# Patient Record
Sex: Male | Born: 1940
Health system: Southern US, Community
[De-identification: ages and names within clinical notes are randomized; demographics above are authoritative.]

## PROBLEM LIST (undated history)

## (undated) DIAGNOSIS — Z789 Other specified health status: Secondary | ICD-10-CM

## (undated) DIAGNOSIS — C801 Malignant (primary) neoplasm, unspecified: Secondary | ICD-10-CM

## (undated) DIAGNOSIS — I4891 Unspecified atrial fibrillation: Secondary | ICD-10-CM

## (undated) DIAGNOSIS — K5730 Diverticulosis of large intestine without perforation or abscess without bleeding: Secondary | ICD-10-CM

## (undated) DIAGNOSIS — C61 Malignant neoplasm of prostate: Secondary | ICD-10-CM

## (undated) DIAGNOSIS — Z8546 Personal history of malignant neoplasm of prostate: Secondary | ICD-10-CM

## (undated) DIAGNOSIS — M1990 Unspecified osteoarthritis, unspecified site: Secondary | ICD-10-CM

## (undated) DIAGNOSIS — Z86010 Personal history of colonic polyps: Secondary | ICD-10-CM

## (undated) DIAGNOSIS — Z923 Personal history of irradiation: Secondary | ICD-10-CM

## (undated) DIAGNOSIS — E785 Hyperlipidemia, unspecified: Secondary | ICD-10-CM

## (undated) DIAGNOSIS — G4730 Sleep apnea, unspecified: Secondary | ICD-10-CM

## (undated) HISTORY — PX: CATARACT EXTRACTION W/ INTRAOCULAR LENS  IMPLANT, BILATERAL: SHX1307

## (undated) HISTORY — DX: Diverticulosis of large intestine without perforation or abscess without bleeding: K57.30

## (undated) HISTORY — DX: Personal history of malignant neoplasm of prostate: Z85.46

## (undated) HISTORY — PX: JOINT REPLACEMENT: SHX530

## (undated) HISTORY — PX: SHOULDER ARTHROSCOPY: SHX128

## (undated) HISTORY — DX: Personal history of colonic polyps: Z86.010

## (undated) HISTORY — DX: Hyperlipidemia, unspecified: E78.5

## (undated) HISTORY — PX: COLONOSCOPY: SHX174

## (undated) HISTORY — DX: Malignant (primary) neoplasm, unspecified: C80.1

---

## 1998-11-16 ENCOUNTER — Other Ambulatory Visit: Admission: RE | Admit: 1998-11-16 | Discharge: 1998-11-16 | Payer: Self-pay | Admitting: Gastroenterology

## 2003-01-23 DIAGNOSIS — Z86010 Personal history of colonic polyps: Secondary | ICD-10-CM

## 2003-01-23 HISTORY — DX: Personal history of colonic polyps: Z86.010

## 2003-02-12 ENCOUNTER — Encounter (INDEPENDENT_AMBULATORY_CARE_PROVIDER_SITE_OTHER): Payer: Self-pay | Admitting: Gastroenterology

## 2004-10-25 ENCOUNTER — Ambulatory Visit: Admission: RE | Admit: 2004-10-25 | Discharge: 2004-11-01 | Payer: Self-pay | Admitting: Radiation Oncology

## 2004-12-28 ENCOUNTER — Ambulatory Visit: Admission: RE | Admit: 2004-12-28 | Discharge: 2005-03-07 | Payer: Self-pay | Admitting: Radiation Oncology

## 2005-03-23 ENCOUNTER — Ambulatory Visit: Admission: RE | Admit: 2005-03-23 | Discharge: 2005-06-15 | Payer: Self-pay | Admitting: Radiation Oncology

## 2006-06-27 ENCOUNTER — Ambulatory Visit: Payer: Self-pay | Admitting: Gastroenterology

## 2006-07-09 ENCOUNTER — Ambulatory Visit: Payer: Self-pay | Admitting: Gastroenterology

## 2008-10-22 ENCOUNTER — Telehealth: Payer: Self-pay | Admitting: Gastroenterology

## 2008-10-23 ENCOUNTER — Ambulatory Visit: Payer: Self-pay | Admitting: Gastroenterology

## 2008-10-23 DIAGNOSIS — K602 Anal fissure, unspecified: Secondary | ICD-10-CM | POA: Insufficient documentation

## 2008-10-23 DIAGNOSIS — K5730 Diverticulosis of large intestine without perforation or abscess without bleeding: Secondary | ICD-10-CM | POA: Insufficient documentation

## 2010-06-21 ENCOUNTER — Ambulatory Visit
Admission: RE | Admit: 2010-06-21 | Discharge: 2010-06-21 | Payer: Self-pay | Source: Home / Self Care | Attending: Surgery | Admitting: Surgery

## 2011-06-09 ENCOUNTER — Ambulatory Visit (INDEPENDENT_AMBULATORY_CARE_PROVIDER_SITE_OTHER): Payer: BC Managed Care – PPO

## 2011-06-09 DIAGNOSIS — J9801 Acute bronchospasm: Secondary | ICD-10-CM

## 2011-06-09 DIAGNOSIS — R05 Cough: Secondary | ICD-10-CM

## 2011-06-09 DIAGNOSIS — C61 Malignant neoplasm of prostate: Secondary | ICD-10-CM

## 2011-06-09 DIAGNOSIS — Z79899 Other long term (current) drug therapy: Secondary | ICD-10-CM

## 2011-09-26 ENCOUNTER — Encounter: Payer: Self-pay | Admitting: Gastroenterology

## 2011-09-26 ENCOUNTER — Ambulatory Visit (INDEPENDENT_AMBULATORY_CARE_PROVIDER_SITE_OTHER): Payer: Self-pay | Admitting: Gastroenterology

## 2011-09-26 VITALS — BP 120/72 | HR 80 | Ht 73.0 in | Wt 220.0 lb

## 2011-09-26 DIAGNOSIS — K5730 Diverticulosis of large intestine without perforation or abscess without bleeding: Secondary | ICD-10-CM

## 2011-09-26 DIAGNOSIS — Z8371 Family history of colonic polyps: Secondary | ICD-10-CM

## 2011-09-26 DIAGNOSIS — Z860100 Personal history of colon polyps, unspecified: Secondary | ICD-10-CM

## 2011-09-26 DIAGNOSIS — Z83719 Family history of colon polyps, unspecified: Secondary | ICD-10-CM

## 2011-09-26 MED ORDER — MOVIPREP 100 G PO SOLR: 1.0000 | Freq: Once | ORAL | Status: DC

## 2011-09-26 NOTE — Progress Notes (Signed)
History of Present Illness:  This is a 71 year old Caucasian male with a past history of colonic polyposis, currently asymptomatic, denying rectal bleeding, bowel irregularity, or abdominal pain. Appetite is good and his weight is stable. He denies upper gastrointestinal or hepatobiliary or general medical problems. Laboratory data routinely done and reportedly normal. He has had previous right hip replacement. Last colonoscopy in 2008 with Dr.Wilberforce revealed diverticulosis but no colonic polyposis.  I have reviewed this patient's present history, medical and surgical past history, allergies and medications.     ROS: The remainder of the 10 point ROS is negative     Physical Exam: Blood pressure 120/72, pulse 80 and regular, and BMI 29.03. General well developed well nourished patient in no acute distress, appearing his stated age Eyes PERRLA, no icterus, fundoscopic exam per opthamologist Skin no lesions noted Neck supple, no adenopathy, no thyroid enlargement, no tenderness Chest clear to percussion and auscultation Heart no significant murmurs, gallops or rubs noted Abdomen no hepatosplenomegaly masses or tenderness, BS normal. Ventral hernia noted. There is no evidence of incarceration, masses or tenderness. Extremities no acute joint lesions, edema, phlebitis or evidence of cellulitis. Neurologic patient oriented x 3, cranial nerves intact, no focal neurologic deficits noted. Psychological mental status normal and normal affect.  Assessment and plan: Apparently this patient's mother also had colon polyps. I've scheduled him for followup colonoscopy with propofol sedation at his convenience. Otherwise he is to continue medications as listed and reviewed.  Encounter Diagnoses  Name Primary?  . Personal history of colonic polyps Yes  . Diverticulosis of colon (without mention of hemorrhage)

## 2011-09-26 NOTE — Patient Instructions (Addendum)
Your procedure has been scheduled for 10/05/2011, please follow the seperate instructions.  Your prescription(s) have been sent to you pharmacy.

## 2011-09-29 ENCOUNTER — Encounter: Payer: Self-pay | Admitting: Gastroenterology

## 2011-10-05 ENCOUNTER — Ambulatory Visit (AMBULATORY_SURGERY_CENTER): Payer: BC Managed Care – PPO | Admitting: Gastroenterology

## 2011-10-05 ENCOUNTER — Encounter: Payer: Self-pay | Admitting: Gastroenterology

## 2011-10-05 VITALS — BP 130/78 | HR 69 | Temp 96.5°F | Resp 19 | Ht 73.0 in | Wt 220.0 lb

## 2011-10-05 DIAGNOSIS — Z86010 Personal history of colonic polyps: Secondary | ICD-10-CM

## 2011-10-05 DIAGNOSIS — K5730 Diverticulosis of large intestine without perforation or abscess without bleeding: Secondary | ICD-10-CM

## 2011-10-05 DIAGNOSIS — D126 Benign neoplasm of colon, unspecified: Secondary | ICD-10-CM

## 2011-10-05 DIAGNOSIS — D128 Benign neoplasm of rectum: Secondary | ICD-10-CM

## 2011-10-05 DIAGNOSIS — D129 Benign neoplasm of anus and anal canal: Secondary | ICD-10-CM

## 2011-10-05 MED ORDER — SODIUM CHLORIDE 0.9 % IV SOLN: 500.0000 mL | INTRAVENOUS | Status: DC

## 2011-10-05 NOTE — Progress Notes (Signed)
The pt tolerated the colonoscopy very well. Maw   

## 2011-10-05 NOTE — Progress Notes (Signed)
Patient did not experience any of the following events: a burn prior to discharge; a fall within the facility; wrong site/side/patient/procedure/implant event; or a hospital transfer or hospital admission upon discharge from the facility. (G8907) Patient did not have preoperative order for IV antibiotic SSI prophylaxis. (G8918)  

## 2011-10-05 NOTE — Op Note (Signed)
[facility] [address] [place], [place]  [address]  COLONOSCOPY PROCEDURE REPORT  PATIENT:  Shawn Bell, Shawn Bell  MR#:  [id number] BIRTHDATE:  21-Oct-1940, 71 yrs. old  GENDER:  male ENDOSCOPIST:  Vania Rea. Jarold Motto, MD, [facility] REF. BY: PROCEDURE DATE:  10/05/2011 PROCEDURE:  Colonoscopy with biopsy ASA CLASS:  Class II INDICATIONS:  history of polyps MEDICATIONS:   propofol (Diprivan) 250 mg IV  DESCRIPTION OF PROCEDURE:   After the risks and benefits and of the procedure were explained, informed consent was obtained. Digital rectal exam was performed and revealed no abnormalities. The LB 180AL [id number] endoscope was introduced through the anus and advanced to the cecum, which was identified by both the appendix and ileocecal valve.  The quality of the prep was excellent, using MoviPrep.  The instrument was then slowly withdrawn as the colon was fully examined. <<PROCEDUREIMAGES>>  FINDINGS:  There were mild diverticular changes in left colon. diverticulosis was found.  A lipoma was found in the sigmoid colon.  Two polyps were found in the rectum. 2 small sessile rectal polyps cold biopsy removed  This was otherwise a normal examination of the colon.   Retroflexed views in the rectum revealed no abnormalities.    The scope was then withdrawn from the patient and the procedure completed.  COMPLICATIONS:  None ENDOSCOPIC IMPRESSION: 1) Diverticulosis,mild,left sided diverticulosis 2) Lipoma in the sigmoid colon 3) Two polyps in the rectum 4) Otherwise normal examination R/O ADENOMA RECOMMENDATIONS: 1) Repeat colonoscopy in 5 years if polyp adenomatous; otherwise 10 years 2) High fiber diet.  REPEAT EXAM:  No  ______________________________ Vania Rea. Jarold Motto, MD, [facility]  CC:  Robert Bellow, MD  n. Rosalie Doctor:   Vania Rea. Lateya Dauria at 10/05/2011 02:02 PM  Franne Forts, [phone]

## 2011-10-05 NOTE — Patient Instructions (Signed)

## 2011-10-06 ENCOUNTER — Telehealth: Payer: Self-pay

## 2011-10-06 NOTE — Telephone Encounter (Signed)
Left a message on the pt's answering machine [phone] for the pt to call if any questions or concerns. Maw

## 2011-10-11 ENCOUNTER — Encounter: Payer: Self-pay | Admitting: Gastroenterology

## 2011-12-07 ENCOUNTER — Encounter: Payer: Self-pay | Admitting: Internal Medicine

## 2012-01-02 ENCOUNTER — Ambulatory Visit: Payer: BC Managed Care – PPO

## 2012-01-02 ENCOUNTER — Ambulatory Visit (INDEPENDENT_AMBULATORY_CARE_PROVIDER_SITE_OTHER): Payer: BC Managed Care – PPO | Admitting: Internal Medicine

## 2012-01-02 VITALS — BP 132/80 | HR 77 | Temp 97.0°F | Resp 18 | Ht 71.0 in | Wt 221.0 lb

## 2012-01-02 DIAGNOSIS — Z8546 Personal history of malignant neoplasm of prostate: Secondary | ICD-10-CM

## 2012-01-02 DIAGNOSIS — R195 Other fecal abnormalities: Secondary | ICD-10-CM

## 2012-01-02 DIAGNOSIS — C61 Malignant neoplasm of prostate: Secondary | ICD-10-CM

## 2012-01-02 DIAGNOSIS — Z0000 Encounter for general adult medical examination without abnormal findings: Secondary | ICD-10-CM

## 2012-01-02 DIAGNOSIS — E789 Disorder of lipoprotein metabolism, unspecified: Secondary | ICD-10-CM | POA: Insufficient documentation

## 2012-01-02 DIAGNOSIS — E785 Hyperlipidemia, unspecified: Secondary | ICD-10-CM

## 2012-01-02 DIAGNOSIS — E782 Mixed hyperlipidemia: Secondary | ICD-10-CM

## 2012-01-02 LAB — POCT URINALYSIS DIPSTICK
Ketones, UA: NEGATIVE
Protein, UA: NEGATIVE
Spec Grav, UA: 1.025
Urobilinogen, UA: 0.2
pH, UA: 5.5

## 2012-01-02 LAB — POCT UA - MICROSCOPIC ONLY
Bacteria, U Microscopic: NEGATIVE
Casts, Ur, LPF, POC: NEGATIVE
Crystals, Ur, HPF, POC: NEGATIVE
RBC, urine, microscopic: NEGATIVE

## 2012-01-02 LAB — CBC WITH DIFFERENTIAL/PLATELET
Basophils Absolute: 0 10*3/uL (ref 0.0–0.1)
Basophils Relative: 1 % (ref 0–1)
Eosinophils Absolute: 0.3 10*3/uL (ref 0.0–0.7)
Hemoglobin: 15 g/dL (ref 13.0–17.0)
MCH: 31.8 pg (ref 26.0–34.0)
MCHC: 34.6 g/dL (ref 30.0–36.0)
Monocytes Relative: 9 % (ref 3–12)
Neutro Abs: 3.3 10*3/uL (ref 1.7–7.7)
Neutrophils Relative %: 61 % (ref 43–77)
Platelets: 288 10*3/uL (ref 150–400)
RDW: 13.4 % (ref 11.5–15.5)

## 2012-01-02 LAB — LIPID PANEL
Cholesterol: 156 mg/dL (ref 0–200)
Total CHOL/HDL Ratio: 4.7 Ratio

## 2012-01-02 LAB — COMPREHENSIVE METABOLIC PANEL
AST: 19 U/L (ref 0–37)
Albumin: 4.5 g/dL (ref 3.5–5.2)
Alkaline Phosphatase: 48 U/L (ref 39–117)
BUN: 16 mg/dL (ref 6–23)
Calcium: 9.4 mg/dL (ref 8.4–10.5)
Chloride: 104 mEq/L (ref 96–112)
Potassium: 3.9 mEq/L (ref 3.5–5.3)
Sodium: 139 mEq/L (ref 135–145)
Total Protein: 6.9 g/dL (ref 6.0–8.3)

## 2012-01-02 NOTE — Progress Notes (Signed)
  Subjective:    Patient ID: Shawn Bell, male    DOB: 05/09/1941, 71 y.o.   MRN: [id number]  HPI Feels good. Scheduled for total hip September. Lipids are controlled with crestor. Prostate cancer appears controlled. See scanned hx   Review of Systems See scanned ros    Objective:   Physical Exam Normal head to toe  Hemosure positive  [facility] reading (PRIMARY) by  Dr.Ines Warf cxr ok, has cough ekg ok Results for orders placed in visit on 01/02/12  POCT UA - MICROSCOPIC ONLY      Component Value Range   WBC, Ur, HPF, POC neg     RBC, urine, microscopic neg     Bacteria, U Microscopic neg     Mucus, UA neg     Epithelial cells, urine per micros 0-3     Crystals, Ur, HPF, POC neg     Casts, Ur, LPF, POC neg     Yeast, UA neg    POCT URINALYSIS DIPSTICK      Component Value Range   Color, UA yellow     Clarity, UA clear     Glucose, UA neg     Bilirubin, UA neg     Ketones, UA neg     Spec Grav, UA 1.025     Blood, UA neg     pH, UA 5.5     Protein, UA neg     Urobilinogen, UA 0.2     Nitrite, UA neg     Leukocytes, UA Negative    IFOBT (OCCULT BLOOD)      Component Value Range   IFOBT Positive     .         Assessment & Plan:  Repeat hemosure 1 mo. See GI for endoscopy consideration

## 2012-01-03 LAB — TSH: TSH: 1.869 u[IU]/mL (ref 0.350–4.500)

## 2012-01-04 ENCOUNTER — Encounter: Payer: Self-pay | Admitting: Internal Medicine

## 2012-01-28 ENCOUNTER — Emergency Department (HOSPITAL_COMMUNITY): Payer: BC Managed Care – PPO

## 2012-01-28 ENCOUNTER — Emergency Department (HOSPITAL_COMMUNITY)
Admission: EM | Admit: 2012-01-28 | Discharge: 2012-01-28 | Disposition: A | Discharge status: Home / Self Care | Payer: BC Managed Care – PPO | Attending: Emergency Medicine | Admitting: Emergency Medicine

## 2012-01-28 ENCOUNTER — Encounter (HOSPITAL_COMMUNITY): Payer: Self-pay | Admitting: Emergency Medicine

## 2012-01-28 DIAGNOSIS — R109 Unspecified abdominal pain: Secondary | ICD-10-CM | POA: Insufficient documentation

## 2012-01-28 DIAGNOSIS — Z7982 Long term (current) use of aspirin: Secondary | ICD-10-CM | POA: Insufficient documentation

## 2012-01-28 DIAGNOSIS — K8020 Calculus of gallbladder without cholecystitis without obstruction: Secondary | ICD-10-CM

## 2012-01-28 DIAGNOSIS — E785 Hyperlipidemia, unspecified: Secondary | ICD-10-CM | POA: Insufficient documentation

## 2012-01-28 DIAGNOSIS — Z8546 Personal history of malignant neoplasm of prostate: Secondary | ICD-10-CM | POA: Insufficient documentation

## 2012-01-28 LAB — CBC WITH DIFFERENTIAL/PLATELET
HCT: 43.6 % (ref 39.0–52.0)
Hemoglobin: 15.3 g/dL (ref 13.0–17.0)
Lymphocytes Relative: 13 % (ref 12–46)
Monocytes Absolute: 0.4 10*3/uL (ref 0.1–1.0)
Monocytes Relative: 5 % (ref 3–12)
Neutro Abs: 6.6 10*3/uL (ref 1.7–7.7)
RBC: 4.72 MIL/uL (ref 4.22–5.81)
WBC: 8.3 10*3/uL (ref 4.0–10.5)

## 2012-01-28 LAB — POCT I-STAT TROPONIN I: Troponin i, poc: 0 ng/mL (ref 0.00–0.08)

## 2012-01-28 LAB — COMPREHENSIVE METABOLIC PANEL
AST: 20 U/L (ref 0–37)
Alkaline Phosphatase: 48 U/L (ref 39–117)
BUN: 14 mg/dL (ref 6–23)
CO2: 27 mEq/L (ref 19–32)
Chloride: 100 mEq/L (ref 96–112)
Creatinine, Ser: 1.13 mg/dL (ref 0.50–1.35)
GFR calc non Af Amer: 63 mL/min — ABNORMAL LOW (ref >90)
Potassium: 4.2 mEq/L (ref 3.5–5.1)
Total Bilirubin: 0.5 mg/dL (ref 0.3–1.2)

## 2012-01-28 MED ORDER — ONDANSETRON HCL 8 MG PO TABS: 8.0000 mg | ORAL_TABLET | Freq: Four times a day (QID) | ORAL | Status: AC

## 2012-01-28 MED ORDER — ASPIRIN 81 MG PO CHEW
CHEWABLE_TABLET | ORAL | Status: AC
  Administered 2012-01-28: 243 mg via ORAL
  Filled 2012-01-28: qty 3

## 2012-01-28 MED ORDER — HYDROMORPHONE HCL PF 1 MG/ML IJ SOLN
1.0000 mg | Freq: Once | INTRAMUSCULAR | Status: AC
  Administered 2012-01-28: 1 mg via INTRAVENOUS
  Filled 2012-01-28: qty 1

## 2012-01-28 MED ORDER — ONDANSETRON HCL 4 MG/2ML IJ SOLN
4.0000 mg | Freq: Once | INTRAMUSCULAR | Status: AC
  Administered 2012-01-28: 4 mg via INTRAVENOUS
  Filled 2012-01-28: qty 2

## 2012-01-28 MED ORDER — HYDROMORPHONE HCL PF 1 MG/ML IJ SOLN
INTRAMUSCULAR | Status: AC
  Administered 2012-01-28: 1 mg
  Filled 2012-01-28: qty 1

## 2012-01-28 MED ORDER — MORPHINE SULFATE 4 MG/ML IJ SOLN
4.0000 mg | Freq: Once | INTRAMUSCULAR | Status: AC
  Administered 2012-01-28: 4 mg via INTRAVENOUS
  Filled 2012-01-28: qty 1

## 2012-01-28 MED ORDER — ASPIRIN 81 MG PO CHEW
243.0000 mg | CHEWABLE_TABLET | Freq: Once | ORAL | Status: AC
  Administered 2012-01-28: 243 mg via ORAL
  Filled 2012-01-28: qty 3

## 2012-01-28 MED ORDER — NITROGLYCERIN 0.4 MG SL SUBL
SUBLINGUAL_TABLET | SUBLINGUAL | Status: AC
  Administered 2012-01-28: 19:00:00
  Filled 2012-01-28: qty 25

## 2012-01-28 MED ORDER — PERCOCET 5-325 MG PO TABS: ORAL_TABLET | ORAL | Status: DC

## 2012-01-28 NOTE — ED Notes (Signed)
Patient given discharge instructions, information, prescriptions, and diet order. Patient states that they adequately understand discharge information given and to return to ED if symptoms return or worsen.     

## 2012-01-28 NOTE — ED Notes (Signed)
Bed:WA04<BR> Expected date:<BR> Expected time:<BR> Means of arrival:<BR> Comments:<BR> Closed

## 2012-01-28 NOTE — ED Notes (Addendum)
Pt transferred to room 14 approximately 20 minutes ago. Sts he has had aspirin, nitro and viagra earlier today. Patient on monitor at this time and cycling BP x 10 minutes. Patient bolusing at this time with NS. Patient sts his pain has decreased to 6/10 from 10/10. Sts his epigastric region is painful, no masses palpated, more pain upon palpation. Denies any hx of ulcers, heartburn or cardiac problems.

## 2012-01-28 NOTE — ED Notes (Signed)
Pt w/ onset of chest pain mid sternal radiating thru to back. Self medicated Zantac, Tums, and gas-x as he thought it was indigestion. Did not have any relief. Pt self medicated at home w/ 81 mg ASA

## 2012-01-28 NOTE — ED Notes (Signed)
Pt wife leaving bedside to home. Left her phone number, cell: [phone]. Call for any changes in status or when patient is ready to go home.

## 2012-01-28 NOTE — ED Provider Notes (Signed)
History     CSN: [id number]  Arrival date & time 01/28/12  1840   First MD Initiated Contact with Patient 01/28/12 1937      Chief Complaint  Patient presents with  . Chest Pain   Level V caveat for urgent intervention for pain  (Consider location/radiation/quality/duration/timing/severity/associated sxs/prior treatment) HPI  Patient relates about 4 PM he had acute onset of epigastric abdominal pain that radiates into his back. He states it's constant and is "intense". Patient is unable to clarify his pain he's not sure if he's had nausea or not. He states he had a similar pain about a month ago and he took Zantac for it and it seemed to help.  During the course of our interview patient did reveal he had Viagra at about 1 PM. He had been given nitroglycerin by the triage nurse.  PCP Dr. Perrin Maltese  Past Medical History  Diagnosis Date  . Diverticulosis of colon (without mention of hemorrhage)   . Hemorrhoids   . Personal history of colonic polyps 01/23/2003  . Hyperlipemia   . History of prostate cancer   . Cancer     Past Surgical History  Procedure Date  . Total hip arthroplasty     right  . Shoulder arthroscopy   . Joint replacement     Family History  Problem Relation Age of Onset  . Colon cancer Neg Hx   . Colon polyps Neg Hx   . Rectal cancer Neg Hx   . Stomach cancer Neg Hx    mother of patient died age 44 she had a abdominal aortic aneurysm that was stented  History  Substance Use Topics  . Smoking status: Never Smoker   . Smokeless tobacco: Never Used  . Alcohol Use: Yes     wine daily -red  lives with spouse    Review of Systems  Unable to perform ROS: Other  All other systems reviewed and are negative.    Allergies  Review of patient's allergies indicates no known allergies.  Home Medications   Current Outpatient Rx  Name Route Sig Dispense Refill  . AMBULATORY NON FORMULARY MEDICATION  Activated [employer] as directed    . ASPIRIN 81  MG PO TABS Oral Take 81 mg by mouth daily.    . B COMPLEX PO TABS Oral Take 1 tablet by mouth daily.    Marland Kitchen FIBERCON PO Oral Take by mouth as directed.    . COQ-10 150 MG PO CAPS Oral Take 1 capsule by mouth daily.    . CENTRUM SILVER PO Oral Take 1 tablet by mouth daily.    Marland Kitchen ROSUVASTATIN CALCIUM 5 MG PO TABS Oral Take 5 mg by mouth daily.      Dispense as written.  Viagra, last dose after lunch  BP 132/83  Pulse 89  Temp 97.7 F (36.5 C) (Oral)  Resp 19  SpO2 98%  Vital signs normal    Physical Exam  Nursing note and vitals reviewed. Constitutional: He is oriented to person, place, and time. He appears well-developed and well-nourished.  Non-toxic appearance. He does not appear ill. No distress.  HENT:  Head: Normocephalic and atraumatic.  Right Ear: External ear normal.  Left Ear: External ear normal.  Nose: Nose normal. No mucosal edema or rhinorrhea.  Mouth/Throat: Oropharynx is clear and moist and mucous membranes are normal. No dental abscesses or uvula swelling.  Eyes: Conjunctivae and EOM are normal. Pupils are equal, round, and reactive to light.  Neck: Normal  range of motion and full passive range of motion without pain. Neck supple.  Cardiovascular: Normal rate, regular rhythm and normal heart sounds.  Exam reveals no gallop and no friction rub.   No murmur heard. Pulmonary/Chest: Effort normal and breath sounds normal. No respiratory distress. He has no wheezes. He has no rhonchi. He has no rales. He exhibits no tenderness and no crepitus.  Abdominal: Soft. Normal appearance and bowel sounds are normal. He exhibits no distension and no mass. There is tenderness in the epigastric area. There is no rebound and no guarding.    Musculoskeletal: Normal range of motion. He exhibits no edema and no tenderness.       Moves all extremities well.   Neurological: He is alert and oriented to person, place, and time. He has normal strength. No cranial nerve deficit.  Skin: Skin  is warm, dry and intact. No rash noted. No erythema. No pallor.  Psychiatric: He has a normal mood and affect. His speech is normal and behavior is normal. His mood appears not anxious.    ED Course  Procedures (including critical care time)   Medications  nitroGLYCERIN (NITROSTAT) 0.4 MG SL tablet (   Given 01/28/12 1857)  aspirin chewable tablet 243 mg (243 mg Oral Given 01/28/12 1901)  HYDROmorphone (DILAUDID) injection 1 mg (1 mg Intravenous Given 01/28/12 2002)  ondansetron (ZOFRAN) injection 4 mg (4 mg Intravenous Given 01/28/12 2002)  HYDROmorphone (DILAUDID) 1 MG/ML injection (1 mg  Given 01/28/12 2043)  morphine 4 MG/ML injection 4 mg (4 mg Intravenous Given 01/28/12 2241)   Pt given results of his tests and need to follow low fat diet. Pt states he is having hip replacement in September and wants to have his surgery before that.   Results for orders placed during the hospital encounter of 01/28/12  CBC WITH DIFFERENTIAL      Component Value Range   WBC 8.3  4.0 - 10.5 K/uL   RBC 4.72  4.22 - 5.81 MIL/uL   Hemoglobin 15.3  13.0 - 17.0 g/dL   HCT 14.7  82.9 - 56.2 %   MCV 92.4  78.0 - 100.0 fL   MCH 32.4  26.0 - 34.0 pg   MCHC 35.1  30.0 - 36.0 g/dL   RDW 13.0  86.5 - 78.4 %   Platelets 237  150 - 400 K/uL   Neutrophils Relative 80 (*) 43 - 77 %   Neutro Abs 6.6  1.7 - 7.7 K/uL   Lymphocytes Relative 13  12 - 46 %   Lymphs Abs 1.1  0.7 - 4.0 K/uL   Monocytes Relative 5  3 - 12 %   Monocytes Absolute 0.4  0.1 - 1.0 K/uL   Eosinophils Relative 1  0 - 5 %   Eosinophils Absolute 0.1  0.0 - 0.7 K/uL   Basophils Relative 0  0 - 1 %   Basophils Absolute 0.0  0.0 - 0.1 K/uL  COMPREHENSIVE METABOLIC PANEL      Component Value Range   Sodium 138  135 - 145 mEq/L   Potassium 4.2  3.5 - 5.1 mEq/L   Chloride 100  96 - 112 mEq/L   CO2 27  19 - 32 mEq/L   Glucose, Bld 190 (*) 70 - 99 mg/dL   BUN 14  6 - 23 mg/dL   Creatinine, Ser 6.96  0.50 - 1.35 mg/dL   Calcium 9.5  8.4 - 29.5  mg/dL   Total Protein 7.0  6.0 -  8.3 g/dL   Albumin 4.3  3.5 - 5.2 g/dL   AST 20  0 - 37 U/L   ALT 16  0 - 53 U/L   Alkaline Phosphatase 48  39 - 117 U/L   Total Bilirubin 0.5  0.3 - 1.2 mg/dL   GFR calc non Af Amer 63 (*) >90 mL/min   GFR calc Af Amer 73 (*) >90 mL/min  POCT I-STAT TROPONIN I      Component Value Range   Troponin i, poc 0.00  0.00 - 0.08 ng/mL   Comment 3           LIPASE, BLOOD      Component Value Range   Lipase 35  11 - 59 U/L  POCT I-STAT TROPONIN I      Component Value Range   Troponin i, poc 0.00  0.00 - 0.08 ng/mL   Comment 3            Laboratory interpretation all normal except for hyperglycemia  Ct Abdomen Pelvis Wo Contrast  01/28/2012  *RADIOLOGY REPORT*  Clinical Data: Abdominal pain radiating to the back.  Abdominal aortic aneurysm.  CT ABDOMEN AND PELVIS WITHOUT CONTRAST  Technique:  Multidetector CT imaging of the abdomen and pelvis was performed following the standard protocol without intravenous contrast.  Comparison: None.  Findings: Lung Bases: Dependent atelectasis at the lung bases. Coronary artery and descending thoracic aortic atherosclerosis.  Liver:  Unenhanced CT was performed per clinician order.  Lack of IV contrast limits sensitivity and specificity, especially for evaluation of abdominal/pelvic solid viscera.  Low density sub centimeter lesion in the left hepatic lobe, probably a cyst.  No biliary ductal dilation.  Spleen:  Normal.  Gallbladder:  Cholelithiasis is present.  Largest stone measures 27 mm and is partially calcified.  No CT evidence of acute cholecystitis.  Common bile duct:  Normal.  Pancreas:  Normal.  Adrenal glands:  Normal.  Kidneys:  Bilateral renal low density lesions, most compatible with cysts.  No stones or obstruction.  The ureters are within normal limits.  Stomach:  Normal appearance of the stomach.  Small bowel:  Within normal limits.  No small bowel mesenteric adenopathy.  Colon:   Colonic diverticulosis without  diverticulitis.  Pelvic Genitourinary:  Pelvis partially obscured by right total hip arthroplasty.  Prostate brachytherapy seeds.  No free fluid. Prostate produces an impression on the inferior urinary bladder.  Urinary bladder is markedly distended.  The patient may benefit from Foley catheter placement.  This raises possibility of urinary retention or bladder outflow obstruction.  Bones:  No aggressive osseous lesions.  Severe left hip osteoarthritis with subchondral cystic changes on both sides of the joint.  The right sacroiliac joint degenerative disease.  Lumbar spondylosis and scoliosis.  Right supra-acetabular ilium postoperative changes.  Vasculature: Severe aortoiliac atherosclerosis is present without aneurysm.  The common iliac arteries measure up to 18 mm.  No acute vascular abnormality is identified.  On sagittal imaging, the long axis of the bladder is 20.7 cm.  IMPRESSION: 1.  Marked distention of the urinary bladder.  Question urinary retention versus bladder outflow obstruction. The patient may benefit from Foley catheter placement. 2.  Atherosclerosis without acute vascular abnormality.  No abdominal aortic aneurysm. 3.  Renal and hepatic cysts. 4.  Cholelithiasis. 5.  Coronary artery disease.  Original Report Authenticated By: Andreas Newport, M.D.   Dg Chest Portable 1 View  01/28/2012  *RADIOLOGY REPORT*  Clinical Data: Chest and epigastric pain  PORTABLE  CHEST - 1 VIEW  Comparison: 01/02/2012  Findings: Slightly lower lung volumes. Lungs clear.  Heart size and pulmonary vascularity normal.  No effusion.  Degenerative changes in bilateral shoulders.  There is widening of the right AC joint.  IMPRESSION: No acute disease  Original Report Authenticated By: Osa Craver, M.D.       Date: 01/28/2012  Rate: 78  Rhythm: normal sinus rhythm  QRS Axis: normal  Intervals: normal  ST/T Wave abnormalities: normal  Conduction Disutrbances:IRBBB  Narrative Interpretation:   Old EKG  Reviewed: none available    1. Abdominal pain   2. Gallstones    Patient's Medications  New Prescriptions   ONDANSETRON (ZOFRAN) 8 MG TABLET    Take 1 tablet (8 mg total) by mouth every 6 (six) hours.   PERCOCET 5-325 MG PER TABLET    Take 1 or 2 po Q 6hrs for pain    Plan discharge  Devoria Albe, MD, FACEP    MDM          Ward Givens, MD 01/28/12 [phone]

## 2012-01-29 ENCOUNTER — Other Ambulatory Visit: Payer: Self-pay | Admitting: Internal Medicine

## 2012-01-29 ENCOUNTER — Telehealth (INDEPENDENT_AMBULATORY_CARE_PROVIDER_SITE_OTHER): Payer: Self-pay | Admitting: General Surgery

## 2012-01-29 DIAGNOSIS — R109 Unspecified abdominal pain: Secondary | ICD-10-CM

## 2012-01-29 DIAGNOSIS — K8020 Calculus of gallbladder without cholecystitis without obstruction: Secondary | ICD-10-CM

## 2012-01-29 NOTE — Telephone Encounter (Signed)
Patient has appt for new gallbladder evaluation on Thursday 02/01/12. He was in the ER last night. Dr Robert Bellow is asking if Dr Gerrit Friends can see patient tomorrow. I told him I saw no appts available. I offered another MD and he declined. Dr Gerrit Friends, please call Dr Perrin Maltese to discuss. [phone].

## 2012-01-30 ENCOUNTER — Encounter (INDEPENDENT_AMBULATORY_CARE_PROVIDER_SITE_OTHER): Payer: Self-pay | Admitting: Surgery

## 2012-01-30 ENCOUNTER — Ambulatory Visit (INDEPENDENT_AMBULATORY_CARE_PROVIDER_SITE_OTHER): Payer: BC Managed Care – PPO | Admitting: Surgery

## 2012-01-30 VITALS — BP 124/76 | HR 97 | Temp 97.8°F | Ht 73.0 in | Wt 218.8 lb

## 2012-01-30 DIAGNOSIS — K8010 Calculus of gallbladder with chronic cholecystitis without obstruction: Secondary | ICD-10-CM | POA: Insufficient documentation

## 2012-01-30 NOTE — Progress Notes (Signed)
General Surgery - [facility]  Chief Complaint  Patient presents with  . New Evaluation    eval gallbladder with stones - referral from Dr. Chris Guest    HISTORY: Patient is a 71-year-old white male referred by his primary care physician in the emergency department for symptomatic cholelithiasis. The patient had an initial episode of epigastric abdominal pain approximately one month ago. This lasted for approximately one hour. He denies nausea or vomiting. He denies fever. He denies jaundice. He denies acholic stools. Two days ago the patient experienced significant epigastric abdominal pain which was unrelenting.  He presented to the emergency department where he was evaluated. Cardiac issues were out. Laboratory studies showed normal liver function test. CT scan of the abdomen and pelvis demonstrated a large 27 mm gallstone within the gallbladder. No acute inflammatory findings were noted. The remainder of the study was essentially normal.  Patient has no prior history of upper abdominal surgery.  Past Medical History  Diagnosis Date  . Diverticulosis of colon (without mention of hemorrhage)   . Hemorrhoids   . Personal history of colonic polyps 01/23/2003  . Hyperlipemia   . History of prostate cancer   . Cancer      Current Outpatient Prescriptions  Medication Sig Dispense Refill  . AMBULATORY NON FORMULARY MEDICATION Activated Queretin Takes as directed      . aspirin 81 MG tablet Take 81 mg by mouth daily.      . b complex vitamins tablet Take 1 tablet by mouth daily.      . Calcium Polycarbophil (FIBERCON PO) Take by mouth as directed.      . Coenzyme Q10 (COQ-10) 150 MG CAPS Take 1 capsule by mouth daily.      . Multiple Vitamins-Minerals (CENTRUM SILVER PO) Take 1 tablet by mouth daily.      . ondansetron (ZOFRAN) 8 MG tablet Take 1 tablet (8 mg total) by mouth every 6 (six) hours.  8 tablet  0  . PERCOCET 5-325 MG per tablet Take 1 or 2 po Q 6hrs for pain   15 tablet  0  . rosuvastatin (CRESTOR) 5 MG tablet Take 5 mg by mouth daily.         No Known Allergies   Family History  Problem Relation Age of Onset  . Colon cancer Neg Hx   . Colon polyps Neg Hx   . Rectal cancer Neg Hx   . Stomach cancer Neg Hx   . Heart failure Mother      History   Social History  . Marital Status: Married    Spouse Name: N/A    Number of Children: N/A  . Years of Education: N/A   Occupational History  . Owner [employer]   Social History Main Topics  . Smoking status: Never Smoker   . Smokeless tobacco: Never Used  . Alcohol Use: Yes     wine daily -red  . Drug Use: No  . Sexually Active: None   Other Topics Concern  . None   Social History Narrative  . None     REVIEW OF SYSTEMS - PERTINENT POSITIVES ONLY: Denies jaundice. Denies acholic stools. Denies nausea and vomiting. Denies fever.  EXAM: Filed Vitals:   01/30/12 1630  BP: 124/76  Pulse: 97  Temp: 97.8 F (36.6 C)    HEENT: normocephalic; pupils equal and reactive; sclerae clear; dentition good; mucous membranes moist NECK:  symmetric on extension; no palpable anterior or posterior cervical   lymphadenopathy; no supraclavicular masses; no tenderness CHEST: clear to auscultation bilaterally without rales, rhonchi, or wheezes CARDIAC: regular rate and rhythm without significant murmur; peripheral pulses are full ABDOMEN: soft without distension; bowel sounds present; no mass; no hepatosplenomegaly; no hernia; mild epigastric tenderness EXT:  non-tender without edema; no deformity NEURO: no gross focal deficits; no sign of tremor   LABORATORY RESULTS: See [facility] ([facility]L-Epic) for most recent results   RADIOLOGY RESULTS: See [facility] (CHL-Epic) for most recent results   IMPRESSION: Symptomatic cholelithiasis  PLAN: The patient and I discussed laparoscopic cholecystectomy at length. I provided him with written ligature to review. We reviewed the  results of his CT scan and his laboratory studies.  I believe the patient is a good candidate for laparoscopic cholecystectomy. We discussed potential complications including the possibility of conversion to open surgery. We discussed the hospital stay to be anticipated and has returned to work and physical activities. He understands and wishes to proceed in the near future.  The risks and benefits of the procedure have been discussed at length with the patient.  The patient understands the proposed procedure, potential alternative treatments, and the course of recovery to be expected.  All of the patient's questions have been answered at this time.  The patient wishes to proceed with surgery.  Jamayia Croker M. Danissa Rundle, MD, FACS General & Endocrine Surgery [facility]   Visit Diagnoses: 1. Cholelithiasis with cholecystitis     Primary Care Physician: GUEST, CHRIS WARREN, MD   

## 2012-01-30 NOTE — Patient Instructions (Signed)
[facility] LAPAROSCOPIC SURGERY: POST OP INSTRUCTIONS  Always review your discharge instruction sheet given to you by the facility where your surgery was performed.  1. A prescription for pain medication may be given to you upon discharge.  Take your pain medication as prescribed.  If narcotic pain medicine is not needed, then you may take acetaminophen (Tylenol) or ibuprofen (Advil) as needed. 2. Take your usually prescribed medications unless otherwise directed. 3. If you need a refill on your pain medication, please contact your pharmacy.  They will contact our office to request authorization. Prescriptions will not be filled after 5 P.M. or on weekends. 4. You should follow a light diet the first few days after arrival home, such as soup and crackers or toast.  Be sure to include plenty of fluids daily. 5. Most patients will experience some swelling and bruising in the area of the incisions.  Ice packs will help.  Swelling and bruising can take several days to resolve.  6. It is common to experience some constipation if taking pain medication after surgery.  Increasing fluid intake and taking a stool softener (such as Colace) will usually help or prevent this problem from occurring.  A mild laxative (Milk of Magnesia or Miralax) should be taken according to package instructions if there are no bowel movements after 48 hours. 7. Unless discharge instructions indicate otherwise, you may remove your bandages 24-48 hours after surgery, and you may shower at that time.  You may have steri-strips (small skin tapes) in place directly over the incision.  These strips should be left on the skin for 7-10 days.  If your surgeon used skin glue on the incision, you may shower in 24 hours.  The glue will flake off over the next 2-3 weeks.  Any sutures or staples will be removed at the office during your follow-up visit. 8. ACTIVITIES:  You may resume regular (light) daily activities beginning  the next day-such as daily self-care, walking, climbing stairs-gradually increasing activities as tolerated.  You may have sexual intercourse when it is comfortable.  Refrain from any heavy lifting or straining until approved by your doctor. 9. You may drive when you are no longer taking prescription pain medication, you can comfortably wear a seatbelt, and you can safely maneuver your car and apply brakes. 10. You should see your doctor in the office for a follow-up appointment approximately 2-3 weeks after your surgery.  Make sure that you call for this appointment within a day or two after you arrive home to insure a convenient appointment time.  WHEN TO CALL YOUR DOCTOR: 1. Fever over 101.0 2. Inability to urinate 3. Continued bleeding from incision 4. Increased pain, redness, or drainage from the incision 5. Increasing abdominal pain  The clinic staff is available to answer your questions during regular business hours.  Please don't hesitate to call and ask to speak to one of the nurses for clinical concerns.  If you have a medical emergency, go to the nearest emergency room or call 911.  A surgeon from [facility] is always on call for the hospital. [phone] ? [phone] ? FAX [phone] Web site: [email or web address]  

## 2012-01-31 ENCOUNTER — Telehealth (INDEPENDENT_AMBULATORY_CARE_PROVIDER_SITE_OTHER): Payer: Self-pay

## 2012-01-31 NOTE — Telephone Encounter (Signed)
Pt called today still having problems with constipation.  He has been taking the Miralax bid per Dr. Ardine Eng instructions along with stool softeners and fluids.  I suggested he try Milk of Magnesia, but if nothing is working in another 24 hours to try a Fleets enema.  Pt will call back if needed.

## 2012-02-01 ENCOUNTER — Ambulatory Visit (INDEPENDENT_AMBULATORY_CARE_PROVIDER_SITE_OTHER): Payer: Self-pay | Admitting: Surgery

## 2012-02-05 ENCOUNTER — Other Ambulatory Visit (INDEPENDENT_AMBULATORY_CARE_PROVIDER_SITE_OTHER): Payer: Self-pay | Admitting: Surgery

## 2012-02-05 NOTE — Progress Notes (Signed)
Dr. Gerrit Friends - When you can, could you put orders in [facility] on Mr. Azarel [address] ? Surgery is 02/07/12 Thank you

## 2012-02-06 ENCOUNTER — Encounter (HOSPITAL_COMMUNITY): Payer: Self-pay | Admitting: Pharmacy Technician

## 2012-02-07 ENCOUNTER — Ambulatory Visit (HOSPITAL_COMMUNITY): Payer: BC Managed Care – PPO

## 2012-02-07 ENCOUNTER — Encounter (HOSPITAL_COMMUNITY): Payer: Self-pay | Admitting: *Deleted

## 2012-02-07 ENCOUNTER — Inpatient Hospital Stay (HOSPITAL_COMMUNITY)
Admission: RE | Admit: 2012-02-07 | Discharge: 2012-02-09 | DRG: 494 | Disposition: A | Discharge status: Home / Self Care | Payer: BC Managed Care – PPO | Source: Ambulatory Visit | Attending: Surgery | Admitting: Surgery

## 2012-02-07 ENCOUNTER — Encounter (HOSPITAL_COMMUNITY): Payer: Self-pay | Admitting: Anesthesiology

## 2012-02-07 ENCOUNTER — Encounter (HOSPITAL_COMMUNITY)
Admission: RE | Disposition: A | Discharge status: Home / Self Care | Payer: Self-pay | Source: Ambulatory Visit | Attending: Surgery

## 2012-02-07 ENCOUNTER — Ambulatory Visit (HOSPITAL_COMMUNITY): Payer: BC Managed Care – PPO | Admitting: Anesthesiology

## 2012-02-07 DIAGNOSIS — K8000 Calculus of gallbladder with acute cholecystitis without obstruction: Secondary | ICD-10-CM

## 2012-02-07 DIAGNOSIS — Z8546 Personal history of malignant neoplasm of prostate: Secondary | ICD-10-CM

## 2012-02-07 DIAGNOSIS — K8001 Calculus of gallbladder with acute cholecystitis with obstruction: Principal | ICD-10-CM | POA: Diagnosis present

## 2012-02-07 DIAGNOSIS — E785 Hyperlipidemia, unspecified: Secondary | ICD-10-CM | POA: Diagnosis present

## 2012-02-07 DIAGNOSIS — Z79899 Other long term (current) drug therapy: Secondary | ICD-10-CM

## 2012-02-07 DIAGNOSIS — K8010 Calculus of gallbladder with chronic cholecystitis without obstruction: Secondary | ICD-10-CM | POA: Diagnosis present

## 2012-02-07 DIAGNOSIS — Z8719 Personal history of other diseases of the digestive system: Secondary | ICD-10-CM

## 2012-02-07 HISTORY — PX: CHOLECYSTECTOMY: SHX55

## 2012-02-07 LAB — SURGICAL PCR SCREEN: MRSA, PCR: NEGATIVE

## 2012-02-07 SURGERY — LAPAROSCOPIC CHOLECYSTECTOMY WITH INTRAOPERATIVE CHOLANGIOGRAM
Anesthesia: General | Wound class: Contaminated

## 2012-02-07 MED ORDER — LACTATED RINGERS IV SOLN
INTRAVENOUS | Status: DC | PRN
  Administered 2012-02-07 (×2): via INTRAVENOUS

## 2012-02-07 MED ORDER — HYDROMORPHONE HCL PF 1 MG/ML IJ SOLN
INTRAMUSCULAR | Status: DC | PRN
  Administered 2012-02-07 (×2): 0.5 mg via INTRAVENOUS
  Administered 2012-02-07: 1 mg via INTRAVENOUS

## 2012-02-07 MED ORDER — 0.9 % SODIUM CHLORIDE (POUR BTL) OPTIME
TOPICAL | Status: DC | PRN
  Administered 2012-02-07: 1000 mL

## 2012-02-07 MED ORDER — DEXAMETHASONE SODIUM PHOSPHATE 4 MG/ML IJ SOLN
INTRAMUSCULAR | Status: DC | PRN
  Administered 2012-02-07 (×2): 5 mg via INTRAVENOUS

## 2012-02-07 MED ORDER — ONDANSETRON HCL 4 MG PO TABS: 4.0000 mg | ORAL_TABLET | Freq: Four times a day (QID) | ORAL | Status: DC | PRN

## 2012-02-07 MED ORDER — ONDANSETRON HCL 4 MG/2ML IJ SOLN: 4.0000 mg | Freq: Four times a day (QID) | INTRAMUSCULAR | Status: DC | PRN

## 2012-02-07 MED ORDER — KETOROLAC TROMETHAMINE 30 MG/ML IJ SOLN: 15.0000 mg | Freq: Once | INTRAMUSCULAR | Status: DC | PRN

## 2012-02-07 MED ORDER — LIDOCAINE HCL (CARDIAC) 20 MG/ML IV SOLN
INTRAVENOUS | Status: DC | PRN
  Administered 2012-02-07: 20 mg via INTRAVENOUS

## 2012-02-07 MED ORDER — ONDANSETRON HCL 4 MG/2ML IJ SOLN
INTRAMUSCULAR | Status: DC | PRN
  Administered 2012-02-07 (×2): 2 mg via INTRAVENOUS

## 2012-02-07 MED ORDER — CEFAZOLIN SODIUM-DEXTROSE 2-3 GM-% IV SOLR
INTRAVENOUS | Status: AC
  Filled 2012-02-07: qty 50

## 2012-02-07 MED ORDER — CEFAZOLIN SODIUM-DEXTROSE 2-3 GM-% IV SOLR
2.0000 g | INTRAVENOUS | Status: AC
  Administered 2012-02-07: 2 g via INTRAVENOUS

## 2012-02-07 MED ORDER — HYDROMORPHONE HCL PF 1 MG/ML IJ SOLN: 0.2500 mg | INTRAMUSCULAR | Status: DC | PRN

## 2012-02-07 MED ORDER — FENTANYL CITRATE 0.05 MG/ML IJ SOLN
INTRAMUSCULAR | Status: DC | PRN
  Administered 2012-02-07: 50 ug via INTRAVENOUS
  Administered 2012-02-07: 25 ug via INTRAVENOUS
  Administered 2012-02-07: 50 ug via INTRAVENOUS
  Administered 2012-02-07 (×5): 25 ug via INTRAVENOUS

## 2012-02-07 MED ORDER — HYDROMORPHONE HCL PF 1 MG/ML IJ SOLN
1.0000 mg | INTRAMUSCULAR | Status: DC | PRN
  Administered 2012-02-07 (×4): 1 mg via INTRAVENOUS
  Filled 2012-02-07 (×4): qty 1

## 2012-02-07 MED ORDER — KCL IN DEXTROSE-NACL 30-5-0.45 MEQ/L-%-% IV SOLN
INTRAVENOUS | Status: DC
  Administered 2012-02-07 – 2012-02-08 (×3): via INTRAVENOUS
  Filled 2012-02-07 (×3): qty 1000

## 2012-02-07 MED ORDER — ACETAMINOPHEN 325 MG PO TABS: 650.0000 mg | ORAL_TABLET | ORAL | Status: DC | PRN

## 2012-02-07 MED ORDER — ACETAMINOPHEN 10 MG/ML IV SOLN
INTRAVENOUS | Status: DC | PRN
  Administered 2012-02-07: 1000 mg via INTRAVENOUS

## 2012-02-07 MED ORDER — GLYCOPYRROLATE 0.2 MG/ML IJ SOLN
INTRAMUSCULAR | Status: DC | PRN
  Administered 2012-02-07: 0.4 mg via INTRAVENOUS

## 2012-02-07 MED ORDER — CISATRACURIUM BESYLATE (PF) 10 MG/5ML IV SOLN
INTRAVENOUS | Status: DC | PRN
  Administered 2012-02-07: 10 mg via INTRAVENOUS
  Administered 2012-02-07: 1 mg via INTRAVENOUS
  Administered 2012-02-07: 3 mg via INTRAVENOUS
  Administered 2012-02-07: 4 mg via INTRAVENOUS

## 2012-02-07 MED ORDER — NEOSTIGMINE METHYLSULFATE 1 MG/ML IJ SOLN
INTRAMUSCULAR | Status: DC | PRN
  Administered 2012-02-07: 3 mg via INTRAVENOUS

## 2012-02-07 MED ORDER — LACTATED RINGERS IR SOLN
Status: DC | PRN
  Administered 2012-02-07: 3000 mL

## 2012-02-07 MED ORDER — PROMETHAZINE HCL 25 MG/ML IJ SOLN: 6.2500 mg | INTRAMUSCULAR | Status: DC | PRN

## 2012-02-07 MED ORDER — HYDROCODONE-ACETAMINOPHEN 5-325 MG PO TABS
1.0000 | ORAL_TABLET | ORAL | Status: DC | PRN
  Administered 2012-02-07: 1 via ORAL
  Administered 2012-02-08 – 2012-02-09 (×7): 2 via ORAL
  Filled 2012-02-07: qty 1
  Filled 2012-02-07 (×7): qty 2

## 2012-02-07 MED ORDER — PROPOFOL 10 MG/ML IV EMUL
INTRAVENOUS | Status: DC | PRN
  Administered 2012-02-07: 200 mg via INTRAVENOUS

## 2012-02-07 MED ORDER — SODIUM CHLORIDE 0.9 % IJ SOLN
INTRAMUSCULAR | Status: DC | PRN
  Administered 2012-02-07: 10:00:00

## 2012-02-07 MED ORDER — SUCCINYLCHOLINE CHLORIDE 20 MG/ML IJ SOLN
INTRAMUSCULAR | Status: DC | PRN
  Administered 2012-02-07: 150 mg via INTRAVENOUS

## 2012-02-07 MED ORDER — IOHEXOL 300 MG/ML  SOLN
INTRAMUSCULAR | Status: AC
  Filled 2012-02-07: qty 1

## 2012-02-07 MED ORDER — MIDAZOLAM HCL 5 MG/5ML IJ SOLN
INTRAMUSCULAR | Status: DC | PRN
  Administered 2012-02-07: 0.5 mg via INTRAVENOUS

## 2012-02-07 MED ORDER — ACETAMINOPHEN 10 MG/ML IV SOLN
INTRAVENOUS | Status: AC
  Filled 2012-02-07: qty 100

## 2012-02-07 MED ORDER — BUPIVACAINE-EPINEPHRINE 0.5% -1:200000 IJ SOLN
INTRAMUSCULAR | Status: AC
  Filled 2012-02-07: qty 1

## 2012-02-07 MED ORDER — MUPIROCIN 2 % EX OINT
TOPICAL_OINTMENT | Freq: Two times a day (BID) | CUTANEOUS | Status: DC
  Administered 2012-02-07: 1 via NASAL
  Filled 2012-02-07: qty 22

## 2012-02-07 MED ORDER — BUPIVACAINE-EPINEPHRINE 0.5% -1:200000 IJ SOLN
INTRAMUSCULAR | Status: DC | PRN
  Administered 2012-02-07: 30 mL

## 2012-02-07 MED ORDER — ZOLPIDEM TARTRATE 5 MG PO TABS
5.0000 mg | ORAL_TABLET | Freq: Once | ORAL | Status: AC
  Administered 2012-02-07: 5 mg via ORAL
  Filled 2012-02-07: qty 1

## 2012-02-07 SURGICAL SUPPLY — 44 items
APL SKNCLS STERI-STRIP NONHPOA (GAUZE/BANDAGES/DRESSINGS) ×1
APPLIER CLIP ROT 10 11.4 M/L (STAPLE) ×4
APR CLP MED LRG 11.4X10 (STAPLE) ×2
BAG SPEC RTRVL LRG 6X4 10 (ENDOMECHANICALS) ×1
BENZOIN TINCTURE PRP APPL 2/3 (GAUZE/BANDAGES/DRESSINGS) ×2 IMPLANT
CABLE HIGH FREQUENCY MONO STRZ (ELECTRODE) ×2 IMPLANT
CANISTER SUCTION 2500CC (MISCELLANEOUS) ×2 IMPLANT
CHLORAPREP W/TINT 26ML (MISCELLANEOUS) ×2 IMPLANT
CLIP APPLIE ROT 10 11.4 M/L (STAPLE) ×1 IMPLANT
CLOTH BEACON ORANGE TIMEOUT ST (SAFETY) ×2 IMPLANT
COVER MAYO STAND STRL (DRAPES) ×2 IMPLANT
DECANTER SPIKE VIAL GLASS SM (MISCELLANEOUS) ×2 IMPLANT
DRAIN CHANNEL RND F F (WOUND CARE) ×1 IMPLANT
DRAPE C-ARM 42X72 X-RAY (DRAPES) ×2 IMPLANT
DRAPE LAPAROSCOPIC ABDOMINAL (DRAPES) ×2 IMPLANT
DRAPE UTILITY 15X26 (DRAPE) ×2 IMPLANT
ELECT REM PT RETURN 9FT ADLT (ELECTROSURGICAL) ×2
ELECTRODE REM PT RTRN 9FT ADLT (ELECTROSURGICAL) ×1 IMPLANT
EVACUATOR DRAINAGE 10X20 100CC (DRAIN) IMPLANT
EVACUATOR SILICONE 100CC (DRAIN) ×2
GLOVE BIOGEL PI IND STRL 7.0 (GLOVE) ×1 IMPLANT
GLOVE BIOGEL PI INDICATOR 7.0 (GLOVE) ×1
GLOVE SURG ORTHO 8.0 STRL STRW (GLOVE) ×2 IMPLANT
GOWN STRL NON-REIN LRG LVL3 (GOWN DISPOSABLE) ×2 IMPLANT
GOWN STRL REIN XL XLG (GOWN DISPOSABLE) ×4 IMPLANT
HEMOSTAT SNOW SURGICEL 2X4 (HEMOSTASIS) ×2 IMPLANT
HEMOSTAT SURGICEL 4X8 (HEMOSTASIS) IMPLANT
KIT BASIN OR (CUSTOM PROCEDURE TRAY) ×2 IMPLANT
NS IRRIG 1000ML POUR BTL (IV SOLUTION) ×2 IMPLANT
POUCH SPECIMEN RETRIEVAL 10MM (ENDOMECHANICALS) ×2 IMPLANT
SCISSORS LAP 5X35 DISP (ENDOMECHANICALS) ×1 IMPLANT
SET CHOLANGIOGRAPH MIX (MISCELLANEOUS) ×2 IMPLANT
SET IRRIG TUBING LAPAROSCOPIC (IRRIGATION / IRRIGATOR) ×2 IMPLANT
SLEEVE Z-THREAD 5X100MM (TROCAR) ×2 IMPLANT
SOLUTION ANTI FOG 6CC (MISCELLANEOUS) ×2 IMPLANT
STRIP CLOSURE SKIN 1/2X4 (GAUZE/BANDAGES/DRESSINGS) ×2 IMPLANT
SUT ETHILON 2 0 PS N (SUTURE) ×1 IMPLANT
SUT MNCRL AB 4-0 PS2 18 (SUTURE) ×2 IMPLANT
TOWEL OR 17X26 10 PK STRL BLUE (TOWEL DISPOSABLE) ×5 IMPLANT
TRAY LAP CHOLE (CUSTOM PROCEDURE TRAY) ×2 IMPLANT
TROCAR XCEL BLUNT TIP 100MML (ENDOMECHANICALS) ×2 IMPLANT
TROCAR Z-THREAD FIOS 11X100 BL (TROCAR) ×2 IMPLANT
TROCAR Z-THREAD FIOS 5X100MM (TROCAR) ×4 IMPLANT
TUBING INSUFFLATION 10FT LAP (TUBING) ×2 IMPLANT

## 2012-02-07 NOTE — H&P (View-Only) (Signed)
General Surgery [facility]  Chief Complaint  Patient presents with  . New Evaluation    eval gallbladder with stones - referral from Dr. Robert Bellow    HISTORY: Patient is a 71 year old white male referred by his primary care physician in the emergency department for symptomatic cholelithiasis. The patient had an initial episode of epigastric abdominal pain approximately one month ago. This lasted for approximately one hour. He denies nausea or vomiting. He denies fever. He denies jaundice. He denies acholic stools. Two days ago the patient experienced significant epigastric abdominal pain which was unrelenting.  He presented to the emergency department where he was evaluated. Cardiac issues were out. Laboratory studies showed normal liver function test. CT scan of the abdomen and pelvis demonstrated a large 27 mm gallstone within the gallbladder. No acute inflammatory findings were noted. The remainder of the study was essentially normal.  Patient has no prior history of upper abdominal surgery.  Past Medical History  Diagnosis Date  . Diverticulosis of colon (without mention of hemorrhage)   . Hemorrhoids   . Personal history of colonic polyps 01/23/2003  . Hyperlipemia   . History of prostate cancer   . Cancer      Current Outpatient Prescriptions  Medication Sig Dispense Refill  . AMBULATORY NON FORMULARY MEDICATION Activated [employer] as directed      . aspirin 81 MG tablet Take 81 mg by mouth daily.      Marland Kitchen b complex vitamins tablet Take 1 tablet by mouth daily.      . Calcium Polycarbophil (FIBERCON PO) Take by mouth as directed.      . Coenzyme Q10 (COQ-10) 150 MG CAPS Take 1 capsule by mouth daily.      . Multiple Vitamins-Minerals (CENTRUM SILVER PO) Take 1 tablet by mouth daily.      . ondansetron (ZOFRAN) 8 MG tablet Take 1 tablet (8 mg total) by mouth every 6 (six) hours.  8 tablet  0  . PERCOCET 5-325 MG per tablet Take 1 or 2 po Q 6hrs for pain   15 tablet  0  . rosuvastatin (CRESTOR) 5 MG tablet Take 5 mg by mouth daily.         No Known Allergies   Family History  Problem Relation Age of Onset  . Colon cancer Neg Hx   . Colon polyps Neg Hx   . Rectal cancer Neg Hx   . Stomach cancer Neg Hx   . Heart failure Mother      History   Social History  . Marital Status: Married    Spouse Name: N/A    Number of Children: N/A  . Years of Education: N/A   Occupational History  . Owner [employer]   Social History Main Topics  . Smoking status: Never Smoker   . Smokeless tobacco: Never Used  . Alcohol Use: Yes     wine daily -red  . Drug Use: No  . Sexually Active: None   Other Topics Concern  . None   Social History Narrative  . None     REVIEW OF SYSTEMS - PERTINENT POSITIVES ONLY: Denies jaundice. Denies acholic stools. Denies nausea and vomiting. Denies fever.  EXAM: Filed Vitals:   01/30/12 1630  BP: 124/76  Pulse: 97  Temp: 97.8 F (36.6 C)    HEENT: normocephalic; pupils equal and reactive; sclerae clear; dentition good; mucous membranes moist NECK:  symmetric on extension; no palpable anterior or posterior cervical  lymphadenopathy; no supraclavicular masses; no tenderness CHEST: clear to auscultation bilaterally without rales, rhonchi, or wheezes CARDIAC: regular rate and rhythm without significant murmur; peripheral pulses are full ABDOMEN: soft without distension; bowel sounds present; no mass; no hepatosplenomegaly; no hernia; mild epigastric tenderness EXT:  non-tender without edema; no deformity NEURO: no gross focal deficits; no sign of tremor   LABORATORY RESULTS: See [facility] (CHL-Epic) for most recent results   RADIOLOGY RESULTS: See [facility] (CHL-Epic) for most recent results   IMPRESSION: Symptomatic cholelithiasis  PLAN: The patient and I discussed laparoscopic cholecystectomy at length. I provided him with written ligature to review. We reviewed the  results of his CT scan and his laboratory studies.  I believe the patient is a good candidate for laparoscopic cholecystectomy. We discussed potential complications including the possibility of conversion to open surgery. We discussed the hospital stay to be anticipated and has returned to work and physical activities. He understands and wishes to proceed in the near future.  The risks and benefits of the procedure have been discussed at length with the patient.  The patient understands the proposed procedure, potential alternative treatments, and the course of recovery to be expected.  All of the patient's questions have been answered at this time.  The patient wishes to proceed with surgery.  Velora Heckler, MD, FACS General & Endocrine Surgery [facility]   Visit Diagnoses: 1. Cholelithiasis with cholecystitis     Primary Care Physician: Tally Due, MD

## 2012-02-07 NOTE — Transfer of Care (Signed)
Immediate Anesthesia Transfer of Care Note  Patient: Shawn Bell  Procedure(s) Performed: Procedure(s) (LRB): LAPAROSCOPIC CHOLECYSTECTOMY WITH INTRAOPERATIVE CHOLANGIOGRAM (N/A)  Patient Location: PACU  Anesthesia Type: General  Level of Consciousness: awake, alert , oriented and patient cooperative  Airway & Oxygen Therapy: Patient Spontanous Breathing and Patient connected to face mask oxygen  Post-op Assessment: Report given to PACU RN and Post -op Vital signs reviewed and stable  Post vital signs: Reviewed and stable  Complications: No apparent anesthesia complications

## 2012-02-07 NOTE — Anesthesia Postprocedure Evaluation (Signed)
  Anesthesia Post-op Note  Patient: Shawn Bell  Procedure(s) Performed: Procedure(s) (LRB): LAPAROSCOPIC CHOLECYSTECTOMY WITH INTRAOPERATIVE CHOLANGIOGRAM (N/A)  Patient Location: PACU  Anesthesia Type: General  Level of Consciousness: awake and alert   Airway and Oxygen Therapy: Patient Spontanous Breathing  Post-op Pain: mild  Post-op Assessment: Post-op Vital signs reviewed, Patient's Cardiovascular Status Stable, Respiratory Function Stable, Patent Airway and No signs of Nausea or vomiting  Post-op Vital Signs: stable  Complications: No apparent anesthesia complications

## 2012-02-07 NOTE — Addendum Note (Signed)
Addendum  created 02/07/12 1310 by Kypton Eltringham S Dennice Tindol, CRNA   Modules edited:Anesthesia Flowsheet, Notes Section    

## 2012-02-07 NOTE — Addendum Note (Signed)
Addendum  created 02/07/12 1310 by Valeda Malm, CRNA   Modules edited:Anesthesia Flowsheet, Notes Section

## 2012-02-07 NOTE — Progress Notes (Signed)
General Surgery [facility]  Status post lap chole.  Difficult dissection due to inflammation, posterior wall necrosis.  Drain with serosanguinous, moderate volume.  Abdomen soft.  Patient awake and alert.  Ambulated.  Voided.  Ordered regular dinner.  Continue to monitor drain output.  Check labs in AM 6/22.  Velora Heckler, MD, [facility] Office: [phone]

## 2012-02-07 NOTE — Anesthesia Preprocedure Evaluation (Signed)

## 2012-02-07 NOTE — Brief Op Note (Signed)
02/07/2012  11:24 AM  PATIENT:  Shawn Bell  71 y.o. male  PRE-OPERATIVE DIAGNOSIS:  cholelithiasis,cholecystitis  POST-OPERATIVE DIAGNOSIS:  Subacute cholecystitis, necrosis of gallbladder wall, cholelithiasis  PROCEDURE:  Laparoscopic cholecystectomy, attempted intraoperative cholangiography  SURGEON:  Velora Heckler, MD, FACS  ANESTHESIA:   general  EBL:  Total I/O In: 1500 [I.V.:1500] Out: 30 [Other:10; Blood:20]  BLOOD ADMINISTERED:none  DRAINS: (#19 Fr) Blake drain(s) in the subhepatic space   LOCAL MEDICATIONS USED:  MARCAINE     SPECIMEN:  Excision  DISPOSITION OF SPECIMEN:  PATHOLOGY  COUNTS:  YES  TOURNIQUET:  * No tourniquets in log *  DICTATION: .Other Dictation: Dictation Number [id number]  PLAN OF CARE: Admit for overnight observation  PATIENT DISPOSITION:  PACU - hemodynamically stable.   Delay start of Pharmacological VTE agent (>24hrs) due to surgical blood loss or risk of bleeding: yes  Velora Heckler, MD, [facility] Office: [phone]

## 2012-02-07 NOTE — Interval H&P Note (Signed)
History and Physical Interval Note:  02/07/2012 8:19 AM  Shawn Bell  has presented today for surgery, with the diagnosis of cholelithiasis,cholecystitis.  The various methods of treatment have been discussed with the patient and family. After consideration of risks, benefits and other options for treatment, the patient has consented to    Procedure(s) (LRB): LAPAROSCOPIC CHOLECYSTECTOMY WITH INTRAOPERATIVE CHOLANGIOGRAM (N/A) as a surgical intervention .    The patient's history has been reviewed, patient examined, no change in status, stable for surgery.  I have reviewed the patient's chart and labs.  Questions were answered to the patient's satisfaction.    Velora Heckler, MD, [facility] Office: [phone]   Corissa Oguinn Judie Petit

## 2012-02-07 NOTE — Transfer of Care (Signed)
Immediate Anesthesia Transfer of Care Note  Patient: Shawn Bell  Procedure(s) Performed: Procedure(s) (LRB): LAPAROSCOPIC CHOLECYSTECTOMY WITH INTRAOPERATIVE CHOLANGIOGRAM (N/A)  Patient Location:   Anesthesia Type:   Level of Consciousness:   Airway & Oxygen Therapy:   Post-op Assessme  Post vital signs:   Complications: duplicate note

## 2012-02-08 ENCOUNTER — Encounter (HOSPITAL_COMMUNITY): Payer: Self-pay | Admitting: Surgery

## 2012-02-08 LAB — PROTIME-INR: Prothrombin Time: 13.8 seconds (ref 11.6–15.2)

## 2012-02-08 LAB — COMPREHENSIVE METABOLIC PANEL
ALT: 78 U/L — ABNORMAL HIGH (ref 0–53)
AST: 54 U/L — ABNORMAL HIGH (ref 0–37)
Alkaline Phosphatase: 113 U/L (ref 39–117)
CO2: 25 mEq/L (ref 19–32)
Calcium: 8.7 mg/dL (ref 8.4–10.5)
Chloride: 101 mEq/L (ref 96–112)
GFR calc non Af Amer: 81 mL/min — ABNORMAL LOW (ref >90)
Potassium: 4.5 mEq/L (ref 3.5–5.1)
Sodium: 135 mEq/L (ref 135–145)
Total Bilirubin: 0.3 mg/dL (ref 0.3–1.2)

## 2012-02-08 LAB — CBC
Hemoglobin: 12.7 g/dL — ABNORMAL LOW (ref 13.0–17.0)
MCH: 31.9 pg (ref 26.0–34.0)
MCHC: 34.5 g/dL (ref 30.0–36.0)
RDW: 12 % (ref 11.5–15.5)

## 2012-02-08 MED ORDER — ZOLPIDEM TARTRATE 5 MG PO TABS
5.0000 mg | ORAL_TABLET | Freq: Once | ORAL | Status: AC
  Administered 2012-02-08: 5 mg via ORAL
  Filled 2012-02-08: qty 1

## 2012-02-08 NOTE — Progress Notes (Signed)
Patient ID: Shawn Bell, male   DOB: [birth year], 71 y.o.   MRN: [id number]  General Surgery - [facility] - Progress Note  POD# 1  Subjective: Patient comfortable.  Tolerated about half of dinner.  No nausea or emesis.  Mild pain.  Ambulating.  Objective: Vital signs in last 24 hours: Temp:  [97.5 F (36.4 C)-98.1 F (36.7 C)] 97.5 F (36.4 C) (08/22 0617) Pulse Rate:  [63-95] 72  (08/22 0617) Resp:  [10-19] 18  (08/22 0617) BP: (115-151)/(66-87) 115/66 mmHg (08/22 0617) SpO2:  [96 %-98 %] 98 % (08/22 0617) Last BM Date: 02/07/12  Intake/Output from previous day: 08/21 0701 - 08/22 0700 In: 3166.3 [P.O.:220; I.V.:2946.3] Out: 2060 [Urine:1825; Drains:205; Blood:20]  Exam: HEENT - clear, not icteric Neck - soft Chest - clear bilaterally Cor - RRR, no murmur Abd - mild distension; drain with serosanguinous - no bile Ext - no significant edema Neuro - grossly intact, no focal deficits  Lab Results:   Basename 02/08/12 0431  WBC 15.1*  HGB 12.7*  HCT 36.8*  PLT 361     Basename 02/08/12 0431  NA 135  K 4.5  CL 101  CO2 25  GLUCOSE 119*  BUN 13  CREATININE 0.93  CALCIUM 8.7    Studies/Results: Dg C-arm 61-120 Min-no Report  02/07/2012  CLINICAL DATA: gallbladder   C-ARM 61-120 MINUTES  Fluoroscopy was utilized by the requesting physician.  No radiographic  interpretation.      Assessment / Plan: 1.  Status post lap chole for subacute cholecystitis, cholelithiasis  - leave drain one more day and observe for signs of bile leak  - check labs in AM 6/23 - WBC mildly elevated, SGOT & SGPT mildly elevated today  - likely home tomorrow  Velora Heckler, MD, [facility] Office: [phone]  02/08/2012

## 2012-02-08 NOTE — Op Note (Signed)
NAMEMAHAMUD, METTS NO.:  [id number]  MEDICAL RECORD NO.:  [id number]  LOCATION:  1523                         FACILITY:  [facility]  PHYSICIAN:  Velora Heckler, MD      DATE OF BIRTH:  11/16/1940  DATE OF PROCEDURE: 07 February 2012                               OPERATIVE REPORT   PREOPERATIVE DIAGNOSIS:  Chronic cholecystitis, cholelithiasis.  POSTOPERATIVE DIAGNOSIS:  Subacute cholecystitis, necrosis of the gallbladder wall, cholelithiasis.  PROCEDURE:  Laparoscopic cholecystectomy, attempted intraoperative cholangiography.  SURGEON:  Darnell Level, MD, FACS  ANESTHESIA:  General per Dr. Eilene Ghazi.  ESTIMATED BLOOD LOSS:  150 mL.  PREPARATION:  ChloraPrep.  COMPLICATIONS:  None.  INDICATIONS:  The patient is a 71 year old white male referred by Dr. Robert Bellow for signs and symptoms of cholecystitis and cholelithiasis. The patient had 2 discrete episodes of epigastric abdominal pain over the past 6 weeks.  The 2nd episode was severe and he was seen in the emergency department.  Cardiac etiology was ruled out.  Ultrasound demonstrated cholelithiasis.  The patient was referred to general surgery for cholecystectomy.  BODY OF REPORT:  Procedure was done in OR #6 at the [facility].  The patient was brought to the operating room, placed in supine position on the operating room table.  Following administration of general anesthesia, the patient was positioned and then prepped and draped in usual strict aseptic fashion.  After ascertaining that an adequate level of anesthesia been achieved, an infraumbilical incision was made with a #15 blade.  Dissection was carried through subcutaneous tissues.  Fascia was incised in the midline and the peritoneal cavity was entered cautiously.  A 0 Vicryl pursestring suture was placed in the fascia.  An assigned cannula was introduced under direct vision and secured with a pursestring suture. Abdomen  was insufflated with carbon dioxide.  Laparoscope was introduced.  Operative ports were placed along the right costal margin in the midline, midclavicular line, and anterior axillary line.  In the right upper quadrant, there is an obvious intrahepatic gallbladder with dense adhesions of the omentum to the dome and the undersurface of the gallbladder.  With some difficulty, the omentum was mobilized off the surface of the gallbladder exposing the gallbladder wall.  Gallbladder was markedly thickened and appears inflamed.  It was tense and distended.  Using an aspirating trocar the gallbladder was evacuated.  It was then grasped and retracted cephalad.  A 30-degree scope was employed to facilitate visualization of the gallbladder neck.  Duodenum was somewhat adherent to the undersurface of the gallbladder. It was mobilized with gentle blunt dissection.  Neck of the gallbladder was then gently dissected out with blunt dissection.  The cystic duct was identified and dissected out immediately proximate to the wall of the gallbladder.  Clip was placed at the neck of the gallbladder and the cystic duct was incised, clear yellow bile emanates from the cystic duct.  Multiple attempts were made to perform intraoperative cholangiography.  A Cook cholangiography catheter was introduced through a stab wound in the right upper quadrant.  It was inserted into the cystic duct.  However,  it failed to advance.  It was repositioned and secured with a Ligaclip.  C-arm fluoroscopy was employed.  Injection of contrast, however, does not demonstrate any ductile structures and the contrast appears to pool in the right upper quadrant.  Therefore, the clip was withdrawn and the catheter was removed from the cystic duct and repositioned.  It was again injected and again appears that the contrast pools in the right upper quadrant and does not fill ductile structures. Additional attempts are unsuccessful.   Therefore, decision was made to discontinue attempts at cholangiography.  The cystic duct was then doubly clipped, and divided.  Further dissection reveals 3 separate small branches of the cystic artery going directly to the surface of the gallbladder.  These were divided between Ligaclips with the scissors. Gallbladder was then excised in the gallbladder bed using the hook electrocautery.  Dissection was difficult due to the inflammation.  The posterior wall of the gallbladder is grossly necrotic, Nagy in color, and partially discontinuous.  The remainder of the gallbladder was excised using the electrocautery for hemostasis.  The entire gallbladder was then placed into an EndoCatch bag and withdrawn through the umbilical port and submitted to pathology.  Right upper quadrant was copiously irrigated with warm saline. Hemostasis was obtained in the gallbladder bed with a hook electrocautery.  Surgicel was placed in the gallbladder bed.  A 19- French drain was placed in the right upper quadrant and brought out through the lateral port site in the right upper quadrant.  It was secured to the skin with a 3-0 nylon suture.  Drain was placed to bulb suction.  Right upper quadrant was irrigated with warm saline and evacuated.  Good hemostasis was noted.  Pneumoperitoneum was released.  Ports were removed under direct vision. Good hemostasis was noted at all port sites.  A 0 Vicryl pursestring suture was tied securely at the umbilicus.  Port sites were anesthetized with local anesthetic.  Wounds were closed with interrupted 4-0 Monocryl subcuticular sutures.  Wounds were washed and dried and benzoin and Steri-Strips were applied.  Sterile dressings were applied.  The patient was awakened from anesthesia and brought to the recovery room.  The patient tolerated the procedure well.  Velora Heckler, MD, [facility] Office: [phone]   TMG/MEDQ  D:  02/07/2012  T:   02/08/2012  Job:  [id number]  cc:   Jonita Albee, M.D. Fax: [phone]

## 2012-02-09 LAB — COMPREHENSIVE METABOLIC PANEL
ALT: 57 U/L — ABNORMAL HIGH (ref 0–53)
Alkaline Phosphatase: 99 U/L (ref 39–117)
BUN: 11 mg/dL (ref 6–23)
CO2: 28 mEq/L (ref 19–32)
GFR calc Af Amer: >90 mL/min (ref >90)
GFR calc non Af Amer: 80 mL/min — ABNORMAL LOW (ref >90)
Glucose, Bld: 90 mg/dL (ref 70–99)
Potassium: 4.3 mEq/L (ref 3.5–5.1)
Total Bilirubin: 0.3 mg/dL (ref 0.3–1.2)
Total Protein: 6.2 g/dL (ref 6.0–8.3)

## 2012-02-09 LAB — CBC
HCT: 37 % — ABNORMAL LOW (ref 39.0–52.0)
Hemoglobin: 12.5 g/dL — ABNORMAL LOW (ref 13.0–17.0)
MCHC: 33.8 g/dL (ref 30.0–36.0)
RBC: 3.95 MIL/uL — ABNORMAL LOW (ref 4.22–5.81)

## 2012-02-09 MED ORDER — HYDROCODONE-ACETAMINOPHEN 5-325 MG PO TABS: 1.0000 | ORAL_TABLET | ORAL | Status: AC | PRN

## 2012-02-09 NOTE — Care Management Note (Signed)
    Page 1 of 1   02/09/2012     12:13:51 PM   CARE MANAGEMENT NOTE 02/09/2012  Patient:  Shawn Bell, Shawn Bell   Account Number:  [id number]  Date Initiated:  02/09/2012  Documentation initiated by:  Lorenda Ishihara  Subjective/Objective Assessment:   71 yo male admitted s/p lap chole. PTA lived at home with spouse.     Action/Plan:   Anticipated DC Date:  02/09/2012   Anticipated DC Plan:  HOME/SELF CARE      DC Planning Services  CM consult      Choice offered to / List presented to:             Status of service:  Completed, signed off Medicare Important Message given?   (If response is "NO", the following Medicare IM given date fields will be blank) Date Medicare IM given:   Date Additional Medicare IM given:    Discharge Disposition:  HOME/SELF CARE  Per UR Regulation:  Reviewed for med. necessity/level of care/duration of stay  If discussed at Long Length of Stay Meetings, dates discussed:    Comments:

## 2012-02-09 NOTE — Discharge Summary (Signed)
Physician Discharge Summary [facility]  Patient ID: Shawn Bell MRN: [id number] DOB/AGE: 73-Jul-1942 73 y.o.  Admit date: 02/07/2012 Discharge date: 02/09/2012  Admission Diagnoses:  Cholecystitis, cholelithiasis   Discharge Diagnoses:  Principal Problem:  *Cholelithiasis with cholecystitis   Discharged Condition: good  Hospital Course: patient admitted after lap chole for acute cholecystitis with necrosis  Consults: None  Significant Diagnostic Studies: labs: CBC, C-MET  Treatments: surgery: lap cholecystectomy  Discharge Exam: Blood pressure 120/80, pulse 65, temperature 97.6 F (36.4 C), temperature source Oral, resp. rate 18, height 6\' 1"  (1.854 m), weight 208 lb (94.348 kg), SpO2 98.00%. HEENT - clear, no jaundice Neck - soft Chest - clear Cor - RRR Abd - drain with serous - removed; soft; minimal tenderness; flatus present Ext - no edema  Disposition: Home with family  Discharge Orders    Future Orders Please Complete By Expires   Diet - low sodium heart healthy      Increase activity slowly      Discharge instructions      Comments:   [facility] LAPAROSCOPIC SURGERY: POST OP INSTRUCTIONS  Always review your discharge instruction sheet given to you by the facility where your surgery was performed.  A prescription for pain medication may be given to you upon discharge.  Take your pain medication as prescribed.  If narcotic pain medicine is not needed, then you may take acetaminophen (Tylenol) or ibuprofen (Advil) as needed. Take your usually prescribed medications unless otherwise directed. If you need a refill on your pain medication, please contact your pharmacy.  They will contact our office to request authorization. Prescriptions will not be filled after 5 P.M. or on weekends. You should follow a light diet the first few days after arrival home, such as soup and crackers or toast.  Be sure to include plenty of  fluids daily. Most patients will experience some swelling and bruising in the area of the incisions.  Ice packs will help.  Swelling and bruising can take several days to resolve.  It is common to experience some constipation if taking pain medication after surgery.  Increasing fluid intake and taking a stool softener (such as Colace) will usually help or prevent this problem from occurring.  A mild laxative (Milk of Magnesia or Miralax) should be taken according to package instructions if there are no bowel movements after 48 hours. Unless discharge instructions indicate otherwise, you may remove your bandages 24-48 hours after surgery, and you may shower at that time.  You may have steri-strips (small skin tapes) in place directly over the incision.  These strips should be left on the skin for 7-10 days.  If your surgeon used skin glue on the incision, you may shower in 24 hours.  The glue will flake off over the next 2-3 weeks.  Any sutures or staples will be removed at the office during your follow-up visit. ACTIVITIES:  You may resume regular (light) daily activities beginning the next day-such as daily self-care, walking, climbing stairs-gradually increasing activities as tolerated.  You may have sexual intercourse when it is comfortable.  Refrain from any heavy lifting or straining until approved by your doctor. You may drive when you are no longer taking prescription pain medication, you can comfortably wear a seatbelt, and you can safely maneuver your car and apply brakes. You should see your doctor in the office for a follow-up appointment approximately 2-3 weeks after your surgery.  Make sure that you call for  this appointment within a day or two after you arrive home to insure a convenient appointment time.  WHEN TO CALL YOUR DOCTOR: Fever over 101.0 Inability to urinate Continued bleeding from incision Increased pain, redness, or drainage from the incision Increasing abdominal pain  The  clinic staff is available to answer your questions during regular business hours.  Please don't hesitate to call and ask to speak to one of the nurses for clinical concerns.  If you have a medical emergency, go to the nearest emergency room or call 911.  A surgeon from [facility] is always on call for the hospital. [phone] ? [phone] ? FAX [phone] Web site: [email or web address]   Remove dressing in 24 hours        Medication List  As of 02/09/2012 10:13 AM   TAKE these medications         AMBULATORY NON FORMULARY MEDICATION   Activated Queretin  Takes as directed      aspirin 81 MG tablet   Take 81 mg by mouth daily.      b complex vitamins tablet   Take 1 tablet by mouth daily.      CENTRUM SILVER PO   Take 1 tablet by mouth daily.      CoQ-10 150 MG Caps   Take 1 capsule by mouth daily.      FIBERCON PO   Take by mouth as directed.      HYDROcodone-acetaminophen 5-325 MG per tablet   Commonly known as: NORCO/VICODIN   Take 1-2 tablets by mouth every 4 (four) hours as needed for pain.      rosuvastatin 5 MG tablet   Commonly known as: CRESTOR   Take 5 mg by mouth at bedtime.           Follow-up Information    Follow up with Velora Heckler, MD. Call in 2 days. (for wound check in about 2 weeks)    Contact information:   [address] [place] [address] [phone]         Velora Heckler, MD, [facility] Office: [phone]   Signed: Velora Heckler 02/09/2012, 10:13 AM

## 2012-02-12 ENCOUNTER — Other Ambulatory Visit: Payer: Self-pay | Admitting: Orthopedic Surgery

## 2012-02-12 ENCOUNTER — Telehealth (INDEPENDENT_AMBULATORY_CARE_PROVIDER_SITE_OTHER): Payer: Self-pay

## 2012-02-12 MED ORDER — DEXAMETHASONE SODIUM PHOSPHATE 10 MG/ML IJ SOLN: 10.0000 mg | Freq: Once | INTRAMUSCULAR | Status: DC

## 2012-02-12 MED ORDER — BUPIVACAINE LIPOSOME 1.3 % IJ SUSP: 20.0000 mL | Freq: Once | INTRAMUSCULAR | Status: DC

## 2012-02-12 NOTE — Telephone Encounter (Signed)
PO appt given for 9-16.

## 2012-02-12 NOTE — Progress Notes (Signed)
Preoperative surgical orders have been place into the Epic hospital system for Shawn Bell on 02/12/2012, 8:28 AM  by Patrica Duel for surgery on 03/06/2012.  Preop Total Hip orders including Experel Injecion, IV Tylenol, and IV Decadron as long as there are no contraindications to the above medications. Avel Peace, PA-C

## 2012-02-23 ENCOUNTER — Encounter (HOSPITAL_COMMUNITY): Payer: Self-pay | Admitting: Pharmacy Technician

## 2012-02-29 ENCOUNTER — Inpatient Hospital Stay (HOSPITAL_COMMUNITY): Admission: RE | Admit: 2012-02-29 | Payer: BC Managed Care – PPO | Source: Ambulatory Visit

## 2012-03-01 ENCOUNTER — Encounter (HOSPITAL_COMMUNITY)
Admission: RE | Admit: 2012-03-01 | Discharge: 2012-03-01 | Disposition: A | Discharge status: Home / Self Care | Payer: BC Managed Care – PPO | Source: Ambulatory Visit | Attending: Orthopedic Surgery | Admitting: Orthopedic Surgery

## 2012-03-01 ENCOUNTER — Encounter (HOSPITAL_COMMUNITY): Payer: Self-pay

## 2012-03-01 ENCOUNTER — Ambulatory Visit (HOSPITAL_COMMUNITY)
Admission: RE | Admit: 2012-03-01 | Discharge: 2012-03-01 | Disposition: A | Discharge status: Home / Self Care | Payer: BC Managed Care – PPO | Source: Ambulatory Visit | Attending: Orthopedic Surgery | Admitting: Orthopedic Surgery

## 2012-03-01 DIAGNOSIS — M169 Osteoarthritis of hip, unspecified: Secondary | ICD-10-CM | POA: Insufficient documentation

## 2012-03-01 DIAGNOSIS — M1610 Unilateral primary osteoarthritis, unspecified hip: Secondary | ICD-10-CM | POA: Insufficient documentation

## 2012-03-01 DIAGNOSIS — Z01812 Encounter for preprocedural laboratory examination: Secondary | ICD-10-CM | POA: Insufficient documentation

## 2012-03-01 LAB — ABO/RH: ABO/RH(D): A NEG

## 2012-03-01 LAB — SURGICAL PCR SCREEN: MRSA, PCR: NEGATIVE

## 2012-03-01 NOTE — Patient Instructions (Signed)
YOUR SURGERY IS SCHEDULED ON:  WED  9/18  AT  10:45 AM  REPORT TO [facility] AT:  8:15 AM      PHONE # FOR SHORT STAY IS [phone]  DO NOT EAT OR DRINK ANYTHING AFTER MIDNIGHT THE NIGHT BEFORE YOUR SURGERY.  YOU MAY BRUSH YOUR TEETH, RINSE OUT YOUR MOUTH--BUT NO WATER, NO FOOD, NO CHEWING GUM, NO MINTS, NO CANDIES, NO CHEWING TOBACCO.  PLEASE TAKE THE FOLLOWING MEDICATIONS THE AM OF YOUR SURGERY WITH A FEW SIPS OF WATER:  NO MEDS TO TAKE    DO NOT BRING VALUABLES, MONEY, CREDIT CARDS.  CONTACT LENS, DENTURES / PARTIALS, GLASSES SHOULD NOT BE WORN TO SURGERY AND IN MOST CASES-HEARING AIDS WILL NEED TO BE REMOVED.  BRING YOUR GLASSES CASE, ANY EQUIPMENT NEEDED FOR YOUR CONTACT LENS. FOR PATIENTS ADMITTED TO THE HOSPITAL--CHECK OUT TIME THE DAY OF DISCHARGE IS 11:00 AM.  ALL INPATIENT ROOMS ARE PRIVATE - WITH BATHROOM, TELEPHONE, TELEVISION AND WIFI INTERNET. IF YOU ARE BEING DISCHARGED THE SAME DAY OF YOUR SURGERY--YOU CAN NOT DRIVE YOURSELF HOME--AND SHOULD NOT GO HOME ALONE BY TAXI OR BUS.  NO DRIVING OR OPERATING MACHINERY FOR 24 HOURS FOLLOWING ANESTHESIA / PAIN MEDICATIONS.                            SPECIAL INSTRUCTIONS:  CHLORHEXIDINE SOAP SHOWER (other brand names are Betasept and Hibiclens ) PLEASE SHOWER WITH CHLORHEXIDINE THE NIGHT BEFORE YOUR SURGERY AND THE AM OF YOUR SURGERY. DO NOT USE CHLORHEXIDINE ON YOUR FACE OR PRIVATE AREAS--YOU MAY USE YOUR NORMAL SOAP THOSE AREAS AND YOUR NORMAL SHAMPOO.  WOMEN SHOULD AVOID SHAVING UNDER ARMS AND SHAVING LEGS 48 HOURS BEFORE USING CHLORHEXIDINE TO AVOID SKIN IRRITATION.  DO NOT USE IF ALLERGIC TO CHLORHEXIDINE.  PLEASE READ OVER ANY  FACT SHEETS THAT YOU WERE GIVEN: MRSA INFORMATION, BLOOD TRANSFUSION INFORMATION, INCENTIVE SPIROMETER INFORMATION.

## 2012-03-01 NOTE — Pre-Procedure Instructions (Addendum)
PT BROUGHT COPIES OF HIS LABS THAT WERE COLLECTED 02/27/12 BY [facility]:  CBC, CMET, PT, PTT, UA.  LABS WITH IN NORMALS AND PLACED ON PT'S CHART.  PT HAS EKG AND CXR REPORTS IN EPIC FROM 01/28/12. T/S AND HIP XRAY WERE DONE TODAY AT [facility] PREOP. PREOP INSTRUCTIONS DISCUSSED WITH PT USING TEACH BACK METHOD. PT BROUGHT ENVELOPE FROM DR. ALUISIO'S OFFICE WITH HIS H&P -AND MEDICAL CLEARANCE FROM DR. GUEST--FORMS PLACED ON PT'S CHART.

## 2012-03-04 ENCOUNTER — Encounter (INDEPENDENT_AMBULATORY_CARE_PROVIDER_SITE_OTHER): Payer: Self-pay | Admitting: Surgery

## 2012-03-04 ENCOUNTER — Ambulatory Visit (INDEPENDENT_AMBULATORY_CARE_PROVIDER_SITE_OTHER): Payer: BC Managed Care – PPO | Admitting: Surgery

## 2012-03-04 VITALS — BP 132/76 | HR 59 | Temp 97.0°F | Resp 18 | Ht 73.0 in | Wt 212.0 lb

## 2012-03-04 DIAGNOSIS — K8010 Calculus of gallbladder with chronic cholecystitis without obstruction: Secondary | ICD-10-CM

## 2012-03-04 NOTE — Progress Notes (Signed)
General Surgery [facility]  Visit Diagnoses: 1. Cholelithiasis with cholecystitis     HISTORY: Patient returns for his first postoperative visit having undergone a laparoscopic cholecystectomy on 02/07/2012. Final pathology showed a necrotic gallbladder mucosa with acute and chronic inflammation and abscess formation consistent with acute full thickness suppurative cholecystitis. Gallstones were present. No evidence of malignancy.  Postoperative course has been uneventful. Patient is tolerating a regular diet. Patient is having normal bowel movements. Patient had followup laboratory studies performed 02/27/2012. This shows normal liver function test, white blood cell count 6.1, hemoglobin 14.2.  EXAM: Abdomen is soft nontender without distention. Surgical wounds are well healed. No sign of infection. No sign of herniation. No tenderness.  IMPRESSION: Status post laparoscopic cholecystectomy for acute cholecystitis and cholelithiasis  PLAN: Patient will begin applying topical creams to his incisions. He is released to full activity without restriction.  Patient is scheduled to proceed with left hip replacement surgery later this week. He is cleared for surgery from a general surgery standpoint.  Velora Heckler, MD, FACS General & Endocrine Surgery [facility]

## 2012-03-04 NOTE — Patient Instructions (Signed)
  COCOA BUTTER & VITAMIN E CREAM  (Palmer's or other brand)  Apply cocoa butter/vitamin E cream to your incision 2 - 3 times daily.  Massage cream into incision for one minute with each application.  Use sunscreen (50 SPF or higher) for first 6 months after surgery if area is exposed to sun.  You may substitute Mederma or other scar reducing creams as desired.   

## 2012-03-05 ENCOUNTER — Encounter (INDEPENDENT_AMBULATORY_CARE_PROVIDER_SITE_OTHER): Payer: Self-pay

## 2012-03-05 MED ORDER — CEFAZOLIN SODIUM 10 G IJ SOLR
3.0000 g | INTRAMUSCULAR | Status: AC
  Administered 2012-03-06: 2 g via INTRAVENOUS

## 2012-03-06 ENCOUNTER — Encounter (HOSPITAL_COMMUNITY): Payer: Self-pay | Admitting: Anesthesiology

## 2012-03-06 ENCOUNTER — Inpatient Hospital Stay (HOSPITAL_COMMUNITY): Payer: BC Managed Care – PPO | Admitting: Anesthesiology

## 2012-03-06 ENCOUNTER — Inpatient Hospital Stay (HOSPITAL_COMMUNITY): Payer: BC Managed Care – PPO

## 2012-03-06 ENCOUNTER — Encounter (HOSPITAL_COMMUNITY): Payer: Self-pay | Admitting: *Deleted

## 2012-03-06 ENCOUNTER — Encounter (HOSPITAL_COMMUNITY)
Admission: RE | Disposition: A | Discharge status: Home Health | Payer: Self-pay | Source: Ambulatory Visit | Attending: Orthopedic Surgery

## 2012-03-06 ENCOUNTER — Inpatient Hospital Stay (HOSPITAL_COMMUNITY)
Admission: RE | Admit: 2012-03-06 | Discharge: 2012-03-08 | DRG: 818 | Disposition: A | Discharge status: Home Health | Payer: BC Managed Care – PPO | Source: Ambulatory Visit | Attending: Orthopedic Surgery | Admitting: Orthopedic Surgery

## 2012-03-06 DIAGNOSIS — Z8546 Personal history of malignant neoplasm of prostate: Secondary | ICD-10-CM

## 2012-03-06 DIAGNOSIS — Z96649 Presence of unspecified artificial hip joint: Secondary | ICD-10-CM

## 2012-03-06 DIAGNOSIS — M1610 Unilateral primary osteoarthritis, unspecified hip: Principal | ICD-10-CM | POA: Diagnosis present

## 2012-03-06 DIAGNOSIS — E871 Hypo-osmolality and hyponatremia: Secondary | ICD-10-CM | POA: Diagnosis not present

## 2012-03-06 DIAGNOSIS — M169 Osteoarthritis of hip, unspecified: Principal | ICD-10-CM | POA: Diagnosis present

## 2012-03-06 HISTORY — PX: TOTAL HIP ARTHROPLASTY: SHX124

## 2012-03-06 LAB — TYPE AND SCREEN: Antibody Screen: NEGATIVE

## 2012-03-06 SURGERY — ARTHROPLASTY, HIP, TOTAL,POSTERIOR APPROACH
Anesthesia: General | Site: Hip | Laterality: Left | Wound class: Clean

## 2012-03-06 MED ORDER — ZOLPIDEM TARTRATE 5 MG PO TABS
5.0000 mg | ORAL_TABLET | Freq: Every evening | ORAL | Status: DC | PRN
  Administered 2012-03-06: 5 mg via ORAL
  Filled 2012-03-06: qty 1

## 2012-03-06 MED ORDER — LACTATED RINGERS IV SOLN
INTRAVENOUS | Status: DC
  Administered 2012-03-06: 12:00:00 via INTRAVENOUS
  Administered 2012-03-06: 1000 mL via INTRAVENOUS

## 2012-03-06 MED ORDER — METOCLOPRAMIDE HCL 10 MG PO TABS: 5.0000 mg | ORAL_TABLET | Freq: Three times a day (TID) | ORAL | Status: DC | PRN

## 2012-03-06 MED ORDER — MIDAZOLAM HCL 5 MG/5ML IJ SOLN
INTRAMUSCULAR | Status: DC | PRN
  Administered 2012-03-06: 2 mg via INTRAVENOUS

## 2012-03-06 MED ORDER — ONDANSETRON HCL 4 MG PO TABS: 4.0000 mg | ORAL_TABLET | Freq: Four times a day (QID) | ORAL | Status: DC | PRN

## 2012-03-06 MED ORDER — DEXAMETHASONE SODIUM PHOSPHATE 10 MG/ML IJ SOLN
INTRAMUSCULAR | Status: DC | PRN
  Administered 2012-03-06: 3 mg via INTRAVENOUS
  Administered 2012-03-06: 10 mg via INTRAVENOUS

## 2012-03-06 MED ORDER — ACETAMINOPHEN 325 MG PO TABS: 650.0000 mg | ORAL_TABLET | Freq: Four times a day (QID) | ORAL | Status: DC | PRN

## 2012-03-06 MED ORDER — FLEET ENEMA 7-19 GM/118ML RE ENEM: 1.0000 | ENEMA | Freq: Once | RECTAL | Status: AC | PRN

## 2012-03-06 MED ORDER — MORPHINE SULFATE 2 MG/ML IJ SOLN: 1.0000 mg | INTRAMUSCULAR | Status: DC | PRN

## 2012-03-06 MED ORDER — TRAMADOL HCL 50 MG PO TABS: 50.0000 mg | ORAL_TABLET | Freq: Four times a day (QID) | ORAL | Status: DC | PRN

## 2012-03-06 MED ORDER — MENTHOL 3 MG MT LOZG
1.0000 | LOZENGE | OROMUCOSAL | Status: DC | PRN
  Filled 2012-03-06: qty 9

## 2012-03-06 MED ORDER — DEXTROSE-NACL 5-0.9 % IV SOLN
INTRAVENOUS | Status: DC
  Administered 2012-03-06 – 2012-03-07 (×2): via INTRAVENOUS

## 2012-03-06 MED ORDER — METHOCARBAMOL 500 MG PO TABS
500.0000 mg | ORAL_TABLET | Freq: Four times a day (QID) | ORAL | Status: DC | PRN
  Administered 2012-03-06 – 2012-03-08 (×4): 500 mg via ORAL
  Filled 2012-03-06 (×5): qty 1

## 2012-03-06 MED ORDER — PHENOL 1.4 % MT LIQD
1.0000 | OROMUCOSAL | Status: DC | PRN
  Filled 2012-03-06: qty 177

## 2012-03-06 MED ORDER — MEPERIDINE HCL 50 MG/ML IJ SOLN: 6.2500 mg | INTRAMUSCULAR | Status: DC | PRN

## 2012-03-06 MED ORDER — LIDOCAINE HCL 4 % MT SOLN
OROMUCOSAL | Status: DC | PRN
  Administered 2012-03-06: 4 mL via TOPICAL

## 2012-03-06 MED ORDER — CHLORHEXIDINE GLUCONATE 4 % EX LIQD
60.0000 mL | Freq: Once | CUTANEOUS | Status: DC
  Filled 2012-03-06: qty 60

## 2012-03-06 MED ORDER — ACETAMINOPHEN 10 MG/ML IV SOLN
1000.0000 mg | Freq: Four times a day (QID) | INTRAVENOUS | Status: AC
  Administered 2012-03-06 – 2012-03-07 (×4): 1000 mg via INTRAVENOUS
  Filled 2012-03-06 (×5): qty 100

## 2012-03-06 MED ORDER — ACETAMINOPHEN 10 MG/ML IV SOLN: 1000.0000 mg | Freq: Once | INTRAVENOUS | Status: DC

## 2012-03-06 MED ORDER — OXYCODONE HCL 5 MG/5ML PO SOLN
5.0000 mg | Freq: Once | ORAL | Status: DC | PRN
  Filled 2012-03-06: qty 5

## 2012-03-06 MED ORDER — ACETAMINOPHEN 10 MG/ML IV SOLN: 1000.0000 mg | Freq: Once | INTRAVENOUS | Status: DC | PRN

## 2012-03-06 MED ORDER — METOCLOPRAMIDE HCL 5 MG/ML IJ SOLN: 5.0000 mg | Freq: Three times a day (TID) | INTRAMUSCULAR | Status: DC | PRN

## 2012-03-06 MED ORDER — HYDROMORPHONE HCL PF 1 MG/ML IJ SOLN
INTRAMUSCULAR | Status: AC
  Filled 2012-03-06: qty 2

## 2012-03-06 MED ORDER — ONDANSETRON HCL 4 MG/2ML IJ SOLN
INTRAMUSCULAR | Status: DC | PRN
  Administered 2012-03-06: 4 mg via INTRAVENOUS

## 2012-03-06 MED ORDER — POLYETHYLENE GLYCOL 3350 17 G PO PACK: 17.0000 g | PACK | Freq: Every day | ORAL | Status: DC | PRN

## 2012-03-06 MED ORDER — BISACODYL 10 MG RE SUPP: 10.0000 mg | Freq: Every day | RECTAL | Status: DC | PRN

## 2012-03-06 MED ORDER — RIVAROXABAN 10 MG PO TABS
10.0000 mg | ORAL_TABLET | Freq: Every day | ORAL | Status: DC
  Administered 2012-03-07 – 2012-03-08 (×2): 10 mg via ORAL
  Filled 2012-03-06 (×3): qty 1

## 2012-03-06 MED ORDER — OXYCODONE HCL 5 MG PO TABS: 5.0000 mg | ORAL_TABLET | Freq: Once | ORAL | Status: DC | PRN

## 2012-03-06 MED ORDER — DOCUSATE SODIUM 100 MG PO CAPS
100.0000 mg | ORAL_CAPSULE | Freq: Two times a day (BID) | ORAL | Status: DC
  Administered 2012-03-06 – 2012-03-08 (×5): 100 mg via ORAL

## 2012-03-06 MED ORDER — DIPHENHYDRAMINE HCL 12.5 MG/5ML PO ELIX: 12.5000 mg | ORAL_SOLUTION | ORAL | Status: DC | PRN

## 2012-03-06 MED ORDER — SODIUM CHLORIDE 0.9 % IV SOLN: INTRAVENOUS | Status: DC

## 2012-03-06 MED ORDER — ZOLPIDEM TARTRATE 5 MG PO TABS
5.0000 mg | ORAL_TABLET | ORAL | Status: AC
  Administered 2012-03-06: 5 mg via ORAL
  Filled 2012-03-06: qty 1

## 2012-03-06 MED ORDER — ATORVASTATIN CALCIUM 10 MG PO TABS
10.0000 mg | ORAL_TABLET | Freq: Every day | ORAL | Status: DC
  Administered 2012-03-06 – 2012-03-07 (×2): 10 mg via ORAL
  Filled 2012-03-06 (×3): qty 1

## 2012-03-06 MED ORDER — ROCURONIUM BROMIDE 100 MG/10ML IV SOLN
INTRAVENOUS | Status: DC | PRN
  Administered 2012-03-06: 50 mg via INTRAVENOUS

## 2012-03-06 MED ORDER — GLYCOPYRROLATE 0.2 MG/ML IJ SOLN
INTRAMUSCULAR | Status: DC | PRN
  Administered 2012-03-06: .4 mg via INTRAVENOUS
  Administered 2012-03-06: 0.2 mg via INTRAVENOUS

## 2012-03-06 MED ORDER — PROMETHAZINE HCL 25 MG/ML IJ SOLN: 6.2500 mg | INTRAMUSCULAR | Status: DC | PRN

## 2012-03-06 MED ORDER — HETASTARCH-ELECTROLYTES 6 % IV SOLN
INTRAVENOUS | Status: DC | PRN
  Administered 2012-03-06: 12:00:00 via INTRAVENOUS

## 2012-03-06 MED ORDER — LIDOCAINE HCL (CARDIAC) 20 MG/ML IV SOLN
INTRAVENOUS | Status: DC | PRN
  Administered 2012-03-06: 80 mg via INTRAVENOUS

## 2012-03-06 MED ORDER — PROPOFOL 10 MG/ML IV BOLUS
INTRAVENOUS | Status: DC | PRN
  Administered 2012-03-06: 200 mg via INTRAVENOUS

## 2012-03-06 MED ORDER — ONDANSETRON HCL 4 MG/2ML IJ SOLN: 4.0000 mg | Freq: Four times a day (QID) | INTRAMUSCULAR | Status: DC | PRN

## 2012-03-06 MED ORDER — METHOCARBAMOL 100 MG/ML IJ SOLN
500.0000 mg | Freq: Four times a day (QID) | INTRAMUSCULAR | Status: DC | PRN
  Administered 2012-03-06: 500 mg via INTRAVENOUS
  Filled 2012-03-06 (×2): qty 5

## 2012-03-06 MED ORDER — BUPIVACAINE LIPOSOME 1.3 % IJ SUSP
20.0000 mL | Freq: Once | INTRAMUSCULAR | Status: AC
  Administered 2012-03-06: 20 mL
  Filled 2012-03-06: qty 20

## 2012-03-06 MED ORDER — ACETAMINOPHEN 10 MG/ML IV SOLN
INTRAVENOUS | Status: DC | PRN
  Administered 2012-03-06: 1000 mg via INTRAVENOUS

## 2012-03-06 MED ORDER — ACETAMINOPHEN 650 MG RE SUPP: 650.0000 mg | Freq: Four times a day (QID) | RECTAL | Status: DC | PRN

## 2012-03-06 MED ORDER — SUFENTANIL CITRATE 50 MCG/ML IV SOLN
INTRAVENOUS | Status: DC | PRN
  Administered 2012-03-06 (×4): 10 ug via INTRAVENOUS

## 2012-03-06 MED ORDER — HYDROMORPHONE HCL PF 1 MG/ML IJ SOLN
0.2500 mg | INTRAMUSCULAR | Status: DC | PRN
  Administered 2012-03-06 (×4): 0.5 mg via INTRAVENOUS

## 2012-03-06 MED ORDER — OXYCODONE HCL 5 MG PO TABS
5.0000 mg | ORAL_TABLET | ORAL | Status: DC | PRN
  Administered 2012-03-06: 5 mg via ORAL
  Administered 2012-03-06 – 2012-03-08 (×9): 10 mg via ORAL
  Filled 2012-03-06 (×11): qty 2

## 2012-03-06 MED ORDER — NEOSTIGMINE METHYLSULFATE 1 MG/ML IJ SOLN
INTRAMUSCULAR | Status: DC | PRN
  Administered 2012-03-06: 3 mg via INTRAVENOUS

## 2012-03-06 MED ORDER — CEFAZOLIN SODIUM-DEXTROSE 2-3 GM-% IV SOLR
2.0000 g | Freq: Four times a day (QID) | INTRAVENOUS | Status: AC
  Administered 2012-03-06 (×2): 2 g via INTRAVENOUS
  Filled 2012-03-06 (×2): qty 50

## 2012-03-06 MED ORDER — 0.9 % SODIUM CHLORIDE (POUR BTL) OPTIME
TOPICAL | Status: DC | PRN
  Administered 2012-03-06: 1000 mL

## 2012-03-06 SURGICAL SUPPLY — 49 items
BAG SPEC THK2 15X12 ZIP CLS (MISCELLANEOUS) ×1
BAG ZIPLOCK 12X15 (MISCELLANEOUS) ×2 IMPLANT
BIT DRILL 2.8X128 (BIT) ×2 IMPLANT
BLADE EXTENDED COATED 6.5IN (ELECTRODE) ×2 IMPLANT
BLADE SAW SAG 73X25 THK (BLADE) ×1
BLADE SAW SGTL 73X25 THK (BLADE) ×1 IMPLANT
CLOTH BEACON ORANGE TIMEOUT ST (SAFETY) ×2 IMPLANT
DECANTER SPIKE VIAL GLASS SM (MISCELLANEOUS) ×2 IMPLANT
DRAPE INCISE IOBAN 66X45 STRL (DRAPES) ×2 IMPLANT
DRAPE ORTHO SPLIT 77X108 STRL (DRAPES) ×4
DRAPE POUCH INSTRU U-SHP 10X18 (DRAPES) ×2 IMPLANT
DRAPE SURG ORHT 6 SPLT 77X108 (DRAPES) ×2 IMPLANT
DRAPE U-SHAPE 47X51 STRL (DRAPES) ×2 IMPLANT
DRSG ADAPTIC 3X8 NADH LF (GAUZE/BANDAGES/DRESSINGS) ×2 IMPLANT
DRSG MEPILEX BORDER 4X4 (GAUZE/BANDAGES/DRESSINGS) ×2 IMPLANT
DRSG MEPILEX BORDER 4X8 (GAUZE/BANDAGES/DRESSINGS) ×2 IMPLANT
DURAPREP 26ML APPLICATOR (WOUND CARE) ×2 IMPLANT
ELECT REM PT RETURN 9FT ADLT (ELECTROSURGICAL) ×2
ELECTRODE REM PT RTRN 9FT ADLT (ELECTROSURGICAL) ×1 IMPLANT
EVACUATOR 1/8 PVC DRAIN (DRAIN) ×2 IMPLANT
FACESHIELD LNG OPTICON STERILE (SAFETY) ×8 IMPLANT
GLOVE BIO SURGEON STRL SZ8 (GLOVE) ×2 IMPLANT
GLOVE BIOGEL PI IND STRL 8 (GLOVE) ×2 IMPLANT
GLOVE BIOGEL PI INDICATOR 8 (GLOVE) ×2
GLOVE ECLIPSE 8.0 STRL XLNG CF (GLOVE) ×2 IMPLANT
GOWN STRL NON-REIN LRG LVL3 (GOWN DISPOSABLE) ×4 IMPLANT
GOWN STRL REIN XL XLG (GOWN DISPOSABLE) ×2 IMPLANT
IMMOBILIZER KNEE 20 (SOFTGOODS) ×2
IMMOBILIZER KNEE 20 THIGH 36 (SOFTGOODS) IMPLANT
KIT BASIN OR (CUSTOM PROCEDURE TRAY) ×2 IMPLANT
MANIFOLD NEPTUNE II (INSTRUMENTS) ×2 IMPLANT
NDL SAFETY ECLIPSE 18X1.5 (NEEDLE) ×1 IMPLANT
NEEDLE HYPO 18GX1.5 SHARP (NEEDLE) ×2
NS IRRIG 1000ML POUR BTL (IV SOLUTION) ×2 IMPLANT
PACK TOTAL JOINT (CUSTOM PROCEDURE TRAY) ×2 IMPLANT
PASSER SUT SWANSON 36MM LOOP (INSTRUMENTS) ×2 IMPLANT
POSITIONER SURGICAL ARM (MISCELLANEOUS) ×2 IMPLANT
SPONGE GAUZE 4X4 12PLY (GAUZE/BANDAGES/DRESSINGS) ×2 IMPLANT
STRIP CLOSURE SKIN 1/2X4 (GAUZE/BANDAGES/DRESSINGS) ×4 IMPLANT
SUT ETHIBOND NAB CT1 #1 30IN (SUTURE) ×4 IMPLANT
SUT MNCRL AB 4-0 PS2 18 (SUTURE) ×2 IMPLANT
SUT VIC AB 2-0 CT1 27 (SUTURE) ×6
SUT VIC AB 2-0 CT1 TAPERPNT 27 (SUTURE) ×3 IMPLANT
SUT VLOC 180 0 24IN GS25 (SUTURE) ×3 IMPLANT
SYR 50ML LL SCALE MARK (SYRINGE) ×2 IMPLANT
TOWEL OR 17X26 10 PK STRL BLUE (TOWEL DISPOSABLE) ×4 IMPLANT
TOWEL OR NON WOVEN STRL DISP B (DISPOSABLE) ×2 IMPLANT
TRAY FOLEY CATH 14FRSI W/METER (CATHETERS) ×2 IMPLANT
WATER STERILE IRR 1500ML POUR (IV SOLUTION) ×3 IMPLANT

## 2012-03-06 NOTE — Progress Notes (Signed)
Utilization review completed.  

## 2012-03-06 NOTE — H&P (Signed)
Shawn Bell  DOB: 23-Dec-1940 Married / Language: English / Race: White Male  Date of Admission:  03/06/2012  Chief Complaint:  Left hip pain  History of Present Illness The patient is a 71 year old male who comes in today for a preoperative History and Physical. The patient is scheduled for a left total hip arthroplasty to be performed by Dr. Gus Rankin. Aluisio, MD at [facility] on 03/06/2012. The patient is a 71 year old male who presents with a hip problem. The patient reports left hip problems including pain, catching and stiffness symptoms that have been present for 4 year(s). The symptoms began without any known injury.The patient feels as if their symptoms are does feel they are worsening. Prior to being seen today the patient was previously evaluated by a colleague 9 year(s) ago. Previous workup for this problem has included pelvic x-rays and hip x-rays. This problem has not been previously treated. He feels as though the left hip is as bad as the right one prior to when he had it replaced. He remains extremely active. 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 He is on his feet most of the day everyday. He states that this hip is starting to make it more difficult for him to do things. He still is able to do what he desires, but with much more difficult fashion. He does get pain at night at times. He says his right hip is doing great. When he had the right hip replaced, he went home postop Day 1. He is now ready to get the left hip replaced. They have been treated conservatively in the past for the above stated problem and despite conservative measures, they continue to have progressive pain and severe functional limitations and dysfunction. They have failed non-operative management. It is felt that they would benefit from undergoing total joint replacement. Risks and benefits of the procedure have been discussed with the patient and they elect to proceed with surgery. There are  no active contraindications to surgery such as ongoing infection or rapidly progressive neurological disease.   Problem List/Past Medical Osteoarthritis, Hip (715.35) Sprain/strain, lumbar region (847.2). 08/19/2003   Allergies No Known Drug Allergies. 12/01/2011   Family History Hypertension. mother Rheumatoid Arthritis. mother and sister Mother. Deceased, Congestive Heart Failure. age 44 Father. Deceased, Rheumatic Heart Disease. age 70   Social History Living situation. live with spouse Marital status. married Illicit drug use. no Drug/Alcohol Rehab (Previously). no Exercise. Exercises daily; does running / walking Tobacco use. never smoker Tobacco / smoke exposure. no Number of flights of stairs before winded. greater than 5 Pain Contract. no Drug/Alcohol Rehab (Currently). no Children. 2 Current work status. working full time Alcohol use. current drinker; drinks wine; only occasionally per week Post-Surgical Plans. Plan is to go home and would like to arrange a hospital bed for home. Advance Directives. Living Will   Medication History Crestor (5MG  Tablet, Oral) Active. Centrum Silver ( Oral) Active. Super B Complex ( Oral) Active. Co Q-10 ( Oral) Specific dose unknown - Active. Fiber Complete ( Oral) Active. Bayer Low Dose (81MG  Tablet DR, Oral) Active.   Past Surgical History Total Hip Replacement. Date: 09/2002. right Arthroscopy of Knee. right Arthroscopy of Shoulder. bilateral   Other Problems Gallbladder Problems. Recent gall bladder flare requiring cholecystectomy   Review of Systems General:Not Present- Chills, Fever, Night Sweats, Fatigue, Weight Gain, Weight Loss and Memory Loss. Skin:Not Present- Hives, Itching, Rash, Eczema and Lesions. HEENT:Not Present- Tinnitus, Headache, Double Vision,  Visual Loss, Hearing Loss and Dentures. Respiratory:Not Present- Shortness of breath with exertion, Shortness of  breath at rest, Allergies, Coughing up blood and Chronic Cough. Cardiovascular:Not Present- Chest Pain, Racing/skipping heartbeats, Difficulty Breathing Lying Down, Murmur, Swelling and Palpitations. Gastrointestinal:Not Present- Bloody Stool, Heartburn, Abdominal Pain, Vomiting, Nausea, Constipation, Diarrhea, Difficulty Swallowing, Jaundice and Loss of appetitie. Male Genitourinary:Not Present- Urinary frequency, Blood in Urine, Weak urinary stream, Discharge, Flank Pain, Incontinence, Painful Urination, Urgency, Urinary Retention and Urinating at Night. Musculoskeletal:Present- Joint Pain. Not Present- Muscle Weakness, Muscle Pain, Joint Swelling, Back Pain, Morning Stiffness and Spasms. Neurological:Not Present- Tremor, Dizziness, Blackout spells, Paralysis, Difficulty with balance and Weakness. Psychiatric:Not Present- Insomnia.   Vitals Weight: 215 lb Height: 73 in Body Surface Area: 2.24 m Body Mass Index: 28.37 kg/m Pulse: 76 (Regular) Resp.: 16 (Unlabored) BP: 116/68 (Sitting, Right Arm, Standard)    Physical Exam The physical exam findings are as follows:  Note: Patinet is a 71 year old male with continued left hip pain.   General Mental Status - Alert, cooperative and good historian. General Appearance- pleasant. Not in acute distress. Orientation- Oriented X3. Build & Nutrition- Well nourished and Well developed.   Head and Neck Head- normocephalic, atraumatic . Neck Global Assessment- supple. no bruit auscultated on the right and no bruit auscultated on the left.   Eye Pupil- Bilateral- Regular and Round. Motion- Bilateral- EOMI.   Chest and Lung Exam Auscultation: Breath sounds:- clear at anterior chest wall and - clear at posterior chest wall. Adventitious sounds:- No Adventitious sounds.   Cardiovascular Auscultation:Rhythm- Regular rate and rhythm. Heart Sounds- S1 WNL and S2 WNL. Murmurs & Other Heart  Sounds:Auscultation of the heart reveals - No Murmurs.   Abdomen Palpation/Percussion:Tenderness- Abdomen is non-tender to palpation. Rigidity (guarding)- Abdomen is soft. Auscultation:Auscultation of the abdomen reveals - Bowel sounds normal.   Male Genitourinary  Not done, not pertinent to present illness  Musculoskeletal He is alert and oriented in no apparent distress. Left hip flexed to 95. No internal rotation, about 10-20 of external rotation, 20 abduction. Right hip flexion to 110, rotation in 20, out 30, abducted 30 without discomfort.  RADIOGRAPHS: AP pelvis and a lateral of the left hip showed that he has severe bone on bone arthritis of the left hip with flattening of the femoral head and significant subchondral cystic formation. Right hip is AML stem with a pinnacle metal on metal construct.  Assessment & Plan Osteoarthritis, Hip (715.35) Impression: Left Hip  Note: Patient is for a left total hip replacement by Dr. Lequita Halt.  Plan is to go home after the hospital stay.  He would like to get a hosptial bed for home.  PCP - Dr. Celedonio Savage - Patient has been seen preoperatively and felt to be stable for surgery.  Signed electronically by Roberts Gaudy, PA-C

## 2012-03-06 NOTE — Op Note (Signed)
Pre-operative diagnosis- Osteoarthritis Left hip  Post-operative diagnosis- Osteoarthritis  Left hip  Procedure-  LeftTotal Hip Arthroplasty  Surgeon- Gus Rankin. Savon Bordonaro, MD  Assistant- Leilani Able, PA-C   Anesthesia  General  EBL- 450   Drain Hemovac   Complication- None  Condition-PACU - hemodynamically stable.   Brief Clinical Note- Shawn Bell is a 71 y.o. male with end stage arthritis of his left hip with progressively worsening pain and dysfunction. Pain occurs with activity and rest including pain at night. He has tried analgesics, protected weight bearing and rest without benefit. Pain is too severe to attempt physical therapy. Radiographs demonstrate bone on bone arthritis with subchondral cyst formation. He presents now for left THA.  Procedure in detail-   The patient is brought into the operating room and placed on the operating table. After successful administration of General  anesthesia, the patient is placed in the  Right lateral decubitus position with the  Left side up and held in place with the hip positioner. The lower extremity is isolated from the perineum with plastic drapes and time-out is performed by the surgical team. The lower extremity is then prepped and draped in the usual sterile fashion. A short posterolateral incision is made with a ten blade through the subcutaneous tissue to the level of the fascia lata which is incised in line with the skin incision. The sciatic nerve is palpated and protected and the short external rotators and capsule are isolated from the femur. The hip is then dislocated and the center of the femoral head is marked. A trial prosthesis is placed such that the trial head corresponds to the center of the patients' native femoral head. The resection level is marked on the femoral neck and the resection is made with an oscillating saw. The femoral head is removed and femoral retractors placed to gain access to the femoral canal.      The  canal finder is passed into the femoral canal and the canal is thoroughly irrigated with sterile saline to remove the fatty contents. Axial reaming is performed to 13.5  mm, proximal reaming to 78F  and the sleeve machined to a large. A 18 F large trial sleeve is placed into the proximal femur.      The femur is then retracted anteriorly to gain acetabular exposure. Acetabular retractors are placed and the labrum and osteophytes are removed, Acetabular reaming is performed to 57  mm and a 58  mm Pinnacle acetabular shell is placed in anatomic position with excellent purchase. Additional dome screws were not needed. The permanent 36 mm neutral + 4 Marathon liner is placed into the acetabular shell.      The trial femur is then placed into the femoral canal. The size is 18 x 13  stem with a 36 + 12  neck and a 36 + 3 head with the neck version 10 degrees beyond  the patients' native anteversion. The hip is reduced with excellent stability with full extension and full external rotation, 70 degrees flexion with 40 degrees adduction and 90 degrees internal rotation and 90 degrees of flexion with 70 degrees of internal rotation. The operative leg is placed on top of the non-operative leg and the leg lengths are found to be equal. The trials are then removed and the permanent implant of the same size is impacted into the femoral canal. The ceramic femoral head of the same size as the trial is placed and the hip is reduced with the same  stability parameters. The operative leg is again placed on top of the non-operative leg and the leg lengths are found to be equal.      The wound is then copiously irrigated with saline solution and the capsule and short external rotators are re-attached to the femur through drill holes with Ethibond suture. The fascia lata is closed over a hemovac drain with #1 vicryl suture and the fascia lata, gluteal muscles and subcutaneous tissues are injected with Exparel 20ml diluted with saline  50ml. The subcutaneous tissues are closed with #1 and2-0 vicryl and the subcuticular layer closed with running 4-0 Monocryl. The drain is hooked to suction, incision cleaned and dried, and steri-srips and a bulky sterile dressing applied. The limb is placed into a knee immobilizer and the patient is awakened and transported to recovery in stable condition.      Please note that a surgical assistant was a medical necessity for this procedure in order to perform it in a safe and expeditious manner. The assistant was necessary to provide retraction to the vital neurovascular structures and to retract and position the limb to allow for anatomic placement of the prosthetic components.  Gus Rankin Kaylie Ritter, MD    03/06/2012, 12:03 PM

## 2012-03-06 NOTE — Anesthesia Preprocedure Evaluation (Addendum)
Anesthesia Evaluation  Patient identified by MRN, date of birth, ID band Patient awake    Reviewed: Allergy & Precautions, H&P , NPO status , Patient's Chart, lab work & pertinent test results  Airway Mallampati: II TM Distance: >3 FB Neck ROM: Full    Dental  (+) Dental Advisory Given and Teeth Intact   Pulmonary neg pulmonary ROS,  breath sounds clear to auscultation  Pulmonary exam normal       Cardiovascular Exercise Tolerance: Good negative cardio ROS  Rhythm:Regular Rate:Normal     Neuro/Psych negative neurological ROS  negative psych ROS   GI/Hepatic negative GI ROS, Neg liver ROS,   Endo/Other  negative endocrine ROS  Renal/GU negative Renal ROS     Musculoskeletal  (+) Arthritis -, Osteoarthritis,    Abdominal   Peds  Hematology negative hematology ROS (+)   Anesthesia Other Findings   Reproductive/Obstetrics                         Anesthesia Physical Anesthesia Plan  ASA: II  Anesthesia Plan: General   Post-op Pain Management:    Induction: Intravenous  Airway Management Planned: Oral ETT  Additional Equipment:   Intra-op Plan:   Post-operative Plan: Extubation in OR  Informed Consent: I have reviewed the patients History and Physical, chart, labs and discussed the procedure including the risks, benefits and alternatives for the proposed anesthesia with the patient or authorized representative who has indicated his/her understanding and acceptance.   Dental advisory given  Plan Discussed with: CRNA and Surgeon  Anesthesia Plan Comments:        Anesthesia Quick Evaluation

## 2012-03-06 NOTE — Transfer of Care (Signed)
Immediate Anesthesia Transfer of Care Note  Patient: Shawn Bell  Procedure(s) Performed: Procedure(s) (LRB) with comments: TOTAL HIP ARTHROPLASTY (Left)  Patient Location: PACU  Anesthesia Type: General  Level of Consciousness: awake, alert , oriented, patient cooperative and responds to stimulation  Airway & Oxygen Therapy: Patient Spontanous Breathing and Patient connected to face mask oxygen  Post-op Assessment: Report given to PACU RN, Post -op Vital signs reviewed and stable and Patient moving all extremities X 4  Post vital signs: stable  Complications: No apparent anesthesia complications

## 2012-03-06 NOTE — Interval H&P Note (Signed)
History and Physical Interval Note:  03/06/2012 10:05 AM  Shawn Bell  has presented today for surgery, with the diagnosis of osteoarthritis left hip  The various methods of treatment have been discussed with the patient and family. After consideration of risks, benefits and other options for treatment, the patient has consented to  Procedure(s) (LRB) with comments: TOTAL HIP ARTHROPLASTY (Left) as a surgical intervention .  The patient's history has been reviewed, patient examined, no change in status, stable for surgery.  I have reviewed the patient's chart and labs.  Questions were answered to the patient's satisfaction.     Loanne Drilling

## 2012-03-06 NOTE — Anesthesia Postprocedure Evaluation (Signed)
Anesthesia Post Note  Patient: Shawn Bell  Procedure(s) Performed: Procedure(s) (LRB): TOTAL HIP ARTHROPLASTY (Left)  Anesthesia type: General  Patient location: PACU  Post pain: Pain level controlled  Post assessment: Post-op Vital signs reviewed  Last Vitals: BP 108/66  Pulse 56  Temp 36.4 C  Resp 12  Ht 6\' 1"  (1.854 m)  Wt 213 lb (96.616 kg)  BMI 28.10 kg/m2  SpO2 97%  Post vital signs: Reviewed  Level of consciousness: sedated  Complications: No apparent anesthesia complications

## 2012-03-07 ENCOUNTER — Encounter (HOSPITAL_COMMUNITY): Payer: Self-pay | Admitting: Orthopedic Surgery

## 2012-03-07 DIAGNOSIS — E871 Hypo-osmolality and hyponatremia: Secondary | ICD-10-CM | POA: Diagnosis not present

## 2012-03-07 LAB — BASIC METABOLIC PANEL
CO2: 23 mEq/L (ref 19–32)
GFR calc non Af Amer: 81 mL/min — ABNORMAL LOW (ref >90)
Glucose, Bld: 147 mg/dL — ABNORMAL HIGH (ref 70–99)
Potassium: 4.1 mEq/L (ref 3.5–5.1)
Sodium: 134 mEq/L — ABNORMAL LOW (ref 135–145)

## 2012-03-07 LAB — CBC
Hemoglobin: 10.8 g/dL — ABNORMAL LOW (ref 13.0–17.0)
MCHC: 34.3 g/dL (ref 30.0–36.0)
Platelets: 190 10*3/uL (ref 150–400)
RBC: 3.41 MIL/uL — ABNORMAL LOW (ref 4.22–5.81)

## 2012-03-07 MED ORDER — ZOLPIDEM TARTRATE 10 MG PO TABS
10.0000 mg | ORAL_TABLET | Freq: Once | ORAL | Status: AC
  Administered 2012-03-07: 10 mg via ORAL
  Filled 2012-03-07: qty 1

## 2012-03-07 NOTE — Progress Notes (Signed)
   Subjective: 1 Day Post-Op Procedure(s) (LRB): TOTAL HIP ARTHROPLASTY (Left) Patient reports pain as mild.   Patient seen in rounds with Dr. Lequita Halt. Patient is well, and has had no acute complaints or problems We will start therapy today.  Plan is to go Home after hospital stay.  Objective: Vital signs in last 24 hours: Temp:  [97.3 F (36.3 C)-98 F (36.7 C)] 97.8 F (36.6 C) (09/19 0448) Pulse Rate:  [54-89] 89  (09/19 0448) Resp:  [11-18] 16  (09/19 0748) BP: (103-129)/(56-75) 108/68 mmHg (09/19 0448) SpO2:  [95 %-100 %] 96 % (09/19 0748) Weight:  [96.2 kg (212 lb 1.3 oz)-96.616 kg (213 lb)] 96.616 kg (213 lb) (09/18 1402)  Intake/Output from previous day:  Intake/Output Summary (Last 24 hours) at 03/07/12 0845 Last data filed at 03/07/12 0748  Gross per 24 hour  Intake 4056.67 ml  Output   3955 ml  Net 101.67 ml    Intake/Output this shift: Total I/O In: 360 [P.O.:360] Out: -   Labs:  Basename 03/07/12 0432  HGB 10.8*    Basename 03/07/12 0432  WBC 11.3*  RBC 3.41*  HCT 31.5*  PLT 190    Basename 03/07/12 0432  NA 134*  K 4.1  CL 102  CO2 23  BUN 13  CREATININE 0.93  GLUCOSE 147*  CALCIUM 8.0*   No results found for this basename: LABPT:2,INR:2 in the last 72 hours  EXAM General - Patient is Alert, Appropriate and Oriented Extremity - Neurovascular intact Sensation intact distally Dorsiflexion/Plantar flexion intact Dressing - dressing C/D/I Motor Function - intact, moving foot and toes well on exam.  Hemovac pulled without difficulty.  Past Medical History  Diagnosis Date  . Diverticulosis of colon (without mention of hemorrhage)   . Hemorrhoids   . Personal history of colonic polyps 01/23/2003  . Hyperlipemia   . History of prostate cancer     S/P RADIATION [place]  . Cancer     Assessment/Plan: 1 Day Post-Op Procedure(s) (LRB): TOTAL HIP ARTHROPLASTY (Left) Principal Problem:  *OA (osteoarthritis) of hip Active Problems:  Postop Hyponatremia   Advance diet Up with therapy Plan for discharge tomorrow Discharge home with home health  DVT Prophylaxis - Xarelto, 81 mg Aspirin on hold for now. Weight Bearing As Tolerated left Leg D/C Knee Immobilizer Hemovac Pulled Begin Therapy Hip Preacutions No vaccines.  PERKINS, ALEXZANDREW 03/07/2012, 8:45 AM

## 2012-03-07 NOTE — Progress Notes (Signed)
Physical Therapy Treatment Patient Details Name: Shawn Bell MRN: [id number] DOB: 1941-01-18 Today's Date: 03/07/2012 Time: 4098-1191 PT Time Calculation (min): 26 min  PT Assessment / Plan / Recommendation Comments on Treatment Session  Pt performed exercises and ambulated again in hallway.    Follow Up Recommendations  Home health PT    Barriers to Discharge        Equipment Recommendations  None recommended by PT    Recommendations for Other Services    Frequency     Plan Discharge plan remains appropriate;Frequency remains appropriate    Precautions / Restrictions Precautions Precautions: Posterior Hip Precaution Comments: reviewed all precautions Restrictions LLE Weight Bearing: Weight bearing as tolerated   Pertinent Vitals/Pain No pain, repositioned, ice applied    Mobility  Bed Mobility Bed Mobility: Supine to Sit;Sit to Supine Supine to Sit: 6: Modified independent (Device/Increase time);HOB elevated Sit to Supine: 6: Modified independent (Device/Increase time);HOB flat Transfers Transfers: Stand to Sit;Sit to Stand Sit to Stand: 4: Min guard;With upper extremity assist;From bed Stand to Sit: 4: Min guard;With upper extremity assist;To bed Details for Transfer Assistance: verbal cues for safe technique and maintaining hip precautions Ambulation/Gait Ambulation/Gait Assistance: 4: Min guard Ambulation Distance (Feet): 120 Feet Assistive device: Rolling walker Ambulation/Gait Assistance Details:   verbal cue for technique, vc to use UE on RW to assist since pt step through pattern  Gait Pattern: Step-through pattern;Antalgic    Exercises Total Joint Exercises Ankle Circles/Pumps: AROM;Both;20 reps Quad Sets: AROM;Both;20 reps Heel Slides: AROM;Left;20 reps;Other (comment) (within precautions) Hip ABduction/ADduction: AROM;Strengthening;Left;20 reps Straight Leg Raises: AROM;Strengthening;Left;10 reps   PT Diagnosis:    PT Problem List:   PT Treatment  Interventions:     PT Goals Acute Rehab PT Goals PT Goal: Sit to Stand - Progress: Progressing toward goal PT Goal: Stand to Sit - Progress: Progressing toward goal PT Goal: Ambulate - Progress: Progressing toward goal PT Goal: Perform Home Exercise Program - Progress: Progressing toward goal  Visit Information  Last PT Received On: 03/07/12 Assistance Needed: +1    Subjective Data  Subjective: Let's walk again.   Cognition  Overall Cognitive Status: Appears within functional limits for tasks assessed/performed    Balance     End of Session PT - End of Session Activity Tolerance: Patient tolerated treatment well Patient left: in bed;with call bell/phone within reach   GP     [facility] E 03/07/2012, 3:07 PM Pager: [phone]

## 2012-03-07 NOTE — Evaluation (Signed)
Physical Therapy Evaluation Patient Details Name: Shawn Bell MRN: [id number] DOB: 01/12/1941 Today's Date: 03/07/2012 Time: 4098-1191 PT Time Calculation (min): 23 min  PT Assessment / Plan / Recommendation Clinical Impression  Pt s/p L THR.  Pt would benefit from acute PT services in order to improve strength in L LE and maintain hip precautions to safely increase independence with transfers, ambulation, and stairs to prepare for d/c home.    PT Assessment  Patient needs continued PT services    Follow Up Recommendations  Home health PT    Barriers to Discharge        Equipment Recommendations  None recommended by PT    Recommendations for Other Services     Frequency 7X/week    Precautions / Restrictions Precautions Precautions: Posterior Hip Precaution Comments: given handout Restrictions Weight Bearing Restrictions: Yes LLE Weight Bearing: Weight bearing as tolerated   Pertinent Vitals/Pain 1/10 L hip pain, premedicated, repositioned, ice applied      Mobility  Bed Mobility Bed Mobility: Supine to Sit Supine to Sit: 6: Modified independent (Device/Increase time);HOB elevated (with trapeze) Details for Bed Mobility Assistance: pt plans on having hospital bed at home. Transfers Transfers: Stand to Sit;Sit to Stand Sit to Stand: 4: Min guard;With upper extremity assist;From bed Stand to Sit: 4: Min guard;With upper extremity assist;To chair/3-in-1 Details for Transfer Assistance: verbal cues for safe technique and maintaining hip precautions Ambulation/Gait Ambulation/Gait Assistance: 4: Min guard Ambulation Distance (Feet): 120 Feet Assistive device: Rolling walker Ambulation/Gait Assistance Details: verbal cue for technique, vc to use UE on RW to assist since pt step through pattern Gait Pattern: Step-through pattern;Antalgic    Exercises     PT Diagnosis: Difficulty walking  PT Problem List: Decreased strength;Decreased activity tolerance;Decreased  mobility;Decreased knowledge of precautions;Decreased range of motion PT Treatment Interventions: DME instruction;Gait training;Functional mobility training;Stair training;Therapeutic activities;Therapeutic exercise;Patient/family education   PT Goals Acute Rehab PT Goals PT Goal Formulation: With patient Time For Goal Achievement: 03/14/12 Potential to Achieve Goals: Good Pt will go Sit to Stand: with modified independence PT Goal: Sit to Stand - Progress: Goal set today Pt will go Stand to Sit: with modified independence PT Goal: Stand to Sit - Progress: Goal set today Pt will Ambulate: >150 feet;with least restrictive assistive device;with modified independence PT Goal: Ambulate - Progress: Goal set today Pt will Go Up / Down Stairs: 1-2 stairs;with modified independence;with least restrictive assistive device PT Goal: Up/Down Stairs - Progress: Goal set today Pt will Perform Home Exercise Program: with supervision, verbal cues required/provided PT Goal: Perform Home Exercise Program - Progress: Goal set today  Visit Information  Last PT Received On: 03/07/12 Assistance Needed: +1    Subjective Data  Subjective: I have four houses.   Prior Functioning  Home Living Lives With: Spouse Type of Home: House Home Access: Stairs to Biomedical scientist of Steps: 2 Entrance Stairs-Rails: Right Bathroom Toilet: Handicapped height Home Adaptive Equipment: Straight cane;Walker - rolling;Bedside commode/3-in-1 Prior Function Level of Independence: Independent Vocation: Full time employment Communication Communication: No difficulties    Cognition  Overall Cognitive Status: Appears within functional limits for tasks assessed/performed Arousal/Alertness: Awake/alert Orientation Level: Appears intact for tasks assessed Behavior During Session: [facility] for tasks performed    Extremity/Trunk Assessment Right Upper Extremity Assessment RUE ROM/Strength/Tone: [facility] for  tasks assessed Left Upper Extremity Assessment LUE ROM/Strength/Tone: [facility] for tasks assessed Right Lower Extremity Assessment RLE ROM/Strength/Tone: [facility] for tasks assessed Left Lower Extremity Assessment LLE ROM/Strength/Tone: Deficits;Due to precautions;Unable to  fully assess LLE ROM/Strength/Tone Deficits: weak hip musculature per functional observation   Balance    End of Session PT - End of Session Equipment Utilized During Treatment: Gait belt Activity Tolerance: Patient tolerated treatment well Patient left: in chair;with call bell/phone within reach  GP     Mali Eppard,KATHrine E 03/07/2012, 10:41 AM Pager: [phone]

## 2012-03-08 LAB — BASIC METABOLIC PANEL
BUN: 12 mg/dL (ref 6–23)
Creatinine, Ser: 0.97 mg/dL (ref 0.50–1.35)
GFR calc Af Amer: >90 mL/min (ref >90)
GFR calc non Af Amer: 80 mL/min — ABNORMAL LOW (ref >90)
Potassium: 4.3 mEq/L (ref 3.5–5.1)

## 2012-03-08 LAB — CBC
HCT: 31.5 % — ABNORMAL LOW (ref 39.0–52.0)
Hemoglobin: 10.6 g/dL — ABNORMAL LOW (ref 13.0–17.0)
MCHC: 33.7 g/dL (ref 30.0–36.0)
MCV: 92.9 fL (ref 78.0–100.0)
Platelets: 175 10*3/uL (ref 150–400)
RBC: 3.39 MIL/uL — ABNORMAL LOW (ref 4.22–5.81)
RDW: 13.1 % (ref 11.5–15.5)
WBC: 9.1 10*3/uL (ref 4.0–10.5)

## 2012-03-08 MED ORDER — RIVAROXABAN 10 MG PO TABS: 10.0000 mg | ORAL_TABLET | Freq: Every day | ORAL | Status: DC

## 2012-03-08 MED ORDER — ZOLPIDEM TARTRATE 5 MG PO TABS: 5.0000 mg | ORAL_TABLET | Freq: Every evening | ORAL | Status: DC | PRN

## 2012-03-08 MED ORDER — OXYCODONE HCL 5 MG PO TABS: 5.0000 mg | ORAL_TABLET | ORAL | Status: DC | PRN

## 2012-03-08 MED ORDER — METHOCARBAMOL 500 MG PO TABS: 500.0000 mg | ORAL_TABLET | Freq: Four times a day (QID) | ORAL | Status: DC | PRN

## 2012-03-08 NOTE — Progress Notes (Signed)
   Subjective: 2 Days Post-Op Procedure(s) (LRB): TOTAL HIP ARTHROPLASTY (Left) Patient reports pain as mild.   Patient seen in rounds for Dr. Lequita Halt. Patient is well, and has had no acute complaints or problems.  He has some soreness but he preop pain is essentially gone. Patient is ready to go home later today.  Objective: Vital signs in last 24 hours: Temp:  [97.4 F (36.3 C)-98.8 F (37.1 C)] 98.8 F (37.1 C) (09/20 0543) Pulse Rate:  [63-78] 74  (09/20 0543) Resp:  [16-18] 16  (09/20 0751) BP: (106-128)/(65-75) 128/73 mmHg (09/20 0543) SpO2:  [95 %-98 %] 95 % (09/20 0543)  Intake/Output from previous day:  Intake/Output Summary (Last 24 hours) at 03/08/12 0907 Last data filed at 03/08/12 0751  Gross per 24 hour  Intake 1375.67 ml  Output   2150 ml  Net -774.33 ml    Intake/Output this shift: Total I/O In: -  Out: 125 [Urine:125]  Labs:  [facility] 03/08/12 0415 03/07/12 0432  HGB 10.6* 10.8*    Basename 03/08/12 0415 03/07/12 0432  WBC 9.1 11.3*  RBC 3.39* 3.41*  HCT 31.5* 31.5*  PLT 175 190    Basename 03/08/12 0415 03/07/12 0432  NA 136 134*  K 4.3 4.1  CL 103 102  CO2 28 23  BUN 12 13  CREATININE 0.97 0.93  GLUCOSE 103* 147*  CALCIUM 8.2* 8.0*   No results found for this basename: LABPT:2,INR:2 in the last 72 hours  EXAM: General - Patient is Alert, Appropriate and Oriented Extremity - Neurovascular intact Sensation intact distally Dorsiflexion/Plantar flexion intact No cellulitis present Incision - clean, dry, no drainage, healing Motor Function - intact, moving foot and toes well on exam.   Assessment/Plan: 2 Days Post-Op Procedure(s) (LRB): TOTAL HIP ARTHROPLASTY (Left) Procedure(s) (LRB): TOTAL HIP ARTHROPLASTY (Left) Past Medical History  Diagnosis Date  . Diverticulosis of colon (without mention of hemorrhage)   . Hemorrhoids   . Personal history of colonic polyps 01/23/2003  . Hyperlipemia   . History of prostate cancer    [place]/P RADIATION [place]  . Cancer    Principal Problem:  *OA (osteoarthritis) of hip Active Problems:  Postop Hyponatremia   Discharge home with home health Diet - Regular diet Follow up - in 2 weeks Activity - WBAT Disposition - Home Condition Upon Discharge - Good D/C Meds - See DC Summary DVT Prophylaxis - Xarelto,81 mg Aspirin on hold for now.   PERKINS, ALEXZANDREW 03/08/2012, 9:07 AM

## 2012-03-08 NOTE — Progress Notes (Signed)
Discharge summary sent to payer through [employer]  

## 2012-03-08 NOTE — Progress Notes (Signed)
Home health care arranged with [facility]; talked to Venia Minks RN with [facility], she is awaiting on approval for [facility] from the insurance company; [employer]

## 2012-03-08 NOTE — Progress Notes (Signed)
Pt to d/c home. AVS reviewed and "My Chart" was discussed with pt. Pt capable of verbalizing medications and follow-up appointments with Dr. Lequita Halt. Remains hemodynamically stable. No signs and symptoms of distress. Educated pt to return to ER in the case of SOB, dizziness, or chest pain.

## 2012-03-08 NOTE — Evaluation (Signed)
Occupational Therapy Evaluation Patient Details Name: Shawn Bell MRN: [id number] DOB: 09-Feb-1941 Today's Date: 03/08/2012 Time: 6213-0865 OT Time Calculation (min): 17 min  OT Assessment / Plan / Recommendation Clinical Impression  This 71 y.o. male admitted for THA.  Pt demonstrates decreased awareness of THA precautions and safety, but seems somewhat indifferent to instruction.  All instructions have been given.  Pt. is for discharge today.  Pt. reports he will have 24 hour assist at discharge and doesn't need further OT.  Will sign off    OT Assessment  Patient does not need any further OT services    Follow Up Recommendations  No OT follow up;Supervision/Assistance - 24 hour    Barriers to Discharge      Equipment Recommendations  None recommended by OT    Recommendations for Other Services    Frequency       Precautions / Restrictions Precautions Precautions: Posterior Hip Precaution Comments: reviewed all precautions - Pt. requires mod verbal cues to for back precautions during hypothetical situations Restrictions Weight Bearing Restrictions: No LLE Weight Bearing: Weight bearing as tolerated       ADL  Grooming: Simulated;Wash/dry hands;Wash/dry face;Teeth care;Brushing hair;Set up Where Assessed - Grooming: Supported sitting Upper Body Bathing: Simulated;Set up Where Assessed - Upper Body Bathing: Supported sitting Lower Body Bathing: Simulated;Moderate assistance Where Assessed - Lower Body Bathing: Supported sit to stand Upper Body Dressing: Simulated;Set up Where Assessed - Upper Body Dressing: Unsupported sitting Lower Body Dressing: Simulated;Minimal assistance (with AE) Where Assessed - Lower Body Dressing: Supported sit to stand Toilet Transfer: Simulated;Min Pension scheme manager Method: Sit to Barista: Raised toilet seat with arms (or 3-in-1 over toilet) Toileting - Clothing Manipulation and Hygiene:  Simulated;Supervision/safety Where Assessed - Engineer, mining and Hygiene: Standing Tub/Shower Transfer: Simulated;Min guard Tub/Shower Transfer Method: Science writer: Walk in Scientist, research (physical sciences) Used: Reacher Transfers/Ambulation Related to ADLs: Pt. impulsively moved into standing position using unsafe techniques to scoot up EOB.  Cautioned pt to use RW when standing ambulating ADL Comments: Pt. very elusive with answers re: precautions, techniques for ADLs.  States, "I know what to do"  "I've got everything at home, I've got a home gym so I am good to go"  When pt. pressed to verbalize or return demonstration of safe technique for LB ADLs, pt. required mod verbal cues to do so.  Pt. unable to recall "no bending".  He reports he will have help with LB ADLs x 1 week.  Informed pt. to discuss length of THA precautions with MD for specifics but to plan on maintaining precautions for 8-12 weeks.   Pt. then states he will have his maid assist him with LB ADLs as long as needed.  Also discussed safe seat height for built in shower chair and safe method for maintaining THA precautions when moving sit to stand.  Pt. verbalized understanding     OT Diagnosis:    OT Problem List:   OT Treatment Interventions:     OT Goals    Visit Information  Last OT Received On: 03/08/12 Assistance Needed: +1    Subjective Data  Subjective: "I have nine bathrooms, I will be fine" Patient Stated Goal: To go to in conference in [place] in Oct.   Prior Functioning  Vision/Perception  Home Living Lives With: Spouse Available Help at Discharge: Available 24 hours/day (Pt. reports he has a Social research officer, government and a nurse) Type of Home: House Home Access: Stairs to [employer]  of Steps: 2 Entrance Stairs-Rails: Right Bathroom Shower/Tub: Walk-in shower (built in seat) Bathroom Toilet: Handicapped height Bathroom Accessibility: Yes How Accessible: Accessible via  walker Home Adaptive Equipment: Straight cane;Walker - rolling;Bedside commode/3-in-1;Reacher;Built-in shower seat Prior Function Level of Independence: Independent Able to Take Stairs?: Yes Driving: Yes Vocation: Full time employment Communication Communication: No difficulties      Cognition  Overall Cognitive Status: Appears within functional limits for tasks assessed/performed Arousal/Alertness: Awake/alert Orientation Level: Appears intact for tasks assessed Behavior During Session: [facility] for tasks performed Cognition - Other Comments: Pt. with decreased safety, but anticipate this is pt. baseline    Extremity/Trunk Assessment Right Upper Extremity Assessment RUE ROM/Strength/Tone: WFL for tasks assessed RUE Coordination: WFL - gross/fine motor Left Upper Extremity Assessment LUE ROM/Strength/Tone: WFL for tasks assessed LUE Coordination: WFL - gross/fine motor Trunk Assessment Trunk Assessment: Normal   Mobility  Shoulder Instructions  Bed Mobility Bed Mobility: Sit to Supine Sit to Supine: 6: Modified independent (Device/Increase time) (with use of trapeze which he reports he has at home) Transfers Transfers: Stand to Sit;Sit to Stand Sit to Stand: 4: Min guard;With upper extremity assist;From bed Stand to Sit: 4: Min guard;With upper extremity assist;To bed Details for Transfer Assistance: Pt. impulsively stood at EOB without walker, grabbing for chair.  Cautioned pt. to always have RW with him at all times       Exercise     Balance     End of Session OT - End of Session Activity Tolerance: Patient tolerated treatment well Patient left: in bed;with call bell/phone within reach  GO     Willoughby Doell M 03/08/2012, 11:14 AM

## 2012-03-08 NOTE — Plan of Care (Signed)
Problem: Consults Goal: Diagnosis- Total Joint Replacement Outcome: Completed/Met Date Met:  03/08/12 Primary Total Hip

## 2012-03-08 NOTE — Progress Notes (Signed)
Physical Therapy Treatment Patient Details Name: Shawn Bell MRN: [id number] DOB: [birth year] Today's Date: 03/08/2012 Time: 4098-1191 PT Time Calculation (min): 23 min  PT Assessment / Plan / Recommendation Comments on Treatment Session  Pt ambulated in hallway and performed exercises.  Pt reports no stairs to enter house since he has elevator.    Follow Up Recommendations  Home health PT    Barriers to Discharge        Equipment Recommendations  None recommended by PT    Recommendations for Other Services    Frequency     Plan Discharge plan remains appropriate;Frequency remains appropriate    Precautions / Restrictions Precautions Precautions: Posterior Hip Precaution Comments: reviewed all precautions - Pt. requires mod verbal cues to for back precautions during hypothetical situations Restrictions Weight Bearing Restrictions: No LLE Weight Bearing: Weight bearing as tolerated   Pertinent Vitals/Pain Pt reported premedicated, will use call bell if needs more meds, repositioned in recliner    Mobility  Bed Mobility Bed Mobility: Supine to Sit Supine to Sit: 6: Modified independent (Device/Increase time) Sit to Supine: 6: Modified independent (Device/Increase time) (with use of trapeze which he reports he has at home) Transfers Transfers: Stand to Sit;Sit to Stand Sit to Stand: 4: Min guard;With upper extremity assist;From bed Stand to Sit: 4: Min guard;With upper extremity assist;To chair/3-in-1 Details for Transfer Assistance: verbal cues for safe technique, hip precautions Ambulation/Gait Ambulation/Gait Assistance: 4: Min guard Ambulation Distance (Feet): 120 Feet Assistive device: Rolling walker Ambulation/Gait Assistance Details: verbal cue for RW distance, turning toward R side Gait Pattern: Step-through pattern;Antalgic    Exercises Total Joint Exercises Ankle Circles/Pumps: AROM;Both;20 reps Quad Sets: AROM;Both;20 reps Gluteal Sets: AROM;Both;20  reps Short Arc Quad: AROM;Strengthening;Left;20 reps Heel Slides: Left;20 reps;AAROM Hip ABduction/ADduction: AAROM;Strengthening;Left;20 reps Straight Leg Raises: AAROM;Strengthening;Left;20 reps Long Arc Quad: AROM;Strengthening;Left;15 reps   PT Diagnosis:    PT Problem List:   PT Treatment Interventions:     PT Goals Acute Rehab PT Goals PT Goal: Sit to Stand - Progress: Progressing toward goal PT Goal: Stand to Sit - Progress: Progressing toward goal PT Goal: Ambulate - Progress: Progressing toward goal PT Goal: Up/Down Stairs - Progress: Discontinued (comment) PT Goal: Perform Home Exercise Program - Progress: Progressing toward goal  Visit Information  Last PT Received On: 03/08/12 Assistance Needed: +1    Subjective Data  Subjective: They want me to work with you twice before I leave.   Cognition  Overall Cognitive Status: Appears within functional limits for tasks assessed/performed Arousal/Alertness: Awake/alert Orientation Level: Appears intact for tasks assessed Behavior During Session: [facility] for tasks performed Cognition - Other Comments: Pt. with decreased safety, but anticipate this is pt. baseline    Balance     End of Session PT - End of Session Activity Tolerance: Patient tolerated treatment well Patient left: in chair;with call bell/phone within reach   GP     Vikash Nest,KATHrine E 03/08/2012, 1:11 PM Pager: [phone]

## 2012-03-08 NOTE — Progress Notes (Signed)
Physical Therapy Treatment Note   03/08/12 1500  PT Visit Information  Last PT Received On 03/08/12  Assistance Needed +1  PT Time Calculation  PT Start Time 1339  PT Stop Time 1349  PT Time Calculation (min) 10 min  Subjective Data  Subjective I get to leave once I'm finished with you.  Precautions  Precautions Posterior Hip  Precaution Comments reviewed all precautions  Restrictions  LLE Weight Bearing WBAT  Cognition  Overall Cognitive Status Appears within functional limits for tasks assessed/performed  Transfers  Transfers Stand to Sit;Sit to Stand  Sit to Stand 4: Min guard;With upper extremity assist;From chair/3-in-1  Stand to Sit 4: Min guard;With upper extremity assist;To chair/3-in-1  Details for Transfer Assistance verbal cue for L LE forward  Ambulation/Gait  Ambulation/Gait Assistance 4: Min guard  Ambulation Distance (Feet) 150 Feet  Assistive device Rolling walker  Ambulation/Gait Assistance Details pt reports soreness and cued to use upper body more to assist with pain  Gait Pattern Step-through pattern;Antalgic  Stairs Yes  Stairs Assistance 4: Min guard  Stairs Assistance Details (indicate cue type and reason) verbal cues for safe technique  Stair Management Technique Two rails;Step to pattern;Forwards  Number of Stairs 2   Total Joint Exercises  Heel Slides Left;20 reps;AAROM  Hip ABduction/ADduction AAROM;Strengthening;Left;20 reps  PT - End of Session  Activity Tolerance Patient tolerated treatment well  Patient left in chair;with call bell/phone within reach  Nurse Communication Patient requests pain meds  PT - Assessment/Plan  Comments on Treatment Session Pt ambulated again and feels ready for d/c home.  Instructed pt in car transfer and demonstrated.  Pt had no further questions/concerns.  PT Plan Discharge plan remains appropriate;Frequency remains appropriate  Follow Up Recommendations Home health PT  Equipment Recommended None recommended by  PT  Acute Rehab PT Goals  PT Goal: Sit to Stand - Progress Progressing toward goal  PT Goal: Stand to Sit - Progress Progressing toward goal  PT Goal: Ambulate - Progress Progressing toward goal  PT Goal: Perform Home Exercise Program - Progress Progressing toward goal  PT General Charges  $$ ACUTE PT VISIT 1 Procedure  PT Treatments  $Gait Training 8-22 mins    Zenovia Jarred, PT Pager: [phone]

## 2012-03-08 NOTE — Discharge Summary (Signed)
Physician Discharge Summary   Patient ID: Shawn Bell MRN: [id number] DOB/AGE: 73-Feb-1942 73 y.o.  Admit date: 03/06/2012 Discharge date: 03/08/2012  Primary Diagnosis: Osteoarthritis Left hip  Admission Diagnoses:  Past Medical History  Diagnosis Date  . Diverticulosis of colon (without mention of hemorrhage)   . Hemorrhoids   . Personal history of colonic polyps 01/23/2003  . Hyperlipemia   . History of prostate cancer     S/P RADIATION [place]  . Cancer    Discharge Diagnoses:   Principal Problem:  *OA (osteoarthritis) of hip Active Problems:  Postop Hyponatremia  Procedure: Procedure(s) (LRB): TOTAL HIP ARTHROPLASTY (Left)   Consults: None  HPI: Shawn Bell is a 71 y.o. male with end stage arthritis of his left hip with progressively worsening pain and dysfunction. Pain occurs with activity and rest including pain at night. He has tried analgesics, protected weight bearing and rest without benefit. Pain is too severe to attempt physical therapy. Radiographs demonstrate bone on bone arthritis with subchondral cyst formation. He presents now for left THA.   Laboratory Data: Hospital Outpatient Visit on 03/01/2012  Component Date Value Range Status  . ABO/RH(D) 03/01/2012 A NEG   Final    Basename 03/08/12 0415 03/07/12 0432  HGB 10.6* 10.8*    Basename 03/08/12 0415 03/07/12 0432  WBC 9.1 11.3*  RBC 3.39* 3.41*  HCT 31.5* 31.5*  PLT 175 190    Basename 03/08/12 0415 03/07/12 0432  NA 136 134*  K 4.3 4.1  CL 103 102  CO2 28 23  BUN 12 13  CREATININE 0.97 0.93  GLUCOSE 103* 147*  CALCIUM 8.2* 8.0*   No results found for this basename: LABPT:2,INR:2 in the last 72 hours  X-Rays:Dg Hip Complete Left  03/01/2012  *RADIOLOGY REPORT*  Clinical Data: End-stage osteoarthritis.  Left hip pain.  Preop.  LEFT HIP - COMPLETE 2+ VIEW  Comparison: None.  Findings: Severe degenerative changes are present  in the left hip with near complete loss of joint space.   Subchondral cyst formation along with sclerotic change can be seen in the acetabulum and femoral head.  There is a prior unremarkable appearing right total hip replacement.  Prostate seeds overlie the pubis.  IMPRESSION: Severe left hip DJD.   Original Report Authenticated By: Elsie Stain, M.D.    Dg Pelvis Portable  03/06/2012  *RADIOLOGY REPORT*  Clinical Data: Post hip replacement.   Degenerative changes.  PORTABLE PELVIS,PORTABLE LEFT HIP - 1 VIEW  Comparison: 03/01/2012.  Findings: Post total left hip replacement which appears in satisfactory position on this single projection without complication noted.  Surgical drain is in place.  Remote right hip replacement.  IMPRESSION: Post total left hip replacement which appears in satisfactory position on this single projection without complication noted.   Original Report Authenticated By: Fuller Canada, M.D.    Dg Hip Portable 1 View Left  03/06/2012  *RADIOLOGY REPORT*  Clinical Data: Post hip replacement.   Degenerative changes.  PORTABLE PELVIS,PORTABLE LEFT HIP - 1 VIEW  Comparison: 03/01/2012.  Findings: Post total left hip replacement which appears in satisfactory position on this single projection without complication noted.  Surgical drain is in place.  Remote right hip replacement.  IMPRESSION: Post total left hip replacement which appears in satisfactory position on this single projection without complication noted.   Original Report Authenticated By: Fuller Canada, M.D.    Dg C-arm 61-120 Min-no Report  02/07/2012  CLINICAL DATA: gallbladder   C-ARM 61-120 MINUTES  Fluoroscopy was utilized by the requesting physician.  No radiographic  interpretation.      EKG: Orders placed during the hospital encounter of 01/28/12  . EKG 12-LEAD  . EKG 12-LEAD  . ED EKG  . ED EKG  . EKG     Hospital Course: Patient was admitted to [facility] and taken to the OR and underwent the above state procedure without complications.  Patient  tolerated the procedure well and was later transferred to the recovery room and then to the orthopaedic floor for postoperative care.  They were given PO and IV analgesics for pain control following their surgery.  They were given 24 hours of postoperative antibiotics and started on DVT prophylaxis in the form of Xarelto.   PT and OT were ordered for total hip protocol.  The patient was allowed to be WBAT with therapy. Discharge planning was consulted to help with postop disposition and equipment needs.  Patient had a decent night on the evening of surgery and started to get up OOB with therapy on day one.  Hemovac drain was pulled without difficulty.  The knee immobilizer was removed and discontinued.  Continued to work with therapy into day two.  Dressing was changed on day two and the incision was healing well.  Patient was seen in rounds by Dr. Lequita Halt and was fel to be ready to go home later that afternoon.  Discharge Medications: Prior to Admission medications   Medication Sig Start Date End Date Taking? Authorizing Provider  methocarbamol (ROBAXIN) 500 MG tablet Take 1 tablet (500 mg total) by mouth every 6 (six) hours as needed. 03/08/12   Gina Costilla, PA  oxyCODONE (OXY IR/ROXICODONE) 5 MG immediate release tablet Take 1-2 tablets (5-10 mg total) by mouth every 4 (four) hours as needed for pain. 03/08/12   Tasnim Balentine Julien Girt, PA  rivaroxaban (XARELTO) 10 MG TABS tablet Take 1 tablet (10 mg total) by mouth daily with breakfast. Take Xarelto for two and a half more weeks, then discontinue Xarelto. Once the patient has completed the Xarelto, they may resume the 81 mg Aspirin. 03/08/12   Ronnica Dreese, PA  rosuvastatin (CRESTOR) 5 MG tablet Take 5 mg by mouth at bedtime.     Historical Provider, MD  zolpidem (AMBIEN) 5 MG tablet Take 1-2 tablets (5-10 mg total) by mouth at bedtime as needed for sleep. 03/08/12   Martell Mcfadyen Julien Girt, PA    Diet: Cardiac diet Activity:WBAT No bending hip  over 90 degrees- A "L" Angle Do not cross legs Do not let foot roll inward When turning these patients a pillow should be placed between the patient's legs to prevent crossing. Patients should have the affected knee fully extended when trying to sit or stand from all surfaces to prevent excessive hip flexion. When ambulating and turning toward the affected side the affected leg should have the toes turned out prior to moving the walker and the rest of patient's body as to prevent internal rotation/ turning in of the leg. Abduction pillows are the most effective way to prevent a patient from not crossing legs or turning toes in at rest. If an abduction pillow is not ordered placing a regular pillow length wise between the patient's legs is also an effective reminder. It is imperative that these precautions be maintained so that the surgical hip does not dislocate. Follow-up:in 2 weeks Disposition - Home Discharged Condition: good   Discharge Orders    Future Orders Please Complete By Expires   Diet -  low sodium heart healthy      Call MD / Call 911      Comments:   If you experience chest pain or shortness of breath, CALL 911 and be transported to the hospital emergency room.  If you develope a fever above 101 F, pus (white drainage) or increased drainage or redness at the wound, or calf pain, call your surgeon's office.   Discharge instructions      Comments:   Pick up stool softner and laxative for home. Do not submerge incision under water. May shower. Continue to use ice for pain and swelling from surgery. Hip precautions.  Total Hip Protocol.  Take Xarelto for two and a half more weeks, then discontinue Xarelto. Once the patient has completed the Xarelto, they may resume the 81 mg Aspirin.   Constipation Prevention      Comments:   Drink plenty of fluids.  Prune juice may be helpful.  You may use a stool softener, such as Colace (over the counter) 100 mg twice a day.  Use MiraLax  (over the counter) for constipation as needed.   Increase activity slowly as tolerated      Patient may shower      Comments:   You may shower without a dressing once there is no drainage.  Do not wash over the wound.  If drainage remains, do not shower until drainage stops.   Driving restrictions      Comments:   No driving until released by the physician.   Lifting restrictions      Comments:   No lifting until released by the physician.   Follow the hip precautions as taught in Physical Therapy      Change dressing      Comments:   You may change your dressing dressing daily with sterile 4 x 4 inch gauze dressing and paper tape.  Do not submerge the incision under water.   TED hose      Comments:   Use stockings (TED hose) for 3 weeks on both leg(s).  You may remove them at night for sleeping.   Do not sit on low chairs, stoools or toilet seats, as it may be difficult to get up from low surfaces          Medication List     As of 03/08/2012  9:14 AM    STOP taking these medications         AMBULATORY NON FORMULARY MEDICATION      aspirin 81 MG tablet      b complex vitamins tablet      CENTRUM SILVER PO      CoQ-10 150 MG Caps      FIBERCON PO      TAKE these medications         methocarbamol 500 MG tablet   Commonly known as: ROBAXIN   Take 1 tablet (500 mg total) by mouth every 6 (six) hours as needed.      oxyCODONE 5 MG immediate release tablet   Commonly known as: Oxy IR/ROXICODONE   Take 1-2 tablets (5-10 mg total) by mouth every 4 (four) hours as needed for pain.      rivaroxaban 10 MG Tabs tablet   Commonly known as: XARELTO   Take 1 tablet (10 mg total) by mouth daily with breakfast. Take Xarelto for two and a half more weeks, then discontinue Xarelto.  Once the patient has completed the Xarelto, they may resume the 81 mg  Aspirin.      rosuvastatin 5 MG tablet   Commonly known as: CRESTOR   Take 5 mg by mouth at bedtime.      zolpidem 5 MG  tablet   Commonly known as: AMBIEN   Take 1-2 tablets (5-10 mg total) by mouth at bedtime as needed for sleep.           Follow-up Information    Follow up with Loanne Drilling, MD. Schedule an appointment as soon as possible for a visit in 2 weeks.   Contact information:   [facility] [address], [address] [place] [address] [phone]          Signed: Patrica Duel 03/08/2012, 9:14 AM

## 2012-11-07 ENCOUNTER — Other Ambulatory Visit: Payer: Self-pay | Admitting: Internal Medicine

## 2012-12-23 ENCOUNTER — Other Ambulatory Visit: Payer: Self-pay | Admitting: Physician Assistant

## 2013-01-01 ENCOUNTER — Telehealth (INDEPENDENT_AMBULATORY_CARE_PROVIDER_SITE_OTHER): Payer: Self-pay | Admitting: *Deleted

## 2013-01-01 NOTE — Telephone Encounter (Signed)
Patient called in to request an appt to see Dr. Daphine Deutscher due to swelling and spotted bleeding of his hemorrhoids.  Scheduled patient for first available which is 01/30/13 however patient asking for earlier appt with Dr. Daphine Deutscher.  Patient aware a message will be sent to [place] and Dr. Daphine Deutscher to see if they are able to find anything earlier in his schedule.  Patient agreeable with POC at this time.

## 2013-01-30 ENCOUNTER — Ambulatory Visit (INDEPENDENT_AMBULATORY_CARE_PROVIDER_SITE_OTHER): Payer: BC Managed Care – PPO | Admitting: Surgery

## 2013-01-30 ENCOUNTER — Encounter (INDEPENDENT_AMBULATORY_CARE_PROVIDER_SITE_OTHER): Payer: Self-pay | Admitting: Surgery

## 2013-01-30 VITALS — BP 120/78 | HR 72 | Temp 98.1°F | Resp 14 | Ht 73.0 in | Wt 221.0 lb

## 2013-01-30 DIAGNOSIS — L290 Pruritus ani: Secondary | ICD-10-CM

## 2013-01-30 MED ORDER — PRAMOXINE HCL 1 % RE FOAM: RECTAL | Status: DC | PRN

## 2013-01-30 NOTE — Patient Instructions (Signed)
Anal Itching Itching around the anus is a common problem. It is usually not dangerous. It often is caused by skin irritation from stool, moisture, soaps, or clothing. Other causes are pinworms, especially if the itching is worse at night. In adults, the itching may be due to hemorrhoids. In some cases, the cause is unknown. Itching usually can be controlled by keeping the anal area clean and dry. CAUSES   Loose or sticky stool from diarrhea or rectal leakage.   Hemorrhoids. They allow stool to stick to the rectal area.   Certain foods. Be sure to discuss your diet with your caregiver.   Dry skin or skin diseases.   Infections such as a local yeast infection or certain sexually transmitted diseases (STDs).   Worms (parasites).   Diseases of the anus. These include abscesses, fissures, fistulas or cancer.   Sometimes a cause cannot be found.  DIAGNOSIS   Your caregiver will take your history and examine you. A careful exam of the anus is important. Your caregiver will inspect the outer area of your anus and will do a rectal exam.   Sometimes your caregiver will need to look inside the anus. This is a simple procedure that may be a little uncomfortable but usually does not require anesthesia.   If abnormalities are found, a biopsy might be done or you may be referred to a specialist.  TREATMENT  The treatment of your condition will depend on the cause.   Your caregiver will advise you on treatment of any disease found.   If you have rectal leakage or loose stools, a diet high in fiber or a fiber supplement should improve your condition.   Avoid foods or substances that might be causing your itching.   Gentle care of your anal area is important to avoid worsening the irritation.  HOME CARE INSTRUCTIONS  Do not rub or scratch the area. This makes the itching worse. It could worsen conditions such as parasite infections.   After every bowel movement and at bedtime, gently clean the  anal area. Bathe or use moistened tissue or soft wash cloth. You also may use pre-moistened anal cleansing pads or tissues made for cleaning up babies. Do not use soap. Gently pat the area dry.   Wear underwear made of cotton or with a cotton crotch. Do not wear tight fitting clothes or underwear that keep moisture in.   Avoid foods and beverages that may cause anal itching. Examples are beer, tea, coffee, milk, cola, tomatoes, citrus fruits, nuts, chocolate, and spicy foods.   Be sure you have enough fiber in your diet.   Do not use products that may irritate the anal skin. These include perfumed or colored toilet paper, deodorant sprays, and perfumed soaps.   Do not use any medication on the anal area unless advised. Some products may make itching worse.   It may take a few weeks for things to fully improve.  SEEK MEDICAL CARE IF:   The itching is not better in 3 to 4 days or is getting worse.   The skin around the anus becomes red or tender. This may be a sign of infection.   You have pain in the anus, especially with bowel movement.  SEEK IMMEDIATE MEDICAL CARE IF:  You have increasing pain in the anus or in the abdomen.   You have blood coming from the anus.   You have pus or other discharge from the anus.   You develop a fever.  Document Released: 06/02/2000 Document Revised: 05/25/2011 Document Reviewed: 06/09/2008 [facility] Patient Information 2012 [place], [place].  Use proctofoam as needed.   Will recheck in 3 weeks

## 2013-01-30 NOTE — Progress Notes (Signed)
Shawn Bell comes in complaining of irritation around his anus. I examined him and no really see any external hemorrhoids or skin tags. He has a very long hair think is causing it to be difficult in hygiene and I shaved that. Anteriorly he does have a white patch along a little area of breakdown. This looks like hyperkeratotic reaction. I prescribed some Proctofoam and also told him to use Tucks after he has a bowel movement. When checking this in 3 weeks and see if this will heal. If not we'll consider biopsy.  He also asked me to check his prostate. His recent PSA was not elevated. I did a prostate exam and palpated it is both lobes of his prostate and felt no nodules or irregularities.  He is followed by Dr. Tod Persia.  REturn 3 weeks

## 2013-02-05 ENCOUNTER — Encounter (INDEPENDENT_AMBULATORY_CARE_PROVIDER_SITE_OTHER): Payer: Self-pay

## 2013-02-21 ENCOUNTER — Encounter (INDEPENDENT_AMBULATORY_CARE_PROVIDER_SITE_OTHER): Payer: Self-pay | Admitting: Surgery

## 2013-02-21 ENCOUNTER — Ambulatory Visit (INDEPENDENT_AMBULATORY_CARE_PROVIDER_SITE_OTHER): Payer: BC Managed Care – PPO | Admitting: Surgery

## 2013-02-21 VITALS — BP 122/82 | HR 72 | Temp 97.2°F | Resp 14 | Ht 73.0 in | Wt 221.6 lb

## 2013-02-21 DIAGNOSIS — K602 Anal fissure, unspecified: Secondary | ICD-10-CM

## 2013-02-21 NOTE — Progress Notes (Signed)
Shawn Bell 72 y.o.  Body mass index is 29.24 kg/(m^2).  Patient Active Problem List   Diagnosis Date Noted  . Postop Hyponatremia 03/07/2012  . OA (osteoarthritis) of hip 03/06/2012  . Cholelithiasis with cholecystitis 01/30/2012  . Prostate cancer 01/02/2012  . Lipid disorder 01/02/2012  . Heme positive stool 01/02/2012  . DIVERTICULOSIS-COLON 10/23/2008  . ANAL FISSURE 10/23/2008    No Known Allergies  Past Surgical History  Procedure Laterality Date  . Total hip arthroplasty      right  . Shoulder arthroscopy    . Joint replacement    . Cholecystectomy  02/07/2012    Procedure: LAPAROSCOPIC CHOLECYSTECTOMY WITH INTRAOPERATIVE CHOLANGIOGRAM;  Surgeon: Velora Heckler, MD;  Location: [facility];  Service: General;  Laterality: N/A;  . Total hip arthroplasty  03/06/2012    Procedure: TOTAL HIP ARTHROPLASTY;  Surgeon: Loanne Drilling, MD;  Location: [facility];  Service: Orthopedics;  Laterality: Left;   GUEST, [name], MD No diagnosis found.  Mr. Schepers returns today. I had Dr. Ezzard Standing see him with me. He has these white patches anterior and some redness and moisture. We discussed his diet and he is eating a whole lot to my is and this could be a seasonal allergy to his right and. I asked him to cut those out of his diet and to apply zinc oxide cream in the morning at bedtime after using Tucks. I'll see him back in about 6 weeks as he is doing.  The exam today there were some white areas in the midline raphae anteriorly and there was luminal area of skin breakdown about 3 cm from the anal verge. He has a slime of stool along the skin around there. Where I had trimmed his hair was still clean. [name] Daphine Deutscher, MD, [facility] [phone] beeper [phone]  02/21/2013 3:23 PM

## 2013-02-21 NOTE — Patient Instructions (Signed)
Minimize tomatoes in diet. Apply zinc oxide cream to anal area in the morning and at  Bedtime.  Apply after cleansing the area with TUCKS

## 2013-02-26 ENCOUNTER — Other Ambulatory Visit: Payer: Self-pay | Admitting: Physician Assistant

## 2013-03-06 IMAGING — CT CT ABD-PELV W/O CM
1 of 4 series · 13 of 32 positions shown, 18 images · non-contrast
Comparison: None.

CLINICAL DATA: Abdominal pain radiating to the back.  Abdominal
aortic aneurysm.

CT ABDOMEN AND PELVIS WITHOUT CONTRAST
TECHNIQUE: Multidetector CT imaging of the abdomen and pelvis was
performed following the standard protocol without intravenous
contrast.

[Series 2: abd/pel w/o · axial · non-contrast · 0.74mm/px · z∈[+742,+1142]mm · 13 of 91 slices shown, 18 images]
[im 6/91  soft-tissue]
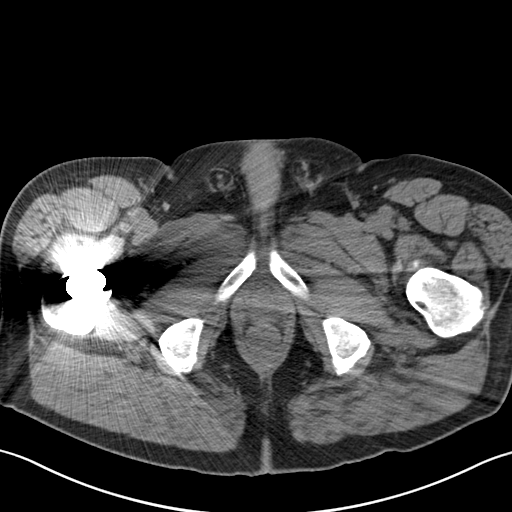
[im 6/91  bone]
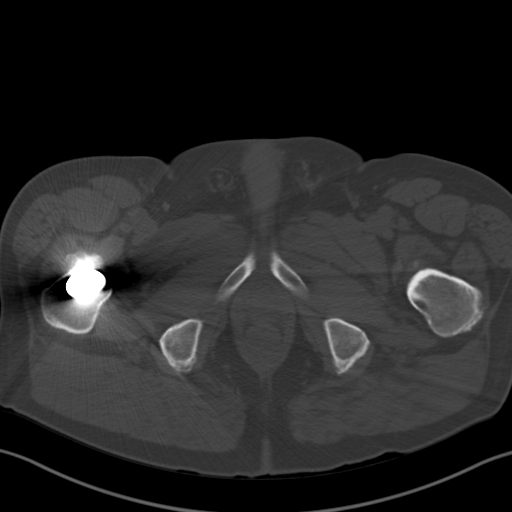
[im 16/91  soft-tissue]
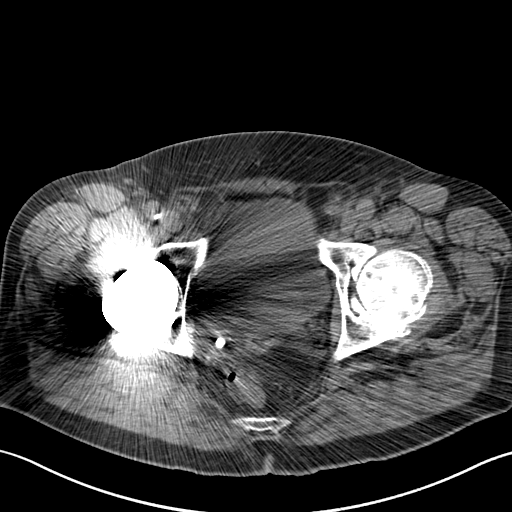
[im 21/91  soft-tissue]
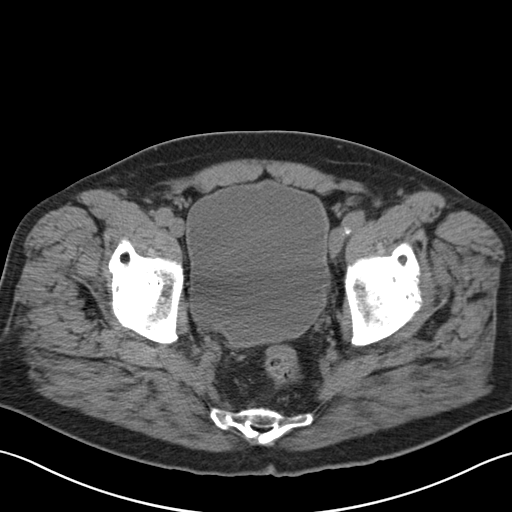
[im 26/91  soft-tissue]
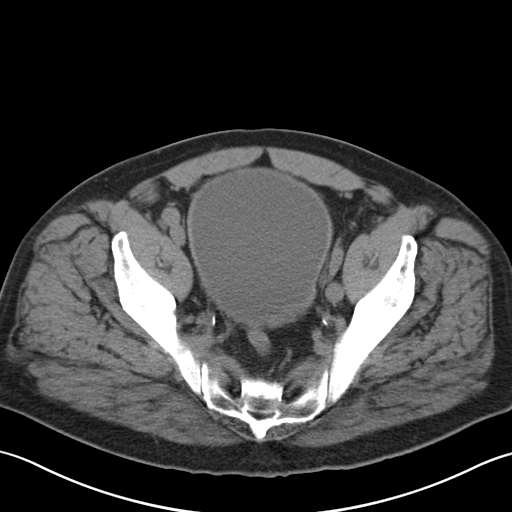
[im 36/91  soft-tissue]
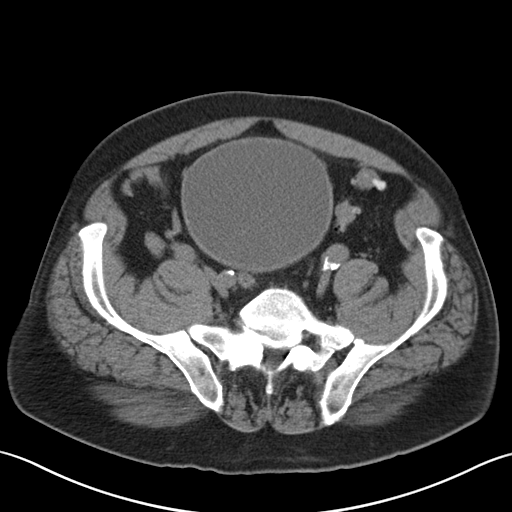
[im 41/91  soft-tissue]
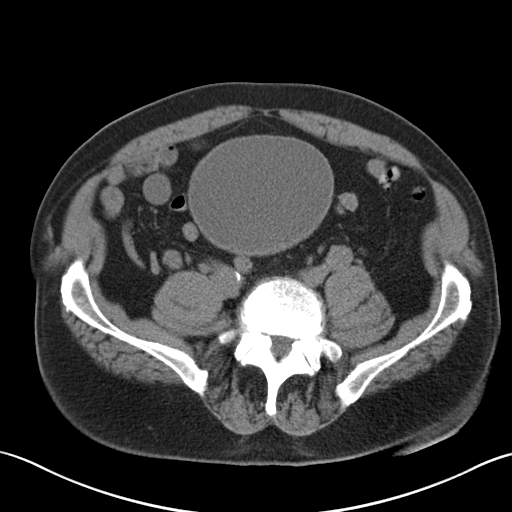
[im 51/91  soft-tissue]
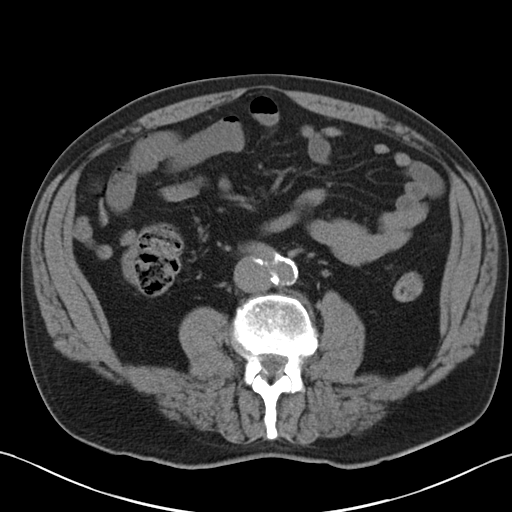
[im 56/91  soft-tissue]
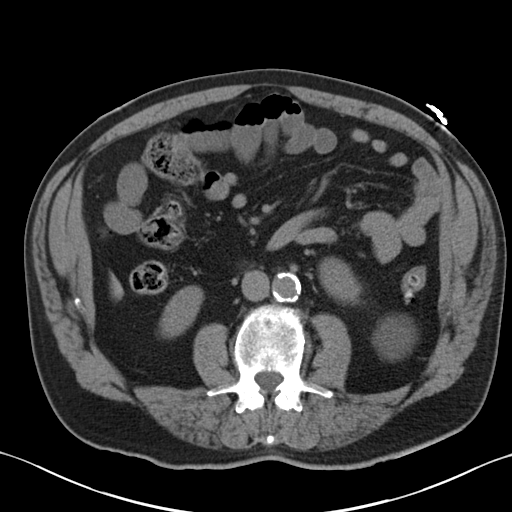
[im 66/91  soft-tissue]
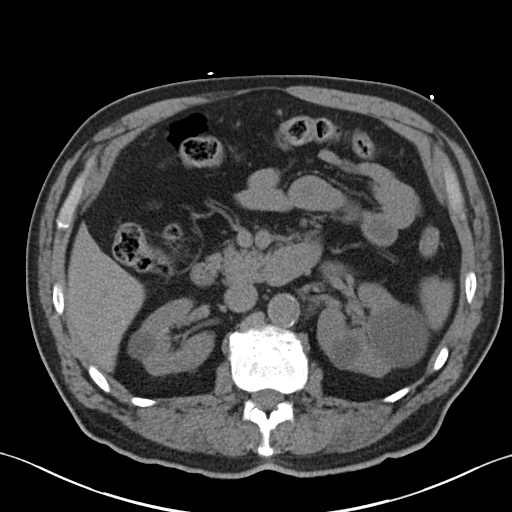
[im 66/91  bone]
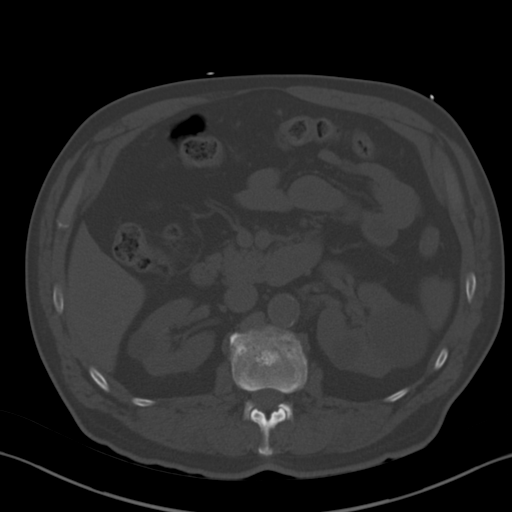
[im 71/91  soft-tissue]
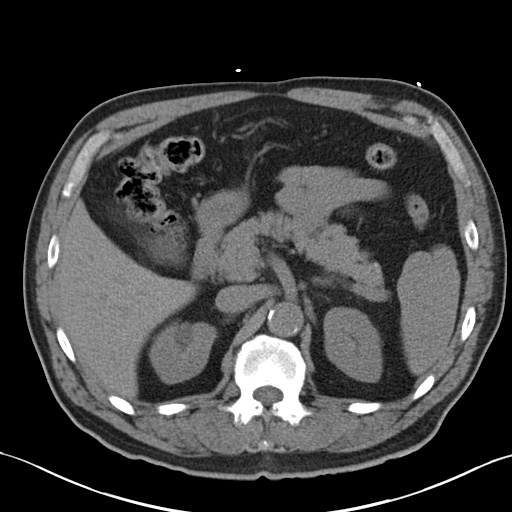
[im 71/91  lung]
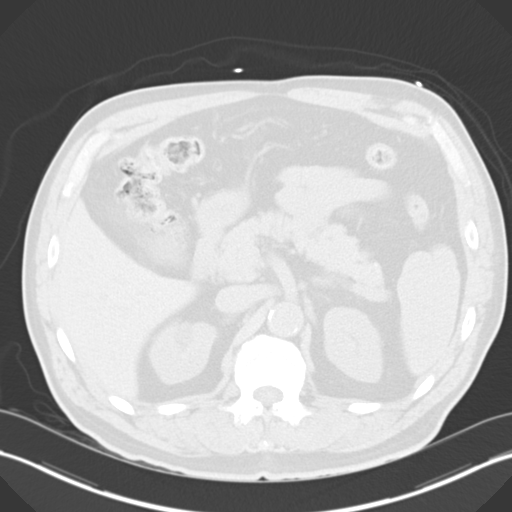
[im 76/91  soft-tissue]
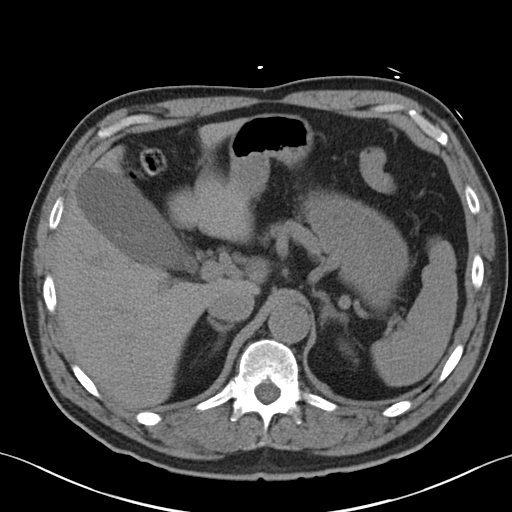
[im 76/91  lung]
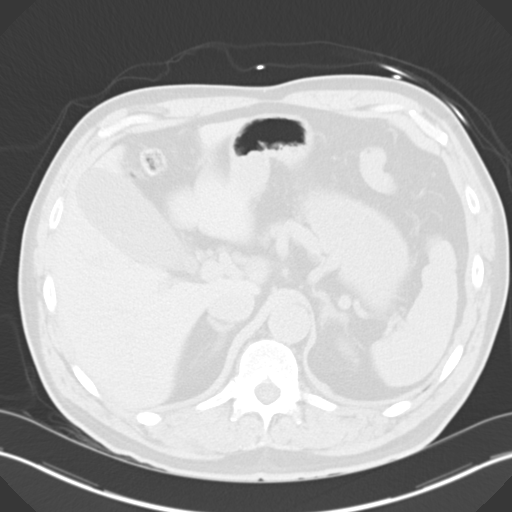
[im 81/91  lung]
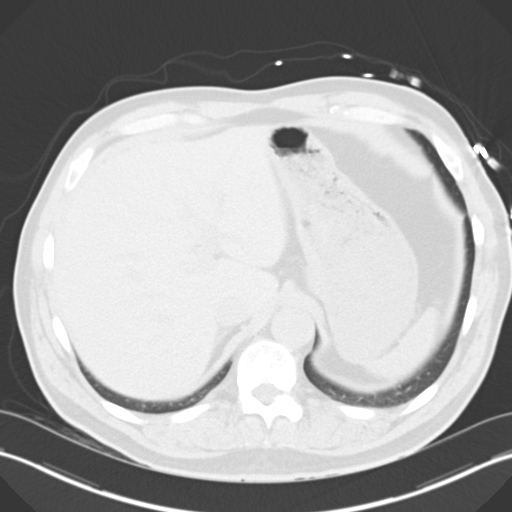
[im 86/91  soft-tissue]
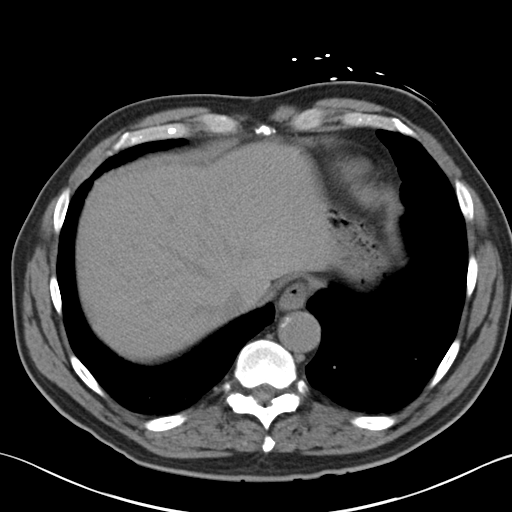
[im 86/91  lung]
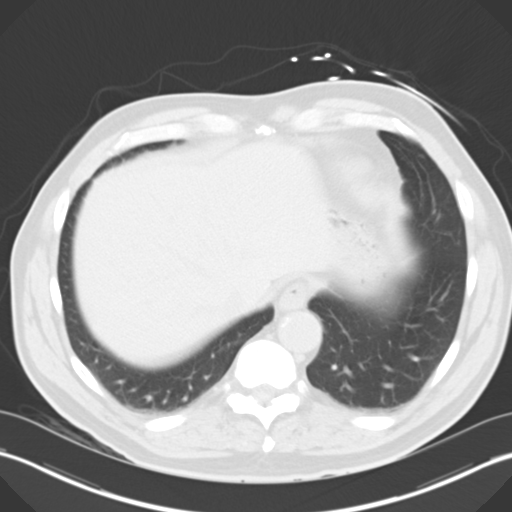

[13 of 32 positions shown; findings below may reference images not displayed]

FINDINGS: Lung Bases: Dependent atelectasis at the lung bases.
Coronary artery and descending thoracic aortic atherosclerosis.

Liver:  Unenhanced CT was performed per clinician order.  Lack of
IV contrast limits sensitivity and specificity, especially for
evaluation of abdominal/pelvic solid viscera.  Low density sub
centimeter lesion in the left hepatic lobe, probably a cyst.  No
biliary ductal dilation.

Spleen:  Normal.

Gallbladder:  Cholelithiasis is present.  Largest stone measures 27
mm and is partially calcified.  No CT evidence of acute
cholecystitis.

Common bile duct:  Normal.

Pancreas:  Normal.

Adrenal glands:  Normal.

Kidneys:  Bilateral renal low density lesions, most compatible with
cysts.  No stones or obstruction.  The ureters are within normal
limits.

Stomach:  Normal appearance of the stomach.

Small bowel:  Within normal limits.  No small bowel mesenteric
adenopathy.

Colon:   Colonic diverticulosis without diverticulitis.

Pelvic Genitourinary:  Pelvis partially obscured by right total hip
arthroplasty.  Prostate brachytherapy seeds.  No free fluid.
Prostate produces an impression on the inferior urinary bladder.

Urinary bladder is markedly distended.  The patient may benefit
from Foley catheter placement.  This raises possibility of urinary
retention or bladder outflow obstruction.

Bones:  No aggressive osseous lesions.  Severe left hip
osteoarthritis with subchondral cystic changes on both sides of the
joint.  The right sacroiliac joint degenerative disease.  Lumbar
spondylosis and scoliosis.  Right supra-acetabular ilium
postoperative changes.

Vasculature: Severe aortoiliac atherosclerosis is present without
aneurysm.  The common iliac arteries measure up to 18 mm.  No acute
vascular abnormality is identified.

On sagittal imaging, the long axis of the bladder is 20.7 cm.
IMPRESSION: 1.  Marked distention of the urinary bladder.  Question urinary
retention versus bladder outflow obstruction. The patient may
benefit from Foley catheter placement.
2.  Atherosclerosis without acute vascular abnormality.  No
abdominal aortic aneurysm.
3.  Renal and hepatic cysts.
4.  Cholelithiasis.
5.  Coronary artery disease.

## 2013-04-04 ENCOUNTER — Encounter (INDEPENDENT_AMBULATORY_CARE_PROVIDER_SITE_OTHER): Payer: BC Managed Care – PPO | Admitting: Surgery

## 2013-04-30 ENCOUNTER — Ambulatory Visit (INDEPENDENT_AMBULATORY_CARE_PROVIDER_SITE_OTHER): Payer: BC Managed Care – PPO | Admitting: Surgery

## 2013-04-30 ENCOUNTER — Encounter (INDEPENDENT_AMBULATORY_CARE_PROVIDER_SITE_OTHER): Payer: Self-pay | Admitting: Surgery

## 2013-04-30 VITALS — BP 142/68 | HR 76 | Resp 16 | Ht 73.0 in | Wt 219.8 lb

## 2013-04-30 DIAGNOSIS — K602 Anal fissure, unspecified: Secondary | ICD-10-CM

## 2013-04-30 NOTE — Patient Instructions (Signed)
Anal Pruritus Anal pruritus is an itching of the anus, which is often due to increased moisture of the skin around the anus. Moisture may be due to sweating or a small amount of remaining stool. The itching and scratching can cause further skin damage.  CAUSES   Poor hygiene.  Excessive moisture from sweating or residual stool in the anal area.  Perfumed soaps and sprays and colored toilet paper.  Chemicals in the foods you eat.  Dietary factors such as caffeine, beer, milk products, chocolate, nuts, citrus fruits, tomatoes, spicy seasonings, jalapeno peppers, and salsa.  Hemorrhoids, infections, and other anal diseases.  Excessive washing.  Overuse of laxatives.  Skin disorders (psoriasis, eczema, or seborrhea). HOME CARE INSTRUCTIONS   Practice good hygiene.  Clean the anal area gently with wet toilet paper, baby wipes, or a wet washcloth after every bowel movement and at bedtime. Avoid using soaps on the anal area. Dry the area thoroughly. Pat the area dry with toilet paper or a towel.  Do not scrub the anal area with anything, even toilet paper.  Try not to scratch the itchy area. Scratching produces more damage, which makes the itching worse.  Take sitz baths in warm water for 15 to 20 minutes, 2 to 3 times a day. Pat the area dry with a soft cloth after each bath.  Zinc oxide ointment or a moisture barrier cream can be applied several times daily to protect the skin.  Only take medicines as directed by your caregiver.  Talk to your caregiver about fiber supplements. These are helpful in normalizing the stool if you have frequent loose stools.  Wear cotton underwear and loose clothing.  Do not use irritants such as bubble baths, scented toilet paper, or genital deodorants. SEEK MEDICAL CARE IF:   Itching does not improve in several days or gets worse.  You have a fever.  There are problems with increased pain, swelling, or redness. MAKE SURE YOU:   Understand  these instructions.  Will watch your condition.  Will get help right away if you are not doing well or get worse. Document Released: 12/05/2010 Document Revised: 08/28/2011 Document Reviewed: 12/05/2010 [employer] Patient Information 2014 [employer].  

## 2013-04-30 NOTE — Progress Notes (Signed)
Shawn Bell 72 y.o.  Body mass index is 29.01 kg/(m^2).  Patient Active Problem List   Diagnosis Date Noted  . Postop Hyponatremia 03/07/2012  . OA (osteoarthritis) of hip 03/06/2012  . Cholelithiasis with cholecystitis 01/30/2012  . Prostate cancer 01/02/2012  . Lipid disorder 01/02/2012  . Heme positive stool 01/02/2012  . DIVERTICULOSIS-COLON 10/23/2008  . ANAL FISSURE 10/23/2008    No Known Allergies  Past Surgical History  Procedure Laterality Date  . Total hip arthroplasty      right  . Shoulder arthroscopy    . Joint replacement    . Cholecystectomy  02/07/2012    Procedure: LAPAROSCOPIC CHOLECYSTECTOMY WITH INTRAOPERATIVE CHOLANGIOGRAM;  Surgeon: Velora Heckler, MD;  Location: [facility];  Service: General;  Laterality: N/A;  . Total hip arthroplasty  03/06/2012    Procedure: TOTAL HIP ARTHROPLASTY;  Surgeon: Loanne Drilling, MD;  Location: [facility];  Service: Orthopedics;  Laterality: Left;   GUEST, [name], MD No diagnosis found.  Doing much better after he stopped tomatoes and his diet. On anal exam the irritation and whiteness is gone premature way. There no worrisome skin areas noted. On rectal exam I feel no masses. He reports that he had a thrombosed hemorrhoid the ruptured bled and stopped. I went ahead and inserted the lighted anoscope and I did not see any residual hemorrhoid and I did not see or feel any masses. I reassured him and encourage him to have his colonoscopies every 5 years. He had a colonoscopy last year. I told him I would see him again on an as needed basis. Return when necessary [name] Daphine Deutscher, MD, [facility] [phone] beeper [phone]  04/30/2013 1:23 PM

## 2013-05-25 ENCOUNTER — Encounter: Payer: Self-pay | Admitting: Internal Medicine

## 2013-05-25 ENCOUNTER — Ambulatory Visit (INDEPENDENT_AMBULATORY_CARE_PROVIDER_SITE_OTHER): Payer: BC Managed Care – PPO | Admitting: Internal Medicine

## 2013-05-25 VITALS — BP 130/74 | HR 70 | Temp 98.1°F | Resp 16 | Ht 71.25 in | Wt 223.2 lb

## 2013-05-25 DIAGNOSIS — R300 Dysuria: Secondary | ICD-10-CM

## 2013-05-25 DIAGNOSIS — R109 Unspecified abdominal pain: Secondary | ICD-10-CM

## 2013-05-25 DIAGNOSIS — R102 Pelvic and perineal pain: Secondary | ICD-10-CM

## 2013-05-25 LAB — POCT UA - MICROSCOPIC ONLY
Casts, Ur, LPF, POC: NEGATIVE
Crystals, Ur, HPF, POC: NEGATIVE
RBC, urine, microscopic: NEGATIVE

## 2013-05-25 LAB — POCT URINALYSIS DIPSTICK
Leukocytes, UA: NEGATIVE
Protein, UA: NEGATIVE
Urobilinogen, UA: 0.2

## 2013-05-25 LAB — COMPREHENSIVE METABOLIC PANEL
AST: 21 U/L (ref 0–37)
Albumin: 4.2 g/dL (ref 3.5–5.2)
BUN: 17 mg/dL (ref 6–23)
Calcium: 9 mg/dL (ref 8.4–10.5)
Chloride: 103 mEq/L (ref 96–112)
Glucose, Bld: 97 mg/dL (ref 70–99)
Potassium: 3.9 mEq/L (ref 3.5–5.3)

## 2013-05-25 LAB — POCT CBC
HCT, POC: 47.5 % (ref 43.5–53.7)
Hemoglobin: 14.9 g/dL (ref 14.1–18.1)
MCH, POC: 32.2 pg — AB (ref 27–31.2)
MCV: 102.5 fL — AB (ref 80–97)
RBC: 4.63 M/uL — AB (ref 4.69–6.13)
WBC: 4.3 10*3/uL — AB (ref 4.6–10.2)

## 2013-05-25 NOTE — Patient Instructions (Signed)
Kidney Stones Kidney stones (urolithiasis) are deposits that form inside your kidneys. The intense pain is caused by the stone moving through the urinary tract. When the stone moves, the ureter goes into spasm around the stone. The stone is usually passed in the urine.  CAUSES   A disorder that makes certain neck glands produce too much parathyroid hormone (primary hyperparathyroidism).  A buildup of uric acid crystals, similar to gout in your joints.  Narrowing (stricture) of the ureter.  A kidney obstruction present at birth (congenital obstruction).  Previous surgery on the kidney or ureters.  Numerous kidney infections. SYMPTOMS   Feeling sick to your stomach (nauseous).  Throwing up (vomiting).  Blood in the urine (hematuria).  Pain that usually spreads (radiates) to the groin.  Frequency or urgency of urination. DIAGNOSIS   Taking a history and physical exam.  Blood or urine tests.  CT scan.  Occasionally, an examination of the inside of the urinary bladder (cystoscopy) is performed. TREATMENT   Observation.  Increasing your fluid intake.  Extracorporeal shock wave lithotripsy This is a noninvasive procedure that uses shock waves to break up kidney stones.  Surgery may be needed if you have severe pain or persistent obstruction. There are various surgical procedures. Most of the procedures are performed with the use of small instruments. Only small incisions are needed to accommodate these instruments, so recovery time is minimized. The size, location, and chemical composition are all important variables that will determine the proper choice of action for you. Talk to your health care provider to better understand your situation so that you will minimize the risk of injury to yourself and your kidney.  HOME CARE INSTRUCTIONS   Drink enough water and fluids to keep your urine clear or pale yellow. This will help you to pass the stone or stone fragments.  Strain  all urine through the provided strainer. Keep all particulate matter and stones for your health care provider to see. The stone causing the pain may be as small as a grain of salt. It is very important to use the strainer each and every time you pass your urine. The collection of your stone will allow your health care provider to analyze it and verify that a stone has actually passed. The stone analysis will often identify what you can do to reduce the incidence of recurrences.  Only take over-the-counter or prescription medicines for pain, discomfort, or fever as directed by your health care provider.  Make a follow-up appointment with your health care provider as directed.  Get follow-up X-rays if required. The absence of pain does not always mean that the stone has passed. It may have only stopped moving. If the urine remains completely obstructed, it can cause loss of kidney function or even complete destruction of the kidney. It is your responsibility to make sure X-rays and follow-ups are completed. Ultrasounds of the kidney can show blockages and the status of the kidney. Ultrasounds are not associated with any radiation and can be performed easily in a matter of minutes. SEEK MEDICAL CARE IF:  You experience pain that is progressive and unresponsive to any pain medicine you have been prescribed. SEEK IMMEDIATE MEDICAL CARE IF:   Pain cannot be controlled with the prescribed medicine.  You have a fever or shaking chills.  The severity or intensity of pain increases over 18 hours and is not relieved by pain medicine.  You develop a new onset of abdominal pain.  You feel faint or pass   out.  You are unable to urinate. MAKE SURE YOU:   Understand these instructions.  Will watch your condition.  Will get help right away if you are not doing well or get worse. Document Released: 06/05/2005 Document Revised: 02/05/2013 Document Reviewed: 11/06/2012 [facility] Patient Information 2014  [place], [place]. Cystoscopy Cystoscopy is a procedure that is used to help your caregiver diagnose and sometimes treat conditions that affect your lower urinary tract. Your lower urinary tract includes your bladder and the tube through which urine passes from your bladder out of your body (urethra). Cystoscopy is performed with a thin, tube-shaped instrument (cystoscope). The cystoscope has lenses and a light at the end so that your caregiver can see inside your bladder. The cystoscope is inserted at the entrance of your urethra. Your caregiver guides it through your urethra and into your bladder. There are two main types of cystoscopy:  Flexible cystoscopy (with a flexible cystoscope).  Rigid cystoscopy (with a rigid cystoscope). Cystoscopy may be recommended for many conditions, including:  Urinary tract infections.  Blood in your urine (hematuria).  Loss of bladder control (urinary incontinence) or overactive bladder.  Unusual cells found in a urine sample.  Urinary blockage.  Painful urination. Cystoscopy may also be done to remove a sample of your tissue to be checked under a microscope (biopsy). It may also be done to remove or destroy bladder stones. LET YOUR CAREGIVER KNOW ABOUT:  Allergies to food or medicine.  Medicines taken, including vitamins, herbs, eyedrops, over-the-counter medicines, and creams.  Use of steroids (by mouth or creams).  Previous problems with anesthetics or numbing medicines.  History of bleeding problems or blood clots.  Previous surgery.  Other health problems, including diabetes and kidney problems.  Possibility of pregnancy, if this applies. PROCEDURE The area around the opening to your urethra will be cleaned. A medicine to numb your urethra (local anesthetic) is used. If a tissue sample or stone is removed during the procedure, you may be given a medicine to make you sleep (general anesthetic). Your caregiver will gently insert the tip of  the cystoscope into your urethra. The cystoscope will be slowly glided through your urethra and into your bladder. Sterile fluid will flow through the cystoscope and into your bladder. The fluid will expand and stretch your bladder. This gives your caregiver a better view of your bladder walls. The procedure lasts about 15 20 minutes. AFTER THE PROCEDURE If a local anesthetic is used, you will be allowed to go home as soon as you are ready. If a general anesthetic is used, you will be taken to a recovery area until you are stable. You may have temporary bleeding and burning on urination. Document Released: 06/02/2000 Document Revised: 02/28/2012 Document Reviewed: 11/27/2011 [facility] Patient Information 2014 [place], [place].

## 2013-05-25 NOTE — Progress Notes (Signed)
Subjective:    Patient ID: Shawn Bell, male    DOB: 04/08/1941, 72 y.o.   MRN: [id number]  HPI 72 yr old is here today with complaints of lower abdominal pain - below navel for one week. He states when he urinates, he experiences some pain below his navel. Pain getting svere at times. Hx prostate cancer, had normal prostate exam and blood test with general surgeon recently. Normal colonoscopy last year.  Last PSA done thru his company and [employer] .2 2 months ago.  No blood seen Post voiding pain severe last night but not today   Review of Systems Prostate cancer, high cholesterol    Objective:   Physical Exam  Constitutional: He is oriented to person, place, and time. He appears well-developed and well-nourished. No distress.  HENT:  Head: Normocephalic.  Eyes: EOM are normal. No scleral icterus.  Neck: Normal range of motion.  Cardiovascular: Normal rate and normal heart sounds.   Pulmonary/Chest: Effort normal and breath sounds normal.  Abdominal: Soft. Normal appearance and bowel sounds are normal. There is tenderness in the suprapubic area. There is no rebound and no CVA tenderness. No hernia. Hernia confirmed negative in the ventral area, confirmed negative in the right inguinal area and confirmed negative in the left inguinal area.    Genitourinary: Rectum normal, prostate normal and penis normal.  Musculoskeletal: Normal range of motion.  Neurological: He is alert and oriented to person, place, and time. No cranial nerve deficit. He exhibits normal muscle tone. Coordination normal.  Psychiatric: He has a normal mood and affect. His behavior is normal.   Results for orders placed in visit on 05/25/13  POCT UA - MICROSCOPIC ONLY      Result Value Range   WBC, Ur, HPF, POC Neg     RBC, urine, microscopic neg     Bacteria, U Microscopic neg     Mucus, UA neg     Epithelial cells, urine per micros 0-2     Crystals, Ur, HPF, POC neg     Casts, Ur, LPF, POC neg     Yeast, UA neg    POCT URINALYSIS DIPSTICK      Result Value Range   Color, UA yellow     Clarity, UA clear     Glucose, UA neg     Bilirubin, UA neg     Ketones, UA neg     Spec Grav, UA 1.025     Blood, UA neg     pH, UA 6.0     Protein, UA neg     Urobilinogen, UA 0.2     Nitrite, UA neg     Leukocytes, UA Negative    POCT CBC      Result Value Range   WBC 4.3 (*) 4.6 - 10.2 K/uL   Lymph, poc 1.1  0.6 - 3.4   POC LYMPH PERCENT 26.6  10 - 50 %L   MID (cbc) 0.4  0 - 0.9   POC MID % 9.9  0 - 12 %M   POC Granulocyte 2.7  2 - 6.9   Granulocyte percent 63.5  37 - 80 %G   RBC 4.63 (*) 4.69 - 6.13 M/uL   Hemoglobin 14.9  14.1 - 18.1 g/dL   HCT, POC 78.2  95.6 - 53.7 %   MCV 102.5 (*) 80 - 97 fL   MCH, POC 32.2 (*) 27 - 31.2 pg   MCHC 31.4 (*) 31.8 - 35.4 g/dL  RDW, POC 13.4     Platelet Count, POC 220  142 - 424 K/uL   MPV 8.0  0 - 99.8 fL  POCT SEDIMENTATION RATE      Result Value Range   POCT SED RATE    0 - 22 mm/hr  IFOBT (OCCULT BLOOD)      Result Value Range   IFOBT Positive     Cs urine       Assessment & Plan:  Hemosure positive/Had anoscopy by surgeon this month Pelvic pain/Assoc with bladder emptying Hx prostate cancer Schedule CT abdom/pelvis See urology next week Dr. Earlene Plater

## 2013-05-25 NOTE — Progress Notes (Signed)
   Subjective:    Patient ID: Shawn Bell, male    DOB: Jan 21, 1941, 72 y.o.   MRN: [id number]  HPI    Review of Systems     Objective:   Physical Exam        Assessment & Plan:

## 2013-05-26 ENCOUNTER — Encounter: Payer: Self-pay | Admitting: Radiology

## 2013-05-27 LAB — URINE CULTURE
Colony Count: NO GROWTH
Organism ID, Bacteria: NO GROWTH

## 2013-05-28 ENCOUNTER — Ambulatory Visit
Admission: RE | Admit: 2013-05-28 | Discharge: 2013-05-28 | Disposition: A | Discharge status: Home / Self Care | Payer: BC Managed Care – PPO | Source: Ambulatory Visit | Attending: Internal Medicine | Admitting: Internal Medicine

## 2013-05-28 DIAGNOSIS — R102 Pelvic and perineal pain: Secondary | ICD-10-CM

## 2013-05-28 DIAGNOSIS — R109 Unspecified abdominal pain: Secondary | ICD-10-CM

## 2013-05-28 MED ORDER — IOHEXOL 300 MG/ML  SOLN: 100.0000 mL | Freq: Once | INTRAMUSCULAR | Status: DC | PRN

## 2013-05-28 MED ORDER — IOHEXOL 300 MG/ML  SOLN
125.0000 mL | Freq: Once | INTRAMUSCULAR | Status: AC | PRN
  Administered 2013-05-28: 125 mL via INTRAVENOUS

## 2013-06-18 ENCOUNTER — Telehealth: Payer: Self-pay

## 2013-06-18 NOTE — Telephone Encounter (Signed)
Faxed with confirmation.

## 2013-06-18 NOTE — Telephone Encounter (Signed)
Patient is needing [place] to fax over his pneumonia immunization to Baltazar Najjar at the [facility] at fax#: [phone]

## 2013-11-04 ENCOUNTER — Other Ambulatory Visit: Payer: Self-pay | Admitting: Otolaryngology

## 2013-11-04 DIAGNOSIS — G519 Disorder of facial nerve, unspecified: Secondary | ICD-10-CM

## 2013-11-12 ENCOUNTER — Ambulatory Visit
Admission: RE | Admit: 2013-11-12 | Discharge: 2013-11-12 | Disposition: A | Discharge status: Home / Self Care | Payer: BC Managed Care – PPO | Source: Ambulatory Visit | Attending: Otolaryngology | Admitting: Otolaryngology

## 2013-11-12 DIAGNOSIS — G519 Disorder of facial nerve, unspecified: Secondary | ICD-10-CM

## 2013-11-25 ENCOUNTER — Other Ambulatory Visit: Payer: Self-pay | Admitting: Otolaryngology

## 2015-07-03 ENCOUNTER — Inpatient Hospital Stay (HOSPITAL_COMMUNITY)
Admission: EM | Admit: 2015-07-03 | Discharge: 2015-07-04 | DRG: 123 | Disposition: A | Discharge status: Home / Self Care | Payer: BLUE CROSS/BLUE SHIELD | Attending: Internal Medicine | Admitting: Internal Medicine

## 2015-07-03 ENCOUNTER — Emergency Department (HOSPITAL_COMMUNITY): Payer: BLUE CROSS/BLUE SHIELD

## 2015-07-03 ENCOUNTER — Encounter (HOSPITAL_COMMUNITY): Payer: Self-pay

## 2015-07-03 DIAGNOSIS — Z86010 Personal history of colonic polyps: Secondary | ICD-10-CM | POA: Diagnosis not present

## 2015-07-03 DIAGNOSIS — Z8249 Family history of ischemic heart disease and other diseases of the circulatory system: Secondary | ICD-10-CM | POA: Diagnosis not present

## 2015-07-03 DIAGNOSIS — H532 Diplopia: Principal | ICD-10-CM | POA: Diagnosis present

## 2015-07-03 DIAGNOSIS — Z923 Personal history of irradiation: Secondary | ICD-10-CM | POA: Diagnosis not present

## 2015-07-03 DIAGNOSIS — E785 Hyperlipidemia, unspecified: Secondary | ICD-10-CM | POA: Diagnosis not present

## 2015-07-03 DIAGNOSIS — Z8546 Personal history of malignant neoplasm of prostate: Secondary | ICD-10-CM | POA: Diagnosis not present

## 2015-07-03 DIAGNOSIS — Z7982 Long term (current) use of aspirin: Secondary | ICD-10-CM | POA: Diagnosis not present

## 2015-07-03 DIAGNOSIS — Z79899 Other long term (current) drug therapy: Secondary | ICD-10-CM | POA: Diagnosis not present

## 2015-07-03 DIAGNOSIS — R270 Ataxia, unspecified: Secondary | ICD-10-CM | POA: Diagnosis not present

## 2015-07-03 DIAGNOSIS — R531 Weakness: Secondary | ICD-10-CM | POA: Diagnosis not present

## 2015-07-03 DIAGNOSIS — Z96642 Presence of left artificial hip joint: Secondary | ICD-10-CM | POA: Diagnosis present

## 2015-07-03 DIAGNOSIS — G459 Transient cerebral ischemic attack, unspecified: Secondary | ICD-10-CM

## 2015-07-03 LAB — URINALYSIS, ROUTINE W REFLEX MICROSCOPIC
BILIRUBIN URINE: NEGATIVE
Glucose, UA: NEGATIVE mg/dL
HGB URINE DIPSTICK: NEGATIVE
Ketones, ur: NEGATIVE mg/dL
Leukocytes, UA: NEGATIVE
Nitrite: NEGATIVE
PH: 6 (ref 5.0–8.0)
Protein, ur: NEGATIVE mg/dL
SPECIFIC GRAVITY, URINE: 1.024 (ref 1.005–1.030)

## 2015-07-03 LAB — I-STAT TROPONIN, ED: Troponin i, poc: 0 ng/mL (ref 0.00–0.08)

## 2015-07-03 LAB — CBC WITH DIFFERENTIAL/PLATELET
BASOS ABS: 0 10*3/uL (ref 0.0–0.1)
BASOS PCT: 1 %
EOS PCT: 6 %
Eosinophils Absolute: 0.3 10*3/uL (ref 0.0–0.7)
HCT: 44.3 % (ref 39.0–52.0)
Hemoglobin: 15.1 g/dL (ref 13.0–17.0)
LYMPHS PCT: 26 %
Lymphs Abs: 1.4 10*3/uL (ref 0.7–4.0)
MCH: 32.5 pg (ref 26.0–34.0)
MCHC: 34.1 g/dL (ref 30.0–36.0)
MCV: 95.3 fL (ref 78.0–100.0)
MONO ABS: 0.5 10*3/uL (ref 0.1–1.0)
MONOS PCT: 10 %
Neutro Abs: 3.1 10*3/uL (ref 1.7–7.7)
Neutrophils Relative %: 57 %
PLATELETS: 198 10*3/uL (ref 150–400)
RBC: 4.65 MIL/uL (ref 4.22–5.81)
RDW: 12.5 % (ref 11.5–15.5)
WBC: 5.4 10*3/uL (ref 4.0–10.5)

## 2015-07-03 LAB — BASIC METABOLIC PANEL
ANION GAP: 8 (ref 5–15)
BUN: 17 mg/dL (ref 6–20)
CALCIUM: 8.8 mg/dL — AB (ref 8.9–10.3)
CO2: 25 mmol/L (ref 22–32)
CREATININE: 1.01 mg/dL (ref 0.61–1.24)
Chloride: 107 mmol/L (ref 101–111)
GFR calc Af Amer: >60 mL/min (ref >60)
GLUCOSE: 106 mg/dL — AB (ref 65–99)
Potassium: 3.7 mmol/L (ref 3.5–5.1)
Sodium: 140 mmol/L (ref 135–145)

## 2015-07-03 LAB — APTT: aPTT: 36 seconds (ref 24–37)

## 2015-07-03 LAB — PROTIME-INR
INR: 0.98 (ref 0.00–1.49)
Prothrombin Time: 13.2 seconds (ref 11.6–15.2)

## 2015-07-03 MED ORDER — PAPAYA/PINEAPPLE ENZYMES PO CHEW: 1.0000 | CHEWABLE_TABLET | Freq: Every day | ORAL | Status: DC

## 2015-07-03 MED ORDER — ADULT MULTIVITAMIN W/MINERALS CH
1.0000 | ORAL_TABLET | Freq: Every day | ORAL | Status: DC
  Filled 2015-07-03: qty 1

## 2015-07-03 MED ORDER — ASPIRIN 325 MG PO TABS
325.0000 mg | ORAL_TABLET | Freq: Every day | ORAL | Status: DC
  Administered 2015-07-03: 325 mg via ORAL
  Filled 2015-07-03 (×2): qty 1

## 2015-07-03 MED ORDER — ASPIRIN 300 MG RE SUPP
300.0000 mg | Freq: Every day | RECTAL | Status: DC
  Filled 2015-07-03: qty 1

## 2015-07-03 MED ORDER — CO Q-10 50 MG PO CAPS: 1.0000 | ORAL_CAPSULE | Freq: Every day | ORAL | Status: DC

## 2015-07-03 MED ORDER — SODIUM CHLORIDE 0.9 % IV SOLN
INTRAVENOUS | Status: DC
  Administered 2015-07-03: 75 mL/h via INTRAVENOUS

## 2015-07-03 MED ORDER — ROSUVASTATIN CALCIUM 5 MG PO TABS
5.0000 mg | ORAL_TABLET | Freq: Every day | ORAL | Status: DC
  Filled 2015-07-03: qty 1

## 2015-07-03 MED ORDER — STROKE: EARLY STAGES OF RECOVERY BOOK
Freq: Once | Status: AC
  Administered 2015-07-03: 22:00:00
  Filled 2015-07-03: qty 1

## 2015-07-03 MED ORDER — SENNOSIDES-DOCUSATE SODIUM 8.6-50 MG PO TABS: 1.0000 | ORAL_TABLET | Freq: Every evening | ORAL | Status: DC | PRN

## 2015-07-03 MED ORDER — HEPARIN SODIUM (PORCINE) 5000 UNIT/ML IJ SOLN
5000.0000 [IU] | Freq: Three times a day (TID) | INTRAMUSCULAR | Status: DC
  Administered 2015-07-03 – 2015-07-04 (×2): 5000 [IU] via SUBCUTANEOUS
  Filled 2015-07-03 (×5): qty 1

## 2015-07-03 NOTE — ED Notes (Signed)
MD at bedside. 

## 2015-07-03 NOTE — H&P (Addendum)
[facility] History and Physical  Shawn Bell [id number] DOB: Jul 31, 1940 DOA: 07/03/2015  Referring physician: ED physician PCP: Kennon Portela, MD  Specialists:   Chief Complaint: Double vision and weakness  HPI: Shawn Bell is a 75 y.o. male with PMH of hyperlipidemia, history of prostate cancer (S/P of radiation therapy), diverticulosis, hemorrhoids, who presents with double vision and weakness.  Per family, pt suddenly developed weakness and could not be able to get up to take the dog out when he was sitting in chair watching TV. Not sure whether patient had unilateral weakness. Patient also had double vision. Patient denies headache, lightheadedness, dizziness, facial asymmetry, slurred speech, chest pain, shortness of breath, abdominal pain, N/V/D/C, numbness. His symptoms have resolved when I saw patient in ED. He does not have fever, chills, symptoms of UTI or any other complaints currently.  In ED, patient was found to have negative CT head for acute intracranial abnormalities, troponin negative, WBC 5.4, temperature normal, no tachycardia, electrolytes and renal function okay. The patient's and admitted to inpatient for further interventional treatment. Neurology was consulted.  EKG: Independently reviewed. QTC 430, Q-wave only in lead 3, incomplete right bundle blockage  Where does patient live?   At home    Can patient participate in ADLs?  Some   Review of Systems:   General: no fevers, chills, no changes in body weight, has fatigue HEENT: has double vision, no hearing changes or sore throat Pulm: no dyspnea, coughing, wheezing CV: no chest pain, palpitations Abd: no nausea, vomiting, abdominal pain, diarrhea, constipation GU: no dysuria, burning on urination, increased urinary frequency, hematuria  Ext: no leg edema Neuro: no unilateral weakness, numbness, or tingling, or hearing loss Skin: no rash MSK: No muscle spasm, no deformity, no limitation of  range of movement in spin Heme: No easy bruising.  Travel history: No recent long distant travel.  Allergy: No Known Allergies  Past Medical History  Diagnosis Date  . Diverticulosis of colon (without mention of hemorrhage)   . Hemorrhoids   . Personal history of colonic polyps 01/23/2003  . Hyperlipemia   . History of prostate cancer     S/P RADIATION [place]  . Cancer [facility])     Past Surgical History  Procedure Laterality Date  . Total hip arthroplasty      right  . Shoulder arthroscopy    . Joint replacement    . Cholecystectomy  02/07/2012    Procedure: LAPAROSCOPIC CHOLECYSTECTOMY WITH INTRAOPERATIVE CHOLANGIOGRAM;  Surgeon: Earnstine Regal, MD;  Location: [facility];  Service: General;  Laterality: N/A;  . Total hip arthroplasty  03/06/2012    Procedure: TOTAL HIP ARTHROPLASTY;  Surgeon: Gearlean Alf, MD;  Location: [facility];  Service: Orthopedics;  Laterality: Left;    Social History:  reports that he has never smoked. He has never used smokeless tobacco. He reports that he drinks alcohol. He reports that he does not use illicit drugs.  Family History:  Family History  Problem Relation Age of Onset  . Colon cancer Neg Hx   . Colon polyps Neg Hx   . Rectal cancer Neg Hx   . Stomach cancer Neg Hx   . Heart failure Mother      Prior to Admission medications   Medication Sig Start Date End Date Taking? Authorizing Provider  aspirin 81 MG tablet Take 81 mg by mouth daily.   Yes Historical Provider, MD  Coenzyme Q10 (CO Q-10) 50 MG CAPS Take by mouth.  Yes Historical Provider, MD  Digestive Enzymes (PAPAYA/PINEAPPLE ENZYMES PO) Take by mouth. Reported on 07/03/2015   Yes Historical Provider, MD  Multiple Vitamins-Minerals (CENTRUM SILVER ADULT 50+ PO) Take by mouth.   Yes Historical Provider, MD  rosuvastatin (CRESTOR) 5 MG tablet Take 1 tablet (5 mg total) by mouth daily. Needs office visit and labs-2nd notice 12/23/12  Yes Rise Mu, PA-C    Physical Exam: Filed Vitals:    07/03/15 2015 07/03/15 2045 07/03/15 2100 07/03/15 2115  BP: 132/81 127/77 128/79 124/88  Pulse: 63 60 60   Temp:      TempSrc:      Resp: 20 16 22 21   SpO2: 95% 96% 99%    General: Not in acute distress HEENT:       Eyes: PERRL, EOMI, no scleral icterus.       ENT: No discharge from the ears and nose, no pharynx injection, no tonsillar enlargement.        Neck: No JVD, no bruit, no mass felt. Heme: No neck lymph node enlargement. Cardiac: S1/S2, RRR, No murmurs, No gallops or rubs. Pulm:  No rales, wheezing, rhonchi or rubs. Abd: Soft, nondistended, nontender, no rebound pain, no organomegaly, BS present. Ext: No pitting leg edema bilaterally. 2+DP/PT pulse bilaterally. Musculoskeletal: No joint deformities, No joint redness or warmth, no limitation of ROM in spin. Skin: No rashes.  Neuro: Alert, oriented X3, cranial nerves II-XII grossly intact, muscle strength 5/5 in all extremities, sensation to light touch intact. Knee reflex 1+ bilaterally. Negative Babinski's sign. Normal finger to nose test. Psych: Patient is not psychotic, no suicidal or hemocidal ideation.  Labs on Admission:  Basic Metabolic Panel:  Recent Labs Lab 07/03/15 1950  NA 140  K 3.7  CL 107  CO2 25  GLUCOSE 106*  BUN 17  CREATININE 1.01  CALCIUM 8.8*   Liver Function Tests: No results for input(s): AST, ALT, ALKPHOS, BILITOT, PROT, ALBUMIN in the last 168 hours. No results for input(s): LIPASE, AMYLASE in the last 168 hours. No results for input(s): AMMONIA in the last 168 hours. CBC:  Recent Labs Lab 07/03/15 1950  WBC 5.4  NEUTROABS 3.1  HGB 15.1  HCT 44.3  MCV 95.3  PLT 198   Cardiac Enzymes: No results for input(s): CKTOTAL, CKMB, CKMBINDEX, TROPONINI in the last 168 hours.  BNP (last 3 results) No results for input(s): BNP in the last 8760 hours.  ProBNP (last 3 results) No results for input(s): PROBNP in the last 8760 hours.  CBG: No results for input(s): GLUCAP in the last  168 hours.  Radiological Exams on Admission: Ct Head Wo Contrast  07/03/2015  CLINICAL DATA:  Acute onset of generalized weakness. Slight slurring of speech. Initial encounter. EXAM: CT HEAD WITHOUT CONTRAST TECHNIQUE: Contiguous axial images were obtained from the base of the skull through the vertex without intravenous contrast. COMPARISON:  MRI of the brain performed 11/12/2013 FINDINGS: There is no evidence of acute infarction, mass lesion, or intra- or extra-axial hemorrhage on CT. Mild cerebellar atrophy is noted. Mild periventricular white matter change likely reflects small vessel ischemic microangiopathy. The brainstem and fourth ventricle are within normal limits. The basal ganglia are unremarkable in appearance. The cerebral hemispheres demonstrate grossly normal gray-white differentiation. No mass effect or midline shift is seen. There is no evidence of fracture; visualized osseous structures are unremarkable in appearance. The visualized portions of the orbits are within normal limits. The patient is status post bilateral maxillary antrectomy and resection of  most of the ethmoid air cells. The remaining ethmoid air cells are opacified, with partial opacification of the frontal sinuses. The remaining paranasal sinuses and mastoid air cells are well-aerated. No significant soft tissue abnormalities are seen. IMPRESSION: 1. No acute intracranial pathology seen on CT. 2. Mild small vessel ischemic microangiopathy. 3. Partial opacification of the frontal sinuses. Electronically Signed   By: Garald Balding M.D.   On: 07/03/2015 19:37    Assessment/Plan Principal Problem:   Weakness Active Problems:   Hyperlipemia   TIA (transient ischemic attack)   Double vision   Weakness and double vision: Etiology is not clear, but is concerning for TIA. His risk factors include old age and hyperlipidemia. His symptoms have resolved. Currently hemodynamically stable.  - will admit to tele bed - Follow-up  neurology recommendations - Risk factor modification: HgbA1c, fasting lipid panel  - MRI, MRA of the brain without contrast  - PT consult, OT consult, Speech consult  - 2 d Echocardiogram  - Ekg  - Carotid dopplers  - Aspirin  HLD: Last LDL was 77 on 01/02/12  -Continue home medications: Crestor -Follow-up FLP  DVT ppx: SQ Heparin Code Status: Full code Family Communication: Yes, patient's wife and grandson at bed side Disposition Plan: Admit to inpatient   Date of Service 07/03/2015    Ivor Costa [facility] Pager [phone]  If 7PM-7AM, please contact night-coverage [email or web address] Password [facility] 07/03/2015, 9:39 PM

## 2015-07-03 NOTE — ED Provider Notes (Signed)
CSN: [id number]     Arrival date & time 07/03/15  1850 History   First MD Initiated Contact with Patient 07/03/15 1903     Chief Complaint  Patient presents with  . Fatigue    HPI   Shawn Bell is a 75 y.o. male with a PMH of HLD, prostate cancer, diverticulosis who presents to the ED with fatigue and generalized weakness, which started 1 hour prior to arrival. His family is present at bedside, and states he was watching TV and would not get up to take the dog out and seemed disoriented, which they state is unusual for him. He also reports his vision "went double." He denies complaints at this time. He denies headache, lightheadedness, dizziness, facial asymmetry, slurred speech, chest pain, shortness of breath, abdominal pain, N/V/D/C, numbness, weakness, paresthesia. His family states he has never experienced these symptoms before.   Past Medical History  Diagnosis Date  . Diverticulosis of colon (without mention of hemorrhage)   . Hemorrhoids   . Personal history of colonic polyps 01/23/2003  . Hyperlipemia   . History of prostate cancer     S/P RADIATION [place]  . Cancer [facility])    Past Surgical History  Procedure Laterality Date  . Total hip arthroplasty      right  . Shoulder arthroscopy    . Joint replacement    . Cholecystectomy  02/07/2012    Procedure: LAPAROSCOPIC CHOLECYSTECTOMY WITH INTRAOPERATIVE CHOLANGIOGRAM;  Surgeon: Earnstine Regal, MD;  Location: [facility];  Service: General;  Laterality: N/A;  . Total hip arthroplasty  03/06/2012    Procedure: TOTAL HIP ARTHROPLASTY;  Surgeon: Gearlean Alf, MD;  Location: [facility];  Service: Orthopedics;  Laterality: Left;   Family History  Problem Relation Age of Onset  . Colon cancer Neg Hx   . Colon polyps Neg Hx   . Rectal cancer Neg Hx   . Stomach cancer Neg Hx   . Heart failure Mother    Social History  Substance Use Topics  . Smoking status: Never Smoker   . Smokeless tobacco: Never Used  . Alcohol Use: Yes     Comment:  wine daily -red     Review of Systems  Constitutional: Negative for fever and chills.  Eyes: Positive for visual disturbance.  Respiratory: Negative for shortness of breath.   Cardiovascular: Negative for chest pain.  Gastrointestinal: Negative for nausea, vomiting, abdominal pain, diarrhea and constipation.  Neurological: Negative for dizziness, syncope, facial asymmetry, speech difficulty, weakness, light-headedness, numbness and headaches.  Psychiatric/Behavioral: Negative for confusion.  All other systems reviewed and are negative.     Allergies  Review of patient's allergies indicates no known allergies.  Home Medications   Prior to Admission medications   Medication Sig Start Date End Date Taking? Authorizing Provider  aspirin 81 MG tablet Take 81 mg by mouth daily.   Yes Historical Provider, MD  Coenzyme Q10 (CO Q-10) 50 MG CAPS Take by mouth.   Yes Historical Provider, MD  Digestive Enzymes (PAPAYA/PINEAPPLE ENZYMES PO) Take by mouth. Reported on 07/03/2015   Yes Historical Provider, MD  Multiple Vitamins-Minerals (CENTRUM SILVER ADULT 50+ PO) Take by mouth.   Yes Historical Provider, MD  rosuvastatin (CRESTOR) 5 MG tablet Take 1 tablet (5 mg total) by mouth daily. Needs office visit and labs-2nd notice 12/23/12  Yes Ryan M Dunn, PA-C    BP 124/88 mmHg  Pulse 60  Temp(Src) 97.8 F (36.6 C) (Oral)  Resp 21  SpO2 99%  Physical Exam  Constitutional: He is oriented to person, place, and time. He appears well-developed and well-nourished. No distress.  HENT:  Head: Normocephalic and atraumatic.  Right Ear: External ear normal.  Left Ear: External ear normal.  Nose: Nose normal.  Mouth/Throat: Uvula is midline, oropharynx is clear and moist and mucous membranes are normal.  Eyes: Conjunctivae, EOM and lids are normal. Pupils are equal, round, and reactive to light. Right eye exhibits no discharge. Left eye exhibits no discharge. No scleral icterus.  Neck: Normal range of  motion. Neck supple.  Cardiovascular: Normal rate, regular rhythm, normal heart sounds, intact distal pulses and normal pulses.   Pulmonary/Chest: Effort normal and breath sounds normal. No respiratory distress. He has no wheezes. He has no rales.  Abdominal: Soft. Normal appearance and bowel sounds are normal. He exhibits no distension and no mass. There is no tenderness. There is no rigidity, no rebound and no guarding.  Musculoskeletal: Normal range of motion. He exhibits no edema or tenderness.  Neurological: He is alert and oriented to person, place, and time. He has normal strength and normal reflexes. No cranial nerve deficit or sensory deficit. He displays a negative Romberg sign. Coordination normal.  Unsteady gait. No facial asymmetry. Patient slow to respond to questions, however speech is fluent.  Skin: Skin is warm, dry and intact. No rash noted. He is not diaphoretic. No erythema. No pallor.  Psychiatric: He has a normal mood and affect. His speech is normal and behavior is normal.  Nursing note and vitals reviewed.   ED Course  Procedures (including critical care time)  Labs Review Labs Reviewed  BASIC METABOLIC PANEL - Abnormal; Notable for the following:    Glucose, Bld 106 (*)    Calcium 8.8 (*)    All other components within normal limits  CBC WITH DIFFERENTIAL/PLATELET  URINALYSIS, ROUTINE W REFLEX MICROSCOPIC (NOT AT [facility])  HEMOGLOBIN A1C  LIPID PANEL  PROTIME-INR  APTT  I-STAT TROPOININ, ED    Imaging Review Ct Head Wo Contrast  07/03/2015  CLINICAL DATA:  Acute onset of generalized weakness. Slight slurring of speech. Initial encounter. EXAM: CT HEAD WITHOUT CONTRAST TECHNIQUE: Contiguous axial images were obtained from the base of the skull through the vertex without intravenous contrast. COMPARISON:  MRI of the brain performed 11/12/2013 FINDINGS: There is no evidence of acute infarction, mass lesion, or intra- or extra-axial hemorrhage on CT. Mild cerebellar  atrophy is noted. Mild periventricular white matter change likely reflects small vessel ischemic microangiopathy. The brainstem and fourth ventricle are within normal limits. The basal ganglia are unremarkable in appearance. The cerebral hemispheres demonstrate grossly normal gray-white differentiation. No mass effect or midline shift is seen. There is no evidence of fracture; visualized osseous structures are unremarkable in appearance. The visualized portions of the orbits are within normal limits. The patient is status post bilateral maxillary antrectomy and resection of most of the ethmoid air cells. The remaining ethmoid air cells are opacified, with partial opacification of the frontal sinuses. The remaining paranasal sinuses and mastoid air cells are well-aerated. No significant soft tissue abnormalities are seen. IMPRESSION: 1. No acute intracranial pathology seen on CT. 2. Mild small vessel ischemic microangiopathy. 3. Partial opacification of the frontal sinuses. Electronically Signed   By: Garald Balding M.D.   On: 07/03/2015 19:37     I have personally reviewed and evaluated these images and lab results as part of my medical decision-making.   EKG Interpretation   Date/Time:  Saturday July 03 2015 19:53:27 EST Ventricular Rate:  64 PR Interval:  176 QRS Duration: 112 QT Interval:  417 QTC Calculation: 430 R Axis:   31 Text Interpretation:  Sinus rhythm Incomplete right bundle branch block  since last tracing no significant change Confirmed by BELFI  MD, MELANIE  ([id number]) on 07/03/2015 8:18:01 PM      MDM   Final diagnoses:  Weakness  Ataxia    75 year old male presents with sudden onset generalized weakness, which occurred today prior to arrival. Family states he seemed disoriented and would not get up to take the dog out while watching TV. The patient reports he had "double vision," however has no current complaints.  Patient is afebrile. Vital signs stable. On exam,  patient ambulates with mild ataxia. Otherwise, normal neuro exam with no focal deficit.  CBC, BMP unremarkable. UA negative for infection. EKG sinus rhythm, HR 64. Troponin negative. Head CT negative for acute intracranial pathology.  Neurology consulted. Spoke with neurohospitalist, who advised admitting to [facility] for further evaluation and management. Hospitalist consulted. Spoke with Dr. Blaine Hamper ([facility]). Ordered MRI and MRA. Patient to be admitted for further evaluation and management. Patient discussed with and seen by Dr. Tamera Punt.  BP 124/88 mmHg  Pulse 60  Temp(Src) 97.8 F (36.6 C) (Oral)  Resp 21  SpO2 99%     Marella Chimes, PA-C 07/03/15 2212  Malvin Johns, MD 07/03/15 2218

## 2015-07-03 NOTE — ED Notes (Signed)
Nurse drawing labs. 

## 2015-07-03 NOTE — Consult Note (Signed)
Stroke Consult Consulting Physician:   Chief Complaint: weakness, blurred vision  HPI: Shawn Bell is an 75 y.o. male hx of HLD, prostate CA presenting to the ED with acute onset of weakness and blurred/double vision. He reports watching TV when he noted he was unable to get out of the chair. Unsure if weakness was generalized or unilateral. Symptoms lasted around 1 hour, reports he is currently back to his baseline. No prior CVA or TIA history. Takes ASA 81mg  daily at home.   MRI brain imaging reviewed, no acute process noted.   Date last known well: 07/03/2015 Time last known well: 1700  tPA Given: no, symptoms resolved Modified Rankin: Rankin Score=0  Past Medical History  Diagnosis Date  . Diverticulosis of colon (without mention of hemorrhage)   . Hemorrhoids   . Personal history of colonic polyps 01/23/2003  . Hyperlipemia   . History of prostate cancer     S/P RADIATION [place]  . Cancer [facility])     Past Surgical History  Procedure Laterality Date  . Total hip arthroplasty      right  . Shoulder arthroscopy    . Joint replacement    . Cholecystectomy  02/07/2012    Procedure: LAPAROSCOPIC CHOLECYSTECTOMY WITH INTRAOPERATIVE CHOLANGIOGRAM;  Surgeon: Earnstine Regal, MD;  Location: [facility];  Service: General;  Laterality: N/A;  . Total hip arthroplasty  03/06/2012    Procedure: TOTAL HIP ARTHROPLASTY;  Surgeon: Gearlean Alf, MD;  Location: [facility];  Service: Orthopedics;  Laterality: Left;    Family History  Problem Relation Age of Onset  . Colon cancer Neg Hx   . Colon polyps Neg Hx   . Rectal cancer Neg Hx   . Stomach cancer Neg Hx   . Heart failure Mother    Social History:  reports that he has never smoked. He has never used smokeless tobacco. He reports that he drinks alcohol. He reports that he does not use illicit drugs.  Allergies: No Known Allergies   (Not in a hospital admission)  ROS: Out of a complete 14 system review, the patient complains of only the  following symptoms, and all other reviewed systems are negative. +double vision  Physical Examination: Filed Vitals:   07/03/15 2100 07/03/15 2115  BP: 128/79 124/88  Pulse: 60   Temp:    Resp: 22 21   Physical Exam  Constitutional: He appears well-developed and well-nourished.  Psych: Affect appropriate to situation Eyes: No scleral injection HENT: No OP obstrucion Head: Normocephalic.  Cardiovascular: Normal rate and regular rhythm.  Respiratory: Effort normal and breath sounds normal.  GI: Soft. Bowel sounds are normal. No distension. There is no tenderness.  Skin: WDI   Neurologic Examination: Mental Status: Alert, oriented, thought content appropriate.  Speech fluent without evidence of aphasia.  Able to follow 3 step commands without difficulty. Cranial Nerves: II: funduscopic exam wnl bilaterally, visual fields grossly normal, pupils equal, round, reactive to light and accommodation III,IV, VI: ptosis not present, extra-ocular motions intact bilaterally V,VII: smile symmetric, facial light touch sensation normal bilaterally VIII: hearing normal bilaterally IX,X: gag reflex present XI: trapezius strength/neck flexion strength normal bilaterally XII: tongue strength normal  Motor: Right : Upper extremity    Left:     Upper extremity 5/5 deltoid       5/5 deltoid 5/5 biceps      5/5 biceps  5/5 triceps      5/5 triceps 5/5 hand grip      5/5  hand grip  Lower extremity     Lower extremity 5/5 hip flexor      5/5 hip flexor 5/5 quadricep      5/5 quadriceps  5/5 hamstrings     5/5 hamstrings 5/5 plantar flexion       5/5 plantar flexion 5/5 plantar extension     5/5 plantar extension Tone and bulk:normal tone throughout; no atrophy noted Sensory: Pinprick and light touch intact throughout, bilaterally Deep Tendon Reflexes: 2+ and symmetric throughout Plantars: Right: downgoing   Left: downgoing Cerebellar: normal finger-to-nose, and normal heel-to-shin  test Gait: deferred  Laboratory Studies:   Basic Metabolic Panel:  Recent Labs Lab 07/03/15 1950  NA 140  K 3.7  CL 107  CO2 25  GLUCOSE 106*  BUN 17  CREATININE 1.01  CALCIUM 8.8*    Liver Function Tests: No results for input(s): AST, ALT, ALKPHOS, BILITOT, PROT, ALBUMIN in the last 168 hours. No results for input(s): LIPASE, AMYLASE in the last 168 hours. No results for input(s): AMMONIA in the last 168 hours.  CBC:  Recent Labs Lab 07/03/15 1950  WBC 5.4  NEUTROABS 3.1  HGB 15.1  HCT 44.3  MCV 95.3  PLT 198    Cardiac Enzymes: No results for input(s): CKTOTAL, CKMB, CKMBINDEX, TROPONINI in the last 168 hours.  BNP: Invalid input(s): POCBNP  CBG: No results for input(s): GLUCAP in the last 168 hours.  Microbiology: Results for orders placed or performed in visit on 05/25/13  Urine culture     Status: None   Collection Time: 05/25/13  2:21 PM  Result Value Ref Range Status   Colony Count NO GROWTH  Final   Organism ID, Bacteria NO GROWTH  Final    Coagulation Studies:  Recent Labs  07/03/15 1950  LABPROT 13.2  INR 0.98    Urinalysis:  Recent Labs Lab 07/03/15 2041  COLORURINE YELLOW  LABSPEC 1.024  PHURINE 6.0  GLUCOSEU NEGATIVE  HGBUR NEGATIVE  BILIRUBINUR NEGATIVE  KETONESUR NEGATIVE  PROTEINUR NEGATIVE  NITRITE NEGATIVE  LEUKOCYTESUR NEGATIVE    Lipid Panel:     Component Value Date/Time   CHOL 156 01/02/2012 1512   TRIG 232* 01/02/2012 1512   HDL 33* 01/02/2012 1512   CHOLHDL 4.7 01/02/2012 1512   VLDL 46* 01/02/2012 1512   LDLCALC 77 01/02/2012 1512    HgbA1C: No results found for: HGBA1C  Urine Drug Screen:  No results found for: LABOPIA, COCAINSCRNUR, LABBENZ, AMPHETMU, THCU, LABBARB  Alcohol Level: No results for input(s): ETH in the last 168 hours.   Imaging: Ct Head Wo Contrast  07/03/2015  CLINICAL DATA:  Acute onset of generalized weakness. Slight slurring of speech. Initial encounter. EXAM: CT HEAD  WITHOUT CONTRAST TECHNIQUE: Contiguous axial images were obtained from the base of the skull through the vertex without intravenous contrast. COMPARISON:  MRI of the brain performed 11/12/2013 FINDINGS: There is no evidence of acute infarction, mass lesion, or intra- or extra-axial hemorrhage on CT. Mild cerebellar atrophy is noted. Mild periventricular white matter change likely reflects small vessel ischemic microangiopathy. The brainstem and fourth ventricle are within normal limits. The basal ganglia are unremarkable in appearance. The cerebral hemispheres demonstrate grossly normal gray-white differentiation. No mass effect or midline shift is seen. There is no evidence of fracture; visualized osseous structures are unremarkable in appearance. The visualized portions of the orbits are within normal limits. The patient is status post bilateral maxillary antrectomy and resection of most of the ethmoid air cells. The remaining  ethmoid air cells are opacified, with partial opacification of the frontal sinuses. The remaining paranasal sinuses and mastoid air cells are well-aerated. No significant soft tissue abnormalities are seen. IMPRESSION: 1. No acute intracranial pathology seen on CT. 2. Mild small vessel ischemic microangiopathy. 3. Partial opacification of the frontal sinuses. Electronically Signed   By: Garald Balding M.D.   On: 07/03/2015 19:37   Mr Jodene Nam Head Wo Contrast  07/03/2015  CLINICAL DATA:  Acute onset generalized weakness, slurring speech. History of prostate cancer, hyperlipidemia. EXAM: MRI HEAD WITHOUT CONTRAST MRA HEAD WITHOUT CONTRAST TECHNIQUE: Multiplanar, multiecho pulse sequences of the brain and surrounding structures were obtained without intravenous contrast. Angiographic images of the head were obtained using MRA technique without contrast. COMPARISON:  CT head July 03, 2015 at 1932 hours FINDINGS: MRI HEAD FINDINGS The ventricles and sulci are normal for patient's age. No  abnormal parenchymal signal, mass lesions, mass effect. No reduced diffusion to suggest acute ischemia. Scattered subcentimeter supratentorial white matter FLAIR T2 hyperintensities are less than expected for age, most compatible with chronic small vessel ischemic disease. No susceptibility artifact to suggest hemorrhage. No abnormal extra-axial fluid collections. No extra-axial masses though, contrast enhanced sequences would be more sensitive. Normal major intracranial vascular flow voids seen at the skull base. Ocular globes and orbital contents are non-suspicious though not tailored for evaluation. Status post bilateral ocular lens implants. No abnormal sellar expansion. No suspicious calvarial bone marrow signal. Craniocervical junction maintained. Mild to moderate paranasal sinus mucosal thickening, status post FESS. The mastoid air cells are well aerated. MRA HEAD FINDINGS Anterior circulation: Normal flow related enhancement of the included cervical, petrous, cavernous and supraclinoid internal carotid arteries. Patent anterior communicating artery. Normal flow related enhancement of the anterior and middle cerebral arteries, including distal segments. Negative. RIGHT A1 segment is developmentally dominant. No large vessel occlusion, high-grade stenosis, abnormal luminal irregularity, aneurysm. Posterior circulation: LEFT vertebral artery is dominant. Basilar artery is patent, with normal flow related enhancement of the main branch vessels. Normal flow related enhancement of the posterior cerebral arteries. Robust LEFT posterior communicating artery. No large vessel occlusion, high-grade stenosis, abnormal luminal irregularity, aneurysm. IMPRESSION: MRI HEAD: No acute intracranial process; negative MRI head for age. MRA HEAD: No acute large vessel occlusion; negative MRA head. Electronically Signed   By: Elon Alas M.D.   On: 07/03/2015 22:23   Mr Brain Wo Contrast  07/03/2015  CLINICAL DATA:   Acute onset generalized weakness, slurring speech. History of prostate cancer, hyperlipidemia. EXAM: MRI HEAD WITHOUT CONTRAST MRA HEAD WITHOUT CONTRAST TECHNIQUE: Multiplanar, multiecho pulse sequences of the brain and surrounding structures were obtained without intravenous contrast. Angiographic images of the head were obtained using MRA technique without contrast. COMPARISON:  CT head July 03, 2015 at 1932 hours FINDINGS: MRI HEAD FINDINGS The ventricles and sulci are normal for patient's age. No abnormal parenchymal signal, mass lesions, mass effect. No reduced diffusion to suggest acute ischemia. Scattered subcentimeter supratentorial white matter FLAIR T2 hyperintensities are less than expected for age, most compatible with chronic small vessel ischemic disease. No susceptibility artifact to suggest hemorrhage. No abnormal extra-axial fluid collections. No extra-axial masses though, contrast enhanced sequences would be more sensitive. Normal major intracranial vascular flow voids seen at the skull base. Ocular globes and orbital contents are non-suspicious though not tailored for evaluation. Status post bilateral ocular lens implants. No abnormal sellar expansion. No suspicious calvarial bone marrow signal. Craniocervical junction maintained. Mild to moderate paranasal sinus mucosal thickening, status post FESS. The  mastoid air cells are well aerated. MRA HEAD FINDINGS Anterior circulation: Normal flow related enhancement of the included cervical, petrous, cavernous and supraclinoid internal carotid arteries. Patent anterior communicating artery. Normal flow related enhancement of the anterior and middle cerebral arteries, including distal segments. Negative. RIGHT A1 segment is developmentally dominant. No large vessel occlusion, high-grade stenosis, abnormal luminal irregularity, aneurysm. Posterior circulation: LEFT vertebral artery is dominant. Basilar artery is patent, with normal flow related  enhancement of the main branch vessels. Normal flow related enhancement of the posterior cerebral arteries. Robust LEFT posterior communicating artery. No large vessel occlusion, high-grade stenosis, abnormal luminal irregularity, aneurysm. IMPRESSION: MRI HEAD: No acute intracranial process; negative MRI head for age. MRA HEAD: No acute large vessel occlusion; negative MRA head. Electronically Signed   By: Elon Alas M.D.   On: 07/03/2015 22:23    Assessment: 75 y.o. male hx of HLD, prostate CA presenting with transient episode of weakness and double vision. Currently asymptomatic. MRI brain negative for acute infarct. Admitted for TIA workup.    Plan: 1. HgbA1c, fasting lipid panel 2. MRI, MRA  of the brain unremarkable 3. PT consult, OT consult, Speech consult 4. Echocardiogram 5. Carotid dopplers 6. Prophylactic therapy-325mg  daily 7. Risk factor modification 8. Telemetry monitoring 9. Frequent neuro checks 10. NPO until RN stroke swallow screen    Jim Like, DO [facility] [phone]  If 7pm- 7am, please page neurology on call as listed in [place]. 07/03/2015, 10:46 PM

## 2015-07-03 NOTE — Progress Notes (Signed)
PHARMACIST - PHYSICIAN ORDER COMMUNICATION  CONCERNING: P&T Medication Policy on Herbal Medications  DESCRIPTION:  This patient's order for: Papaya/Pinapple Enzymes and Co Q-10 tablets   has been noted.  This product(s) is classified as an "herbal" or natural product. Due to a lack of definitive safety studies or FDA approval, nonstandard manufacturing practices, plus the potential risk of unknown drug-drug interactions while on inpatient medications, the Pharmacy and Therapeutics Committee does not permit the use of "herbal" or natural products of this type within [facility].   ACTION TAKEN: The pharmacy department is unable to verify this order at this time and your patient has been informed of this safety policy. Please reevaluate patient's clinical condition at discharge and address if the herbal or natural product(s) should be resumed at that time.  Thank Brodix, Salvino 07/03/2015 10:03 PM

## 2015-07-03 NOTE — ED Notes (Signed)
He phoned EMS d/t "felt weak while watching television".  He denies any pain or discomfort and is in no distress.  The only symptom I see upon exam is slight slurring of speech.

## 2015-07-03 NOTE — ED Notes (Signed)
Patient transported to MRI 

## 2015-07-04 ENCOUNTER — Inpatient Hospital Stay (HOSPITAL_COMMUNITY): Payer: BLUE CROSS/BLUE SHIELD

## 2015-07-04 ENCOUNTER — Inpatient Hospital Stay (HOSPITAL_COMMUNITY): Payer: Medicare Other

## 2015-07-04 ENCOUNTER — Encounter (HOSPITAL_COMMUNITY): Payer: Self-pay | Admitting: *Deleted

## 2015-07-04 DIAGNOSIS — E785 Hyperlipidemia, unspecified: Secondary | ICD-10-CM

## 2015-07-04 DIAGNOSIS — R531 Weakness: Secondary | ICD-10-CM

## 2015-07-04 DIAGNOSIS — H532 Diplopia: Principal | ICD-10-CM

## 2015-07-04 DIAGNOSIS — Z8546 Personal history of malignant neoplasm of prostate: Secondary | ICD-10-CM

## 2015-07-04 DIAGNOSIS — R270 Ataxia, unspecified: Secondary | ICD-10-CM

## 2015-07-04 LAB — LIPID PANEL
Cholesterol: 160 mg/dL (ref 0–200)
HDL: 35 mg/dL — ABNORMAL LOW (ref >40)
LDL CALC: 91 mg/dL (ref 0–99)
Total CHOL/HDL Ratio: 4.6 RATIO
Triglycerides: 168 mg/dL — ABNORMAL HIGH (ref <150)
VLDL: 34 mg/dL (ref 0–40)

## 2015-07-04 LAB — MAGNESIUM: Magnesium: 2.2 mg/dL (ref 1.7–2.4)

## 2015-07-04 NOTE — Discharge Summary (Signed)
Physician Discharge Summary  Shawn Bell [id number] DOB: 1940/09/26 DOA: 07/03/2015  PCP: Launa Grill, MD  Admit date: 07/03/2015 Discharge date: 07/04/2015  Time spent: 25 minutes  Recommendations for Outpatient Follow-up:  1. Discharge home with outpatient PCP follow-up   Discharge Diagnoses:  Principal Problem:   Double vision   Active Problems:   Hyperlipemia   Weakness   Discharge Condition: Fair  Diet recommendation: low fat diet  Filed Weights   07/04/15 0139  Weight: 94.3 kg (207 lb 14.3 oz)    History of present illness:   75-year-old male with history of hyperlipidemia, prostate cancer status post radiation (completed treatment), diverticulosis, hemorrhoids who presented with acute onset of double vision and some weakness. He developed sudden onset of weakness while watching TV yesterday afternoon and had some unilateral weakness with double vision. He denied headache, dizziness, facial asymmetry, slurred speech, chest pain, shortness of breath, palpitations, abdominal pain, nausea, vomiting, bowel urinary symptoms, no numbness of his extremities. Denied any recent travel or sick contact. In the ED vitals were stable. Head CT was negative for acute abnormality. Labs were unremarkable. Patient admitted for possible TIA. Neurology consulted.   Hospital Course:  Double vision Patient admitted to telemetry for TIA workup. His symptoms had completely resolved by the time he arrived to the ED. MRI/MRA of the brain was done which was unremarkable. 2-D echo, carotid Doppler ordered as a part of TIA workup. Patient informs me this morning that she has been taking lipoflavinoid for past 2 days. It is a supplement containing vitamins B3, B6, B12, and C. Its main active ingredient is eriodictyol glycoside. It is OTC supplement used for ringing in ears. Patient also showed me side effects associated with the medication which does include double vision and weakness. He reports  taking 6 tablets of those yesterday at once. -He does not have any underlying risk factors and is fairly healthy. He does not want to undergo further tests and I think it is reasonable to withhold further testing given his resolution of symptoms and negative MRI. I have instructed him to stop taking the supplement and to follow-up with his PCP in 1-2 weeks. If he has further symptoms he will need full neurological workup. Patient would continue baby aspirin and statin.  Stable for discharge home.   Procedures:  CT head  MRI/MRA brain  Consultations:  Neurology  Discharge Exam: Filed Vitals:   07/04/15 0139 07/04/15 0555  BP: 123/73 102/56  Pulse: 59 73  Temp: 97.8 F (36.6 C) 98.6 F (37 C)  Resp: 20 16    General: Elderly male not in distress HEENT: Moist mucosa, no carotid bruit Chest: Clear bilaterally CVS: Normal S1 and S2, no murmurs rubs or gallop GI: Soft, nondistended, nontender Musculoskeletal: Warm, no edema CNS: Alert and oriented   Discharge Instructions    Current Discharge Medication List    CONTINUE these medications which have NOT CHANGED   Details  aspirin 81 MG tablet Take 81 mg by mouth daily.    Coenzyme Q10 (CO Q-10) 50 MG CAPS Take by mouth.    Multiple Vitamins-Minerals (CENTRUM SILVER ADULT 50+ PO) Take by mouth.    rosuvastatin (CRESTOR) 5 MG tablet Take 1 tablet (5 mg total) by mouth daily. Needs office visit and labs-2nd notice Qty: 30 tablet, Refills: 0      STOP taking these medications     Digestive Enzymes (PAPAYA/PINEAPPLE ENZYMES PO)        No Known Allergies Follow-up Information  Follow up with COE,LORI, MD In 1 week.   Contact information:   [address] [place] [address] [phone]        The results of significant diagnostics from this hospitalization (including imaging, microbiology, ancillary and laboratory) are listed below for reference.    Significant Diagnostic Studies: Ct Head Wo  Contrast  07/03/2015  CLINICAL DATA:  Acute onset of generalized weakness. Slight slurring of speech. Initial encounter. EXAM: CT HEAD WITHOUT CONTRAST TECHNIQUE: Contiguous axial images were obtained from the base of the skull through the vertex without intravenous contrast. COMPARISON:  MRI of the brain performed 11/12/2013 FINDINGS: There is no evidence of acute infarction, mass lesion, or intra- or extra-axial hemorrhage on CT. Mild cerebellar atrophy is noted. Mild periventricular white matter change likely reflects small vessel ischemic microangiopathy. The brainstem and fourth ventricle are within normal limits. The basal ganglia are unremarkable in appearance. The cerebral hemispheres demonstrate grossly normal gray-white differentiation. No mass effect or midline shift is seen. There is no evidence of fracture; visualized osseous structures are unremarkable in appearance. The visualized portions of the orbits are within normal limits. The patient is status post bilateral maxillary antrectomy and resection of most of the ethmoid air cells. The remaining ethmoid air cells are opacified, with partial opacification of the frontal sinuses. The remaining paranasal sinuses and mastoid air cells are well-aerated. No significant soft tissue abnormalities are seen. IMPRESSION: 1. No acute intracranial pathology seen on CT. 2. Mild small vessel ischemic microangiopathy. 3. Partial opacification of the frontal sinuses. Electronically Signed   By: Garald Balding M.D.   On: 07/03/2015 19:37   Mr Jodene Nam Head Wo Contrast  07/03/2015  CLINICAL DATA:  Acute onset generalized weakness, slurring speech. History of prostate cancer, hyperlipidemia. EXAM: MRI HEAD WITHOUT CONTRAST MRA HEAD WITHOUT CONTRAST TECHNIQUE: Multiplanar, multiecho pulse sequences of the brain and surrounding structures were obtained without intravenous contrast. Angiographic images of the head were obtained using MRA technique without contrast.  COMPARISON:  CT head July 03, 2015 at 1932 hours FINDINGS: MRI HEAD FINDINGS The ventricles and sulci are normal for patient's age. No abnormal parenchymal signal, mass lesions, mass effect. No reduced diffusion to suggest acute ischemia. Scattered subcentimeter supratentorial white matter FLAIR T2 hyperintensities are less than expected for age, most compatible with chronic small vessel ischemic disease. No susceptibility artifact to suggest hemorrhage. No abnormal extra-axial fluid collections. No extra-axial masses though, contrast enhanced sequences would be more sensitive. Normal major intracranial vascular flow voids seen at the skull base. Ocular globes and orbital contents are non-suspicious though not tailored for evaluation. Status post bilateral ocular lens implants. No abnormal sellar expansion. No suspicious calvarial bone marrow signal. Craniocervical junction maintained. Mild to moderate paranasal sinus mucosal thickening, status post FESS. The mastoid air cells are well aerated. MRA HEAD FINDINGS Anterior circulation: Normal flow related enhancement of the included cervical, petrous, cavernous and supraclinoid internal carotid arteries. Patent anterior communicating artery. Normal flow related enhancement of the anterior and middle cerebral arteries, including distal segments. Negative. RIGHT A1 segment is developmentally dominant. No large vessel occlusion, high-grade stenosis, abnormal luminal irregularity, aneurysm. Posterior circulation: LEFT vertebral artery is dominant. Basilar artery is patent, with normal flow related enhancement of the main branch vessels. Normal flow related enhancement of the posterior cerebral arteries. Robust LEFT posterior communicating artery. No large vessel occlusion, high-grade stenosis, abnormal luminal irregularity, aneurysm. IMPRESSION: MRI HEAD: No acute intracranial process; negative MRI head for age. MRA HEAD: No acute large vessel  occlusion; negative MRA  head. Electronically Signed   By: Elon Alas M.D.   On: 07/03/2015 22:23   Mr Brain Wo Contrast  07/03/2015  CLINICAL DATA:  Acute onset generalized weakness, slurring speech. History of prostate cancer, hyperlipidemia. EXAM: MRI HEAD WITHOUT CONTRAST MRA HEAD WITHOUT CONTRAST TECHNIQUE: Multiplanar, multiecho pulse sequences of the brain and surrounding structures were obtained without intravenous contrast. Angiographic images of the head were obtained using MRA technique without contrast. COMPARISON:  CT head July 03, 2015 at 1932 hours FINDINGS: MRI HEAD FINDINGS The ventricles and sulci are normal for patient's age. No abnormal parenchymal signal, mass lesions, mass effect. No reduced diffusion to suggest acute ischemia. Scattered subcentimeter supratentorial white matter FLAIR T2 hyperintensities are less than expected for age, most compatible with chronic small vessel ischemic disease. No susceptibility artifact to suggest hemorrhage. No abnormal extra-axial fluid collections. No extra-axial masses though, contrast enhanced sequences would be more sensitive. Normal major intracranial vascular flow voids seen at the skull base. Ocular globes and orbital contents are non-suspicious though not tailored for evaluation. Status post bilateral ocular lens implants. No abnormal sellar expansion. No suspicious calvarial bone marrow signal. Craniocervical junction maintained. Mild to moderate paranasal sinus mucosal thickening, status post FESS. The mastoid air cells are well aerated. MRA HEAD FINDINGS Anterior circulation: Normal flow related enhancement of the included cervical, petrous, cavernous and supraclinoid internal carotid arteries. Patent anterior communicating artery. Normal flow related enhancement of the anterior and middle cerebral arteries, including distal segments. Negative. RIGHT A1 segment is developmentally dominant. No large vessel occlusion, high-grade stenosis, abnormal luminal  irregularity, aneurysm. Posterior circulation: LEFT vertebral artery is dominant. Basilar artery is patent, with normal flow related enhancement of the main branch vessels. Normal flow related enhancement of the posterior cerebral arteries. Robust LEFT posterior communicating artery. No large vessel occlusion, high-grade stenosis, abnormal luminal irregularity, aneurysm. IMPRESSION: MRI HEAD: No acute intracranial process; negative MRI head for age. MRA HEAD: No acute large vessel occlusion; negative MRA head. Electronically Signed   By: Elon Alas M.D.   On: 07/03/2015 22:23    Microbiology: No results found for this or any previous visit (from the past 240 hour(s)).   Labs: Basic Metabolic Panel:  Recent Labs Lab 07/03/15 1950 07/04/15 0618  NA 140  --   K 3.7  --   CL 107  --   CO2 25  --   GLUCOSE 106*  --   BUN 17  --   CREATININE 1.01  --   CALCIUM 8.8*  --   MG  --  2.2   Liver Function Tests: No results for input(s): AST, ALT, ALKPHOS, BILITOT, PROT, ALBUMIN in the last 168 hours. No results for input(s): LIPASE, AMYLASE in the last 168 hours. No results for input(s): AMMONIA in the last 168 hours. CBC:  Recent Labs Lab 07/03/15 1950  WBC 5.4  NEUTROABS 3.1  HGB 15.1  HCT 44.3  MCV 95.3  PLT 198   Cardiac Enzymes: No results for input(s): CKTOTAL, CKMB, CKMBINDEX, TROPONINI in the last 168 hours. BNP: BNP (last 3 results) No results for input(s): BNP in the last 8760 hours.  ProBNP (last 3 results) No results for input(s): PROBNP in the last 8760 hours.  CBG: No results for input(s): GLUCAP in the last 168 hours.     Signed:  Louellen Molder MD.  [facility] 07/04/2015, 8:57 AM

## 2015-07-04 NOTE — Progress Notes (Signed)
RN notified the Admissions in [place] that there is currently no attending. Awaiting a call back

## 2015-07-04 NOTE — Progress Notes (Signed)
Attempted Carotid Duplex at 8:30am. Patient refused. He said he was waiting for MD to give him papers to sign, then he was leaving. Notified RN.  Landry Mellow, RDMS, RVT 07/04/2015

## 2015-07-05 LAB — HEMOGLOBIN A1C
HEMOGLOBIN A1C: 5.3 % (ref 4.8–5.6)
MEAN PLASMA GLUCOSE: 105 mg/dL

## 2017-03-13 ENCOUNTER — Other Ambulatory Visit: Payer: Self-pay | Admitting: Orthopedic Surgery

## 2017-03-13 DIAGNOSIS — M19011 Primary osteoarthritis, right shoulder: Secondary | ICD-10-CM

## 2017-03-19 ENCOUNTER — Ambulatory Visit
Admission: RE | Admit: 2017-03-19 | Discharge: 2017-03-19 | Disposition: A | Discharge status: Home / Self Care | Payer: BLUE CROSS/BLUE SHIELD | Source: Ambulatory Visit | Attending: Orthopedic Surgery | Admitting: Orthopedic Surgery

## 2017-03-19 DIAGNOSIS — M19011 Primary osteoarthritis, right shoulder: Secondary | ICD-10-CM

## 2017-04-30 ENCOUNTER — Encounter (HOSPITAL_COMMUNITY): Payer: Self-pay

## 2017-04-30 NOTE — Pre-Procedure Instructions (Signed)
Shawn Bell  04/30/2017     No Pharmacies Listed   Your procedure is scheduled on  Thursday, November 15th   Report to [facility] Admitting at 10:45 AM             (posted surgery time 12:45p - 2:45p)   Call this number if you have problems the morning of surgery:  [phone]   Remember:               4-5 days prior to surgery, STOP TAKING any Vitamins, Herbal Supplements, Anti-inflammatories.   Do not eat food or drink liquids after midnight Wednesday.   Take these medicines the morning of surgery with A SIP OF WATER : NONE   Do not wear jewelry, no rings or watches.  Do not wear lotions, colognes or deoderant.   Men may shave face and neck.   Do not bring valuables to the hospital.  [facility] is not responsible for any belongings or valuables.  Contacts, dentures or bridgework may not be worn into surgery.  Leave your suitcase in the car.  After surgery it may be brought to your room.  For patients admitted to the hospital, discharge time will be determined by your treatment team.  Please read over the following fact sheets that you were given. Pain Booklet, MRSA Information and Surgical Site Infection Prevention       [place]- Preparing For Surgery  Before surgery, you can play an important role. Because skin is not sterile, your skin needs to be as free of germs as possible. You can reduce the number of germs on your skin by washing with CHG (chlorahexidine gluconate) Soap before surgery.  CHG is an antiseptic cleaner which kills germs and bonds with the skin to continue killing germs even after washing.  Please do not use if you have an allergy to CHG or antibacterial soaps. If your skin becomes reddened/irritated stop using the CHG.  Do not shave (including legs and underarms) for at least 48 hours prior to first CHG shower. It is OK to shave your face.  Please follow these instructions carefully.   1. Shower the NIGHT BEFORE SURGERY  and the MORNING OF SURGERY with CHG.   2. If you chose to wash your hair, wash your hair first as usual with your normal shampoo.  3. After you shampoo, rinse your hair and body thoroughly to remove the shampoo.  4. Use CHG as you would any other liquid soap. You can apply CHG directly to the skin and wash gently with a scrungie or a clean washcloth.   5. Apply the CHG Soap to your body ONLY FROM THE NECK DOWN.  Do not use on open wounds or open sores. Avoid contact with your eyes, ears, mouth and genitals (private parts). Wash Face and genitals (private parts)  with your normal soap.  6. Wash thoroughly, paying special attention to the area where your surgery will be performed.  7. Thoroughly rinse your body with warm water from the neck down.  8. DO NOT shower/wash with your normal soap after using and rinsing off the CHG Soap.  9. Pat yourself dry with a CLEAN TOWEL.  10. Wear CLEAN PAJAMAS to bed the night before surgery, wear comfortable clothes the morning of surgery  11. Place CLEAN SHEETS on your bed the night of your first shower and DO NOT SLEEP WITH PETS.    Day of Surgery: Do not apply any deodorants/lotions.  Please wear clean clothes to the hospital/surgery center.

## 2017-05-01 ENCOUNTER — Encounter (HOSPITAL_COMMUNITY): Payer: Self-pay

## 2017-05-01 ENCOUNTER — Other Ambulatory Visit: Payer: Self-pay

## 2017-05-01 ENCOUNTER — Encounter (HOSPITAL_COMMUNITY)
Admission: RE | Admit: 2017-05-01 | Discharge: 2017-05-01 | Disposition: A | Discharge status: Home / Self Care | Payer: BLUE CROSS/BLUE SHIELD | Source: Ambulatory Visit | Attending: Orthopedic Surgery | Admitting: Orthopedic Surgery

## 2017-05-01 DIAGNOSIS — C61 Malignant neoplasm of prostate: Secondary | ICD-10-CM | POA: Diagnosis not present

## 2017-05-01 DIAGNOSIS — M12812 Other specific arthropathies, not elsewhere classified, left shoulder: Secondary | ICD-10-CM | POA: Diagnosis not present

## 2017-05-01 DIAGNOSIS — Z96643 Presence of artificial hip joint, bilateral: Secondary | ICD-10-CM | POA: Diagnosis not present

## 2017-05-01 DIAGNOSIS — G8918 Other acute postprocedural pain: Secondary | ICD-10-CM | POA: Diagnosis not present

## 2017-05-01 DIAGNOSIS — M19012 Primary osteoarthritis, left shoulder: Secondary | ICD-10-CM | POA: Diagnosis not present

## 2017-05-01 DIAGNOSIS — E785 Hyperlipidemia, unspecified: Secondary | ICD-10-CM | POA: Diagnosis not present

## 2017-05-01 DIAGNOSIS — Z923 Personal history of irradiation: Secondary | ICD-10-CM | POA: Diagnosis not present

## 2017-05-01 DIAGNOSIS — M129 Arthropathy, unspecified: Secondary | ICD-10-CM | POA: Diagnosis not present

## 2017-05-01 DIAGNOSIS — Z8546 Personal history of malignant neoplasm of prostate: Secondary | ICD-10-CM | POA: Diagnosis not present

## 2017-05-01 DIAGNOSIS — M75102 Unspecified rotator cuff tear or rupture of left shoulder, not specified as traumatic: Secondary | ICD-10-CM | POA: Diagnosis not present

## 2017-05-01 HISTORY — DX: Other specified health status: Z78.9

## 2017-05-01 HISTORY — DX: Unspecified osteoarthritis, unspecified site: M19.90

## 2017-05-01 LAB — BASIC METABOLIC PANEL
Anion gap: 9 (ref 5–15)
BUN: 12 mg/dL (ref 6–20)
CHLORIDE: 104 mmol/L (ref 101–111)
CO2: 24 mmol/L (ref 22–32)
Calcium: 9.6 mg/dL (ref 8.9–10.3)
Creatinine, Ser: 1.06 mg/dL (ref 0.61–1.24)
GFR calc Af Amer: >60 mL/min (ref >60)
GFR calc non Af Amer: >60 mL/min (ref >60)
Glucose, Bld: 86 mg/dL (ref 65–99)
Potassium: 4.6 mmol/L (ref 3.5–5.1)
SODIUM: 137 mmol/L (ref 135–145)

## 2017-05-01 LAB — CBC
HEMATOCRIT: 46.6 % (ref 39.0–52.0)
HEMOGLOBIN: 15.7 g/dL (ref 13.0–17.0)
MCH: 32.4 pg (ref 26.0–34.0)
MCHC: 33.7 g/dL (ref 30.0–36.0)
MCV: 96.3 fL (ref 78.0–100.0)
Platelets: 225 10*3/uL (ref 150–400)
RBC: 4.84 MIL/uL (ref 4.22–5.81)
RDW: 12.8 % (ref 11.5–15.5)
WBC: 6.3 10*3/uL (ref 4.0–10.5)

## 2017-05-01 LAB — SURGICAL PCR SCREEN
MRSA, PCR: NEGATIVE
Staphylococcus aureus: NEGATIVE

## 2017-05-01 NOTE — Pre-Procedure Instructions (Signed)
Shawn Bell  05/01/2017     No Pharmacies Listed   Your procedure is scheduled on  Thursday, November 15th   Report to [facility] Admitting at 10:45 AM             (posted surgery time 12:45p - 2:45p)   Call this number if you have problems the morning of surgery:  [phone]   Remember:               4-5 days prior to surgery, STOP TAKING any ASPIRIN, Vitamins, Herbal Supplements, Anti-inflammatories.   Do not eat food or drink liquids after midnight Wednesday.   Take these medicines the morning of surgery with A SIP OF WATER : NONE   Do not wear jewelry, no rings or watches.  Do not wear lotions, colognes or deoderant.   Men may shave face and neck.   Do not bring valuables to the hospital.  [facility] is not responsible for any belongings or valuables.  Contacts, dentures or bridgework may not be worn into surgery.  Leave your suitcase in the car.  After surgery it may be brought to your room.  For patients admitted to the hospital, discharge time will be determined by your treatment team.  Please read over the following fact sheets that you were given. Pain Booklet, MRSA Information and Surgical Site Infection Prevention       [place]- Preparing For Surgery  Before surgery, you can play an important role. Because skin is not sterile, your skin needs to be as free of germs as possible. You can reduce the number of germs on your skin by washing with CHG (chlorahexidine gluconate) Soap before surgery.  CHG is an antiseptic cleaner which kills germs and bonds with the skin to continue killing germs even after washing.  Please do not use if you have an allergy to CHG or antibacterial soaps. If your skin becomes reddened/irritated stop using the CHG.  Do not shave (including legs and underarms) for at least 48 hours prior to first CHG shower. It is OK to shave your face.  Please follow these instructions carefully.   1. Shower the NIGHT BEFORE  SURGERY and the MORNING OF SURGERY with CHG.   2. If you chose to wash your hair, wash your hair first as usual with your normal shampoo.  3. After you shampoo, rinse your hair and body thoroughly to remove the shampoo.  4. Use CHG as you would any other liquid soap. You can apply CHG directly to the skin and wash gently with a scrungie or a clean washcloth.   5. Apply the CHG Soap to your body ONLY FROM THE NECK DOWN.  Do not use on open wounds or open sores. Avoid contact with your eyes, ears, mouth and genitals (private parts). Wash Face and genitals (private parts)  with your normal soap.  6. Wash thoroughly, paying special attention to the area where your surgery will be performed.  7. Thoroughly rinse your body with warm water from the neck down.  8. DO NOT shower/wash with your normal soap after using and rinsing off the CHG Soap.  9. Pat yourself dry with a CLEAN TOWEL.  10. Wear CLEAN PAJAMAS to bed the night before surgery, wear comfortable clothes the morning of surgery  11. Place CLEAN SHEETS on your bed the night of your first shower and DO NOT SLEEP WITH PETS.    Day of Surgery: Do not apply any  deodorants/lotions. Please wear clean clothes to the hospital/surgery center.

## 2017-05-02 MED ORDER — TRANEXAMIC ACID 1000 MG/10ML IV SOLN
1000.0000 mg | INTRAVENOUS | Status: AC
  Administered 2017-05-03: 1000 mg via INTRAVENOUS
  Filled 2017-05-02: qty 1100

## 2017-05-03 ENCOUNTER — Other Ambulatory Visit: Payer: Self-pay

## 2017-05-03 ENCOUNTER — Encounter (HOSPITAL_COMMUNITY): Payer: Self-pay | Admitting: Certified Registered Nurse Anesthetist

## 2017-05-03 ENCOUNTER — Inpatient Hospital Stay (HOSPITAL_COMMUNITY)
Admission: RE | Admit: 2017-05-03 | Discharge: 2017-05-04 | DRG: 483 | Disposition: A | Discharge status: Home / Self Care | Payer: BLUE CROSS/BLUE SHIELD | Source: Ambulatory Visit | Attending: Orthopedic Surgery | Admitting: Orthopedic Surgery

## 2017-05-03 ENCOUNTER — Inpatient Hospital Stay (HOSPITAL_COMMUNITY): Payer: BLUE CROSS/BLUE SHIELD | Admitting: Anesthesiology

## 2017-05-03 ENCOUNTER — Encounter (HOSPITAL_COMMUNITY)
Admission: RE | Disposition: A | Discharge status: Home / Self Care | Payer: Self-pay | Source: Ambulatory Visit | Attending: Orthopedic Surgery

## 2017-05-03 DIAGNOSIS — M19012 Primary osteoarthritis, left shoulder: Secondary | ICD-10-CM | POA: Diagnosis not present

## 2017-05-03 DIAGNOSIS — E785 Hyperlipidemia, unspecified: Secondary | ICD-10-CM | POA: Diagnosis present

## 2017-05-03 DIAGNOSIS — M129 Arthropathy, unspecified: Secondary | ICD-10-CM | POA: Diagnosis present

## 2017-05-03 DIAGNOSIS — Z96643 Presence of artificial hip joint, bilateral: Secondary | ICD-10-CM | POA: Diagnosis present

## 2017-05-03 DIAGNOSIS — M75102 Unspecified rotator cuff tear or rupture of left shoulder, not specified as traumatic: Secondary | ICD-10-CM | POA: Diagnosis present

## 2017-05-03 DIAGNOSIS — Z923 Personal history of irradiation: Secondary | ICD-10-CM

## 2017-05-03 DIAGNOSIS — Z8546 Personal history of malignant neoplasm of prostate: Secondary | ICD-10-CM

## 2017-05-03 DIAGNOSIS — Z96619 Presence of unspecified artificial shoulder joint: Secondary | ICD-10-CM

## 2017-05-03 HISTORY — PX: REVERSE SHOULDER ARTHROPLASTY: SHX5054

## 2017-05-03 SURGERY — ARTHROPLASTY, SHOULDER, TOTAL, REVERSE
Anesthesia: General | Site: Shoulder | Laterality: Left

## 2017-05-03 MED ORDER — METOCLOPRAMIDE HCL 5 MG PO TABS: 5.0000 mg | ORAL_TABLET | Freq: Three times a day (TID) | ORAL | Status: DC | PRN

## 2017-05-03 MED ORDER — MENTHOL 3 MG MT LOZG
1.0000 | LOZENGE | OROMUCOSAL | Status: DC | PRN
  Administered 2017-05-03: 3 mg via ORAL
  Filled 2017-05-03: qty 9

## 2017-05-03 MED ORDER — LACTATED RINGERS IV SOLN
INTRAVENOUS | Status: DC
  Administered 2017-05-03: 17:00:00 via INTRAVENOUS

## 2017-05-03 MED ORDER — CHLORHEXIDINE GLUCONATE 4 % EX LIQD: 60.0000 mL | Freq: Once | CUTANEOUS | Status: DC

## 2017-05-03 MED ORDER — OXYCODONE HCL 5 MG PO TABS: 5.0000 mg | ORAL_TABLET | Freq: Once | ORAL | Status: DC | PRN

## 2017-05-03 MED ORDER — FENTANYL CITRATE (PF) 100 MCG/2ML IJ SOLN
50.0000 ug | Freq: Once | INTRAMUSCULAR | Status: AC
  Administered 2017-05-03: 50 ug via INTRAVENOUS

## 2017-05-03 MED ORDER — METOCLOPRAMIDE HCL 5 MG/ML IJ SOLN: 5.0000 mg | Freq: Three times a day (TID) | INTRAMUSCULAR | Status: DC | PRN

## 2017-05-03 MED ORDER — DIPHENHYDRAMINE HCL 12.5 MG/5ML PO ELIX: 12.5000 mg | ORAL_SOLUTION | ORAL | Status: DC | PRN

## 2017-05-03 MED ORDER — OXYCODONE HCL 5 MG PO TABS: 5.0000 mg | ORAL_TABLET | ORAL | Status: DC | PRN

## 2017-05-03 MED ORDER — POLYETHYLENE GLYCOL 3350 17 G PO PACK: 17.0000 g | PACK | Freq: Every day | ORAL | Status: DC | PRN

## 2017-05-03 MED ORDER — SUGAMMADEX SODIUM 200 MG/2ML IV SOLN
INTRAVENOUS | Status: DC | PRN
  Administered 2017-05-03: 200 mg via INTRAVENOUS

## 2017-05-03 MED ORDER — ROCURONIUM BROMIDE 10 MG/ML (PF) SYRINGE
PREFILLED_SYRINGE | INTRAVENOUS | Status: DC | PRN
  Administered 2017-05-03: 20 mg via INTRAVENOUS
  Administered 2017-05-03: 50 mg via INTRAVENOUS

## 2017-05-03 MED ORDER — DOCUSATE SODIUM 100 MG PO CAPS
100.0000 mg | ORAL_CAPSULE | Freq: Two times a day (BID) | ORAL | Status: DC
  Administered 2017-05-03 – 2017-05-04 (×2): 100 mg via ORAL
  Filled 2017-05-03 (×2): qty 1

## 2017-05-03 MED ORDER — ASPIRIN 81 MG PO CHEW
81.0000 mg | CHEWABLE_TABLET | Freq: Every day | ORAL | Status: DC
  Administered 2017-05-03 – 2017-05-04 (×2): 81 mg via ORAL
  Filled 2017-05-03 (×2): qty 1

## 2017-05-03 MED ORDER — LIDOCAINE 2% (20 MG/ML) 5 ML SYRINGE
INTRAMUSCULAR | Status: DC | PRN
  Administered 2017-05-03: 40 mg via INTRAVENOUS

## 2017-05-03 MED ORDER — FENTANYL CITRATE (PF) 250 MCG/5ML IJ SOLN
INTRAMUSCULAR | Status: AC
  Filled 2017-05-03: qty 5

## 2017-05-03 MED ORDER — ALUM & MAG HYDROXIDE-SIMETH 200-200-20 MG/5ML PO SUSP: 30.0000 mL | ORAL | Status: DC | PRN

## 2017-05-03 MED ORDER — KETOROLAC TROMETHAMINE 15 MG/ML IJ SOLN
7.5000 mg | Freq: Four times a day (QID) | INTRAMUSCULAR | Status: DC
  Administered 2017-05-03 – 2017-05-04 (×3): 7.5 mg via INTRAVENOUS
  Filled 2017-05-03 (×3): qty 1

## 2017-05-03 MED ORDER — MIDAZOLAM HCL 2 MG/2ML IJ SOLN
INTRAMUSCULAR | Status: AC
  Filled 2017-05-03: qty 2

## 2017-05-03 MED ORDER — OXYCODONE HCL 5 MG/5ML PO SOLN: 5.0000 mg | Freq: Once | ORAL | Status: DC | PRN

## 2017-05-03 MED ORDER — DEXAMETHASONE SODIUM PHOSPHATE 10 MG/ML IJ SOLN
INTRAMUSCULAR | Status: DC | PRN
  Administered 2017-05-03: 10 mg via INTRAVENOUS

## 2017-05-03 MED ORDER — ONDANSETRON HCL 4 MG PO TABS: 4.0000 mg | ORAL_TABLET | Freq: Four times a day (QID) | ORAL | Status: DC | PRN

## 2017-05-03 MED ORDER — LIDOCAINE 2% (20 MG/ML) 5 ML SYRINGE
INTRAMUSCULAR | Status: AC
  Filled 2017-05-03: qty 5

## 2017-05-03 MED ORDER — MIDAZOLAM HCL 2 MG/2ML IJ SOLN
1.0000 mg | Freq: Once | INTRAMUSCULAR | Status: AC
  Administered 2017-05-03: 1 mg via INTRAVENOUS

## 2017-05-03 MED ORDER — FENTANYL CITRATE (PF) 100 MCG/2ML IJ SOLN
INTRAMUSCULAR | Status: AC
  Administered 2017-05-03: 50 ug via INTRAVENOUS
  Filled 2017-05-03: qty 2

## 2017-05-03 MED ORDER — FENTANYL CITRATE (PF) 100 MCG/2ML IJ SOLN: 25.0000 ug | INTRAMUSCULAR | Status: DC | PRN

## 2017-05-03 MED ORDER — LACTATED RINGERS IV SOLN
INTRAVENOUS | Status: DC
  Administered 2017-05-03 (×2): via INTRAVENOUS
  Administered 2017-05-03: 50 mL/h via INTRAVENOUS

## 2017-05-03 MED ORDER — SUGAMMADEX SODIUM 200 MG/2ML IV SOLN
INTRAVENOUS | Status: AC
  Filled 2017-05-03: qty 2

## 2017-05-03 MED ORDER — ONDANSETRON HCL 4 MG/2ML IJ SOLN
INTRAMUSCULAR | Status: DC | PRN
  Administered 2017-05-03: 4 mg via INTRAVENOUS

## 2017-05-03 MED ORDER — MAGNESIUM CITRATE PO SOLN: 1.0000 | Freq: Once | ORAL | Status: DC | PRN

## 2017-05-03 MED ORDER — ONDANSETRON HCL 4 MG/2ML IJ SOLN
4.0000 mg | Freq: Once | INTRAMUSCULAR | Status: DC | PRN
Start: 2017-05-03 — End: 2017-05-03

## 2017-05-03 MED ORDER — ONDANSETRON HCL 4 MG/2ML IJ SOLN
4.0000 mg | Freq: Four times a day (QID) | INTRAMUSCULAR | Status: DC | PRN
  Filled 2017-05-03: qty 2

## 2017-05-03 MED ORDER — ONDANSETRON HCL 4 MG/2ML IJ SOLN
INTRAMUSCULAR | Status: AC
  Filled 2017-05-03: qty 2

## 2017-05-03 MED ORDER — PROPOFOL 10 MG/ML IV BOLUS
INTRAVENOUS | Status: AC
  Filled 2017-05-03: qty 20

## 2017-05-03 MED ORDER — OXYCODONE HCL 5 MG PO TABS
10.0000 mg | ORAL_TABLET | ORAL | Status: DC | PRN
  Administered 2017-05-04 (×2): 10 mg via ORAL
  Filled 2017-05-03 (×4): qty 2

## 2017-05-03 MED ORDER — ROCURONIUM BROMIDE 10 MG/ML (PF) SYRINGE
PREFILLED_SYRINGE | INTRAVENOUS | Status: AC
  Filled 2017-05-03: qty 5

## 2017-05-03 MED ORDER — ACETAMINOPHEN 650 MG RE SUPP: 650.0000 mg | RECTAL | Status: DC | PRN

## 2017-05-03 MED ORDER — PHENYLEPHRINE HCL 10 MG/ML IJ SOLN
INTRAVENOUS | Status: DC | PRN
  Administered 2017-05-03: 80 ug/min via INTRAVENOUS
  Administered 2017-05-03: 50 ug/min via INTRAVENOUS
  Administered 2017-05-03: 70 ug/min via INTRAVENOUS

## 2017-05-03 MED ORDER — DEXAMETHASONE SODIUM PHOSPHATE 10 MG/ML IJ SOLN
INTRAMUSCULAR | Status: AC
  Filled 2017-05-03: qty 1

## 2017-05-03 MED ORDER — CEFAZOLIN SODIUM-DEXTROSE 2-4 GM/100ML-% IV SOLN
2.0000 g | Freq: Four times a day (QID) | INTRAVENOUS | Status: AC
  Administered 2017-05-03 – 2017-05-04 (×3): 2 g via INTRAVENOUS
  Filled 2017-05-03 (×3): qty 100

## 2017-05-03 MED ORDER — 0.9 % SODIUM CHLORIDE (POUR BTL) OPTIME
TOPICAL | Status: DC | PRN
  Administered 2017-05-03: 1000 mL

## 2017-05-03 MED ORDER — DIAZEPAM 5 MG PO TABS: 2.5000 mg | ORAL_TABLET | Freq: Four times a day (QID) | ORAL | Status: DC | PRN

## 2017-05-03 MED ORDER — CEFAZOLIN SODIUM-DEXTROSE 2-4 GM/100ML-% IV SOLN
2.0000 g | INTRAVENOUS | Status: AC
  Administered 2017-05-03: 2 g via INTRAVENOUS
  Filled 2017-05-03: qty 100

## 2017-05-03 MED ORDER — ACETAMINOPHEN 325 MG PO TABS: 650.0000 mg | ORAL_TABLET | ORAL | Status: DC | PRN

## 2017-05-03 MED ORDER — ZOLPIDEM TARTRATE 5 MG PO TABS
5.0000 mg | ORAL_TABLET | Freq: Every evening | ORAL | Status: DC | PRN
  Administered 2017-05-04: 5 mg via ORAL
  Filled 2017-05-03: qty 1

## 2017-05-03 MED ORDER — HYDROMORPHONE HCL 1 MG/ML IJ SOLN
1.0000 mg | INTRAMUSCULAR | Status: DC | PRN
  Administered 2017-05-03 – 2017-05-04 (×2): 1 mg via INTRAVENOUS
  Filled 2017-05-03 (×2): qty 1

## 2017-05-03 MED ORDER — PROPOFOL 10 MG/ML IV BOLUS
INTRAVENOUS | Status: DC | PRN
  Administered 2017-05-03: 190 mg via INTRAVENOUS

## 2017-05-03 MED ORDER — BISACODYL 5 MG PO TBEC: 5.0000 mg | DELAYED_RELEASE_TABLET | Freq: Every day | ORAL | Status: DC | PRN

## 2017-05-03 MED ORDER — PHENOL 1.4 % MT LIQD
1.0000 | OROMUCOSAL | Status: DC | PRN
Start: 2017-05-03 — End: 2017-05-04

## 2017-05-03 SURGICAL SUPPLY — 68 items
ADH SKN CLS APL DERMABOND .7 (GAUZE/BANDAGES/DRESSINGS) ×1
ADH SKN CLS LQ APL DERMABOND (GAUZE/BANDAGES/DRESSINGS) ×1
AID PSTN UNV HD RSTRNT DISP (MISCELLANEOUS) ×1
BASEPLATE GLENOID SHLDR SM (Shoulder) ×2 IMPLANT
BLADE SAW SGTL 83.5X18.5 (BLADE) ×3 IMPLANT
BSPLAT GLND SM PRFT SHLDR CA (Shoulder) ×1 IMPLANT
COVER SURGICAL LIGHT HANDLE (MISCELLANEOUS) ×3 IMPLANT
CUP SUT UNIV REVERS 39 NEU (Shoulder) ×2 IMPLANT
DERMABOND ADHESIVE PROPEN (GAUZE/BANDAGES/DRESSINGS) ×2
DERMABOND ADVANCED (GAUZE/BANDAGES/DRESSINGS) ×2
DERMABOND ADVANCED .7 DNX12 (GAUZE/BANDAGES/DRESSINGS) ×1 IMPLANT
DERMABOND ADVANCED .7 DNX6 (GAUZE/BANDAGES/DRESSINGS) IMPLANT
DRAPE ORTHO SPLIT 77X108 STRL (DRAPES) ×6
DRAPE SURG 17X11 SM STRL (DRAPES) ×3 IMPLANT
DRAPE SURG ORHT 6 SPLT 77X108 (DRAPES) ×2 IMPLANT
DRAPE U-SHAPE 47X51 STRL (DRAPES) ×3 IMPLANT
DRSG AQUACEL AG ADV 3.5X10 (GAUZE/BANDAGES/DRESSINGS) ×3 IMPLANT
DURAPREP 26ML APPLICATOR (WOUND CARE) ×3 IMPLANT
ELECT BLADE 4.0 EZ CLEAN MEGAD (MISCELLANEOUS) ×3
ELECT CAUTERY BLADE 6.4 (BLADE) ×3 IMPLANT
ELECT REM PT RETURN 9FT ADLT (ELECTROSURGICAL) ×3
ELECTRODE BLDE 4.0 EZ CLN MEGD (MISCELLANEOUS) ×1 IMPLANT
ELECTRODE REM PT RTRN 9FT ADLT (ELECTROSURGICAL) ×1 IMPLANT
FACESHIELD WRAPAROUND (MASK) ×9 IMPLANT
FACESHIELD WRAPAROUND OR TEAM (MASK) ×3 IMPLANT
GLENOSPHERE LAT 39+4 SHOULDER (Shoulder) ×2 IMPLANT
GLOVE BIO SURGEON STRL SZ7.5 (GLOVE) ×3 IMPLANT
GLOVE BIO SURGEON STRL SZ8 (GLOVE) ×3 IMPLANT
GLOVE EUDERMIC 7 POWDERFREE (GLOVE) ×3 IMPLANT
GLOVE SS BIOGEL STRL SZ 7.5 (GLOVE) ×1 IMPLANT
GLOVE SUPERSENSE BIOGEL SZ 7.5 (GLOVE) ×2
GOWN STRL REUS W/ TWL LRG LVL3 (GOWN DISPOSABLE) ×1 IMPLANT
GOWN STRL REUS W/ TWL XL LVL3 (GOWN DISPOSABLE) ×2 IMPLANT
GOWN STRL REUS W/TWL LRG LVL3 (GOWN DISPOSABLE) ×3
GOWN STRL REUS W/TWL XL LVL3 (GOWN DISPOSABLE) ×6
INSERT HUMERAL MED 39/ +3 (Shoulder) ×1 IMPLANT
INSERT MEDIUM HUMERAL 39/ +3 (Shoulder) ×2 IMPLANT
KIT BASIN OR (CUSTOM PROCEDURE TRAY) ×3 IMPLANT
KIT ROOM TURNOVER OR (KITS) ×3 IMPLANT
MANIFOLD NEPTUNE II (INSTRUMENTS) ×3 IMPLANT
NDL SUT 6 .5 CRC .975X.05 MAYO (NEEDLE) IMPLANT
NDL TAPERED W/ NITINOL LOOP (MISCELLANEOUS) ×1 IMPLANT
NEEDLE MAYO TAPER (NEEDLE)
NEEDLE TAPERED W/ NITINOL LOOP (MISCELLANEOUS) ×3 IMPLANT
NS IRRIG 1000ML POUR BTL (IV SOLUTION) ×3 IMPLANT
PACK SHOULDER (CUSTOM PROCEDURE TRAY) ×3 IMPLANT
PAD ARMBOARD 7.5X6 YLW CONV (MISCELLANEOUS) ×6 IMPLANT
RESTRAINT HEAD UNIVERSAL NS (MISCELLANEOUS) ×3 IMPLANT
SCREW CENTRAL NONLOCK 25MM (Screw) ×2 IMPLANT
SCREW LOCK PERIPHERAL 30MM (Shoulder) ×4 IMPLANT
SET PIN UNIVERSAL REVERSE (SET/KITS/TRAYS/PACK) ×2 IMPLANT
SLING ARM FOAM STRAP LRG (SOFTGOODS) IMPLANT
SLING ARM FOAM STRAP XLG (SOFTGOODS) ×2 IMPLANT
SPACER REVERSE UNI 39/ +6MM (Shoulder) ×1 IMPLANT
SPACER SHLD UNI REV 39 +6 (Shoulder) ×1 IMPLANT
SPONGE LAP 18X18 X RAY DECT (DISPOSABLE) ×3 IMPLANT
SPONGE LAP 4X18 X RAY DECT (DISPOSABLE) ×3 IMPLANT
STEM REVERSE SIZE8 (Stem) ×2 IMPLANT
SUT FIBERWIRE #2 38 T-5 BLUE (SUTURE) ×9
SUT MNCRL AB 3-0 PS2 18 (SUTURE) ×3 IMPLANT
SUT MON AB 2-0 CT1 27 (SUTURE) ×3 IMPLANT
SUT MON AB 2-0 CT1 36 (SUTURE) ×3 IMPLANT
SUT VIC AB 1 CT1 27 (SUTURE) ×3
SUT VIC AB 1 CT1 27XBRD ANBCTR (SUTURE) ×1 IMPLANT
SUTURE FIBERWR #2 38 T-5 BLUE (SUTURE) ×3 IMPLANT
TOWEL OR 17X24 6PK STRL BLUE (TOWEL DISPOSABLE) ×3 IMPLANT
TOWEL OR 17X26 10 PK STRL BLUE (TOWEL DISPOSABLE) ×3 IMPLANT
WATER STERILE IRR 1000ML POUR (IV SOLUTION) ×1 IMPLANT

## 2017-05-03 NOTE — Anesthesia Preprocedure Evaluation (Signed)
Anesthesia Evaluation  Patient identified by MRN, date of birth, ID band Patient awake    Reviewed: Allergy & Precautions, NPO status , Patient's Chart, lab work & pertinent test results  Airway Mallampati: II  TM Distance: >3 FB Neck ROM: Full    Dental  (+) Teeth Intact, Dental Advisory Given   Pulmonary    breath sounds clear to auscultation       Cardiovascular  Rhythm:Regular Rate:Normal     Neuro/Psych    GI/Hepatic   Endo/Other    Renal/GU      Musculoskeletal   Abdominal   Peds  Hematology   Anesthesia Other Findings   Reproductive/Obstetrics                             Anesthesia Physical Anesthesia Plan  ASA: II  Anesthesia Plan: General   Post-op Pain Management:  Regional for Post-op pain   Induction: Intravenous  PONV Risk Score and Plan: 1 and Ondansetron and Dexamethasone  Airway Management Planned: Oral ETT  Additional Equipment:   Intra-op Plan:   Post-operative Plan: Extubation in OR  Informed Consent: I have reviewed the patients History and Physical, chart, labs and discussed the procedure including the risks, benefits and alternatives for the proposed anesthesia with the patient or authorized representative who has indicated his/her understanding and acceptance.   Dental advisory given  Plan Discussed with: CRNA and Anesthesiologist  Anesthesia Plan Comments:         Anesthesia Quick Evaluation

## 2017-05-03 NOTE — Op Note (Signed)
05/03/2017  3:22 PM  PATIENT:   Shawn Bell  76 y.o. male  PRE-OPERATIVE DIAGNOSIS:  Left shoulder osteoarthritis and rotator cuff dysfunction  POST-OPERATIVE DIAGNOSIS:  same  PROCEDURE:  L RSA #8 stem, +6 spacer, +3 poly, 39/+4 glenosphere, small baseplate  SURGEON:  Dorien Mayotte, Metta Clines M.D.  ASSISTANTS: [place]ord pac   ANESTHESIA:   GET + ISB  EBL: 200  SPECIMEN:  none  Drains: none   PATIENT DISPOSITION:  PACU - hemodynamically stable.    PLAN OF CARE: Admit for overnight observation  Dictation# [id number]   Contact # [phone]

## 2017-05-03 NOTE — H&P (Signed)
Shawn Bell    Chief Complaint: Left shoulder osteoarthritis  HPI: The patient is a 76 y.o. male with end stage left shoulder rotator cuff tear arthropathy  Past Medical History:  Diagnosis Date  . Arthritis   . Cancer ([place])   . Diverticulosis of colon (without mention of hemorrhage)   . Hemorrhoids   . History of prostate cancer    S/P RADIATION [place]  . Hyperlipemia   . Intermittent self-catheterization of bladder   . Personal history of colonic polyps 01/23/2003    Past Surgical History:  Procedure Laterality Date  . CATARACT EXTRACTION W/ INTRAOCULAR LENS  IMPLANT, BILATERAL    . CHOLECYSTECTOMY  02/07/2012   Procedure: LAPAROSCOPIC CHOLECYSTECTOMY WITH INTRAOPERATIVE CHOLANGIOGRAM;  Surgeon: Earnstine Regal, MD;  Location: [facility]S;  Service: General;  Laterality: N/A;  . JOINT REPLACEMENT    . SHOULDER ARTHROSCOPY     BILATERAL  . TOTAL HIP ARTHROPLASTY     right  . TOTAL HIP ARTHROPLASTY  03/06/2012   Procedure: TOTAL HIP ARTHROPLASTY;  Surgeon: Gearlean Alf, MD;  Location: [facility] ORS;  Service: Orthopedics;  Laterality: Left;    Family History  Problem Relation Age of Onset  . Heart failure Mother   . Colon cancer Neg Hx   . Colon polyps Neg Hx   . Rectal cancer Neg Hx   . Stomach cancer Neg Hx     Social History:  reports that  has never smoked. he has never used smokeless tobacco. He reports that he drinks alcohol. He reports that he does not use drugs.   Medications Prior to Admission  Medication Sig Dispense Refill  . acetaminophen (TYLENOL) 325 MG tablet Take 325 mg every 6 (six) hours as needed by mouth for moderate pain or headache.    Marland Kitchen aspirin 81 MG tablet Take 81 mg by mouth daily.    . Coenzyme Q10 (CO Q-10 PO) Take 1 capsule daily by mouth.     . Multiple Vitamins-Minerals (CENTRUM SILVER ADULT 50+ PO) Take 1 tablet daily by mouth.     . rosuvastatin (CRESTOR) 5 MG tablet Take 1 tablet (5 mg total) by mouth daily. Needs office visit and labs-2nd notice  (Patient taking differently: Take 5 mg daily by mouth. ) 30 tablet 0  . zolpidem (AMBIEN) 10 MG tablet Take 5 mg at bedtime as needed by mouth for sleep.       Physical Exam: left shoulder with profoundly restricted motion as noted at recent office visits  Vitals  Temp:  [98 F (36.7 C)] 98 F (36.7 C) (11/15 1041) Pulse Rate:  [61] 61 (11/15 1041) Resp:  [20] 20 (11/15 1041) BP: (120-139)/(67-73) 133/69 (11/15 1215) SpO2:  [96 %] 96 % (11/15 1041)  Assessment/Plan  Impression: Left shoulder osteoarthritis   Plan of Action: Procedure(s): REVERSE LEFT SHOULDER ARTHROPLASTY  Shawn Bell 05/03/2017, 12:34 PM Contact # [phone]

## 2017-05-03 NOTE — Anesthesia Procedure Notes (Signed)
Anesthesia Regional Block: Interscalene brachial plexus block   Pre-Anesthetic Checklist: ,, timeout performed, Correct Patient, Correct Site, Correct Laterality, Correct Procedure, Correct Position, site marked, Risks and benefits discussed,  Surgical consent,  Pre-op evaluation,  At surgeon's request and post-op pain management  Laterality: Left  Prep: chloraprep       Needles:  Injection technique: Single-shot  Needle Type: Echogenic Stimulator Needle     Needle Length: 9cm  Needle Gauge: 21     Additional Needles:   Procedures:,,,, ultrasound used (permanent image in chart),,,,  Narrative:  Start time: 05/03/2017 11:55 AM End time: 05/03/2017 12:00 PM Injection made incrementally with aspirations every 5 mL.  Performed by: Personally   Additional Notes: 25 cc 0.5% Bupivacaine  1:200 Epi injected easily

## 2017-05-03 NOTE — Discharge Instructions (Signed)

## 2017-05-03 NOTE — Anesthesia Postprocedure Evaluation (Signed)
Anesthesia Post Note  Patient: Shawn Bell  Procedure(s) Performed: REVERSE LEFT SHOULDER ARTHROPLASTY (Left Shoulder)     Patient location during evaluation: PACU Anesthesia Type: General Level of consciousness: awake and alert Pain management: pain level controlled Vital Signs Assessment: post-procedure vital signs reviewed and stable Respiratory status: spontaneous breathing, nonlabored ventilation and respiratory function stable Cardiovascular status: blood pressure returned to baseline and stable Postop Assessment: no apparent nausea or vomiting Anesthetic complications: no    Last Vitals:  Vitals:   05/03/17 1531 05/03/17 1600  BP: 124/84 133/90  Pulse: 83 81  Resp: 19 14  Temp: 36.7 C   SpO2: 95% 95%    Last Pain:  Vitals:   05/03/17 1600  TempSrc:   PainSc: 0-No pain                 Lynda Rainwater

## 2017-05-03 NOTE — Anesthesia Procedure Notes (Signed)
Procedure Name: Intubation Date/Time: 05/03/2017 1:30 PM Performed by: Harden Mo, CRNA Pre-anesthesia Checklist: Patient identified, Emergency Drugs available, Suction available, Patient being monitored and Timeout performed Patient Re-evaluated:Patient Re-evaluated prior to induction Oxygen Delivery Method: Circle system utilized Preoxygenation: Pre-oxygenation with 100% oxygen Induction Type: IV induction Ventilation: Mask ventilation without difficulty and Oral airway inserted - appropriate to patient size Laryngoscope Size: Miller and 3 Grade View: Grade I Tube type: Oral Tube size: 7.5 mm Number of attempts: 1 Airway Equipment and Method: Stylet Placement Confirmation: ETT inserted through vocal cords under direct vision,  positive ETCO2 and breath sounds checked- equal and bilateral Secured at: 23 cm Tube secured with: Tape Dental Injury: Teeth and Oropharynx as per pre-operative assessment  Comments: Performed by H. Botts SRNA

## 2017-05-03 NOTE — Transfer of Care (Signed)
Immediate Anesthesia Transfer of Care Note  Patient: Shawn Bell  Procedure(s) Performed: REVERSE LEFT SHOULDER ARTHROPLASTY (Left Shoulder)  Patient Location: PACU  Anesthesia Type:General and Regional  Level of Consciousness: awake and alert   Airway & Oxygen Therapy: Patient Spontanous Breathing and Patient connected to nasal cannula oxygen  Post-op Assessment: Report given to RN, Post -op Vital signs reviewed and stable and Patient moving all extremities X 4  Post vital signs: Reviewed and stable  Last Vitals:  Vitals:   05/03/17 1215 05/03/17 1255  BP: 133/69 124/69  Pulse:    Resp:    Temp:    SpO2:      Last Pain:  Vitals:   05/03/17 1210  TempSrc:   PainSc: 0-No pain      Patients Stated Pain Goal: 3 (34/28/76 8115)  Complications: No apparent anesthesia complications

## 2017-05-04 ENCOUNTER — Encounter (HOSPITAL_COMMUNITY): Payer: Self-pay | Admitting: Orthopedic Surgery

## 2017-05-04 MED ORDER — DIAZEPAM 5 MG PO TABS: 2.5000 mg | ORAL_TABLET | Freq: Four times a day (QID) | ORAL | 1 refills | Status: DC | PRN

## 2017-05-04 MED ORDER — ONDANSETRON HCL 4 MG PO TABS: 4.0000 mg | ORAL_TABLET | Freq: Three times a day (TID) | ORAL | 0 refills | Status: DC | PRN

## 2017-05-04 MED ORDER — OXYCODONE HCL 5 MG PO TABS: 5.0000 mg | ORAL_TABLET | ORAL | 0 refills | Status: DC | PRN

## 2017-05-04 NOTE — Discharge Summary (Signed)
PATIENT ID:      Shawn Bell  MRN:     [id number] DOB/AGE:    1940-11-03 / 76 y.o.     DISCHARGE SUMMARY  ADMISSION DATE:    05/03/2017 DISCHARGE DATE:    ADMISSION DIAGNOSIS: Left shoulder osteoarthritis  Past Medical History:  Diagnosis Date  . Arthritis   . Cancer ([place])   . Diverticulosis of colon (without mention of hemorrhage)   . Hemorrhoids   . History of prostate cancer    S/P RADIATION [place]  . Hyperlipemia   . Intermittent self-catheterization of bladder   . Personal history of colonic polyps 01/23/2003    DISCHARGE DIAGNOSIS:   Active Problems:   S/p reverse total shoulder arthroplasty   PROCEDURE: Procedure(s): REVERSE LEFT SHOULDER ARTHROPLASTY on 05/03/2017  CONSULTS:    HISTORY:  See H&P in chart.  HOSPITAL COURSE:  SLATE DEBROUX is a 76 y.o. admitted on 05/03/2017 with a diagnosis of Left shoulder osteoarthritis .  They were brought to the operating room on 05/03/2017 and underwent Procedure(s): REVERSE LEFT SHOULDER ARTHROPLASTY.    They were given perioperative antibiotics:  Anti-infectives (From admission, onward)   Start     Dose/Rate Route Frequency Ordered Stop   05/03/17 1930  ceFAZolin (ANCEF) IVPB 2g/100 mL premix     2 g 200 mL/hr over 30 Minutes Intravenous Every 6 hours 05/03/17 1706 05/04/17 1329   05/03/17 1025  ceFAZolin (ANCEF) IVPB 2g/100 mL premix     2 g 200 mL/hr over 30 Minutes Intravenous On call to O.R. 05/03/17 1025 05/03/17 1337    .  Patient underwent the above named procedure and tolerated it well. The following day they were hemodynamically stable and pain was controlled on oral analgesics. They were neurovascularly intact to the operative extremity. OT was ordered and worked with patient per protocol. They were medically and orthopaedically stable for discharge on day 1 .    DIAGNOSTIC STUDIES:  RECENT RADIOGRAPHIC STUDIES :  No results found.  RECENT VITAL SIGNS:   Patient Vitals for the past 24 hrs:  BP Temp Temp  src Pulse Resp SpO2 Height Weight  05/04/17 0529 108/63 97.7 F (36.5 C) Oral 81 16 94 % - -  05/03/17 2050 124/77 98.2 F (36.8 C) Oral 89 16 97 % - -  05/03/17 1656 134/83 (!) 97.5 F (36.4 C) Oral 85 18 98 % 6' 0.99" (1.854 m) 97 kg (213 lb 13.5 oz)  05/03/17 1635 - 97.6 F (36.4 C) - 76 19 98 % - -  05/03/17 1630 (!) 139/92 - - 80 (!) 24 97 % - -  05/03/17 1615 136/88 - - 78 (!) 21 99 % - -  05/03/17 1614 106/87 - - 78 19 96 % - -  05/03/17 1600 133/90 - - 81 14 95 % - -  05/03/17 1531 124/84 98 F (36.7 C) - 83 19 95 % - -  05/03/17 1255 124/69 - - - - - - -  05/03/17 1215 133/69 - - - - - - -  05/03/17 1210 132/67 - - - - - - -  05/03/17 1205 139/73 - - - - - - -  05/03/17 1041 120/72 98 F (36.7 C) Oral 61 20 96 % - -  .  RECENT EKG RESULTS:    Orders placed or performed during the hospital encounter of 07/03/15  . EKG 12-Lead  . EKG 12-Lead  . EKG    DISCHARGE INSTRUCTIONS:    DISCHARGE  MEDICATIONS:   Allergies as of 05/04/2017   No Known Allergies     Medication List    TAKE these medications   acetaminophen 325 MG tablet Commonly known as:  TYLENOL Take 325 mg every 6 (six) hours as needed by mouth for moderate pain or headache.   aspirin 81 MG tablet Take 81 mg by mouth daily.   CENTRUM SILVER ADULT 50+ PO Take 1 tablet daily by mouth.   CO Q-10 PO Take 1 capsule daily by mouth.   diazepam 5 MG tablet Commonly known as:  VALIUM Take 0.5-1 tablets (2.5-5 mg total) every 6 (six) hours as needed by mouth for muscle spasms or sedation.   ondansetron 4 MG tablet Commonly known as:  ZOFRAN Take 1 tablet (4 mg total) every 8 (eight) hours as needed by mouth for nausea or vomiting.   oxyCODONE 5 MG immediate release tablet Commonly known as:  Oxy IR/ROXICODONE Take 1-2 tablets (5-10 mg total) every 4 (four) hours as needed by mouth for moderate pain ((score 4 to 6)).   rosuvastatin 5 MG tablet Commonly known as:  CRESTOR Take 1 tablet (5 mg  total) by mouth daily. Needs office visit and labs-2nd notice What changed:  additional instructions   zolpidem 10 MG tablet Commonly known as:  AMBIEN Take 5 mg at bedtime as needed by mouth for sleep.       FOLLOW UP VISIT:   Follow-up Information    Justice Britain, MD.   Specialty:  Orthopedic Surgery Why:  call to be seen in 10-14 days Contact information: [address] [place] [address] [phone]           DISCHARGE TO: Home   DISCHARGE CONDITION:  Festus Barren for Dr. Justice Britain 05/04/2017, 8:18 AM

## 2017-05-04 NOTE — Evaluation (Signed)
Occupational Therapy Evaluation and Discharge Patient Details Name: Shawn Bell MRN: [id number] DOB: 1940/11/04 Today's Date: 05/04/2017    History of Present Illness REVERSE LEFT SHOULDER ARTHROPLASTY   Clinical Impression   This 76 yo male admitted and underwent above presents to acute OT with all OT education completed, we will D/C from acute OT.    Follow Up Recommendations  No OT follow up    Equipment Recommendations  None recommended by OT       Precautions / Restrictions Precautions Precautions: Shoulder Type of Shoulder Precautions: If in controlled enviroment can come out of sling to give neck a break. Sleep in sling. Can use operative arm fo eating, hygiene, ADLs (ER 20, ABD 45, FF 60); Pendulums, lap slides. NO resisted internal rotation or exercies for internal rotation Shoulder Interventions: Shoulder sling/immobilizer;Off for dressing/bathing/exercises Restrictions Weight Bearing Restrictions: Yes LUE Weight Bearing: Non weight bearing      Mobility Bed Mobility Overal bed mobility: Independent                Transfers Overall transfer level: Independent                        ADL either performed or assessed with clinical judgement         Vision Patient Visual Report: No change from baseline              Pertinent Vitals/Pain Pain Assessment: No/denies pain     Hand Dominance Right   Extremity/Trunk Assessment Upper Extremity Assessment Upper Extremity Assessment: LUE deficits/detail LUE Deficits / Details: shoulder sx this admission; elbow flexion limited but rest WNL LUE Coordination: decreased gross motor           Communication Communication Communication: No difficulties   Cognition Arousal/Alertness: Awake/alert Behavior During Therapy: WFL for tasks assessed/performed Overall Cognitive Status: Within Functional Limits for tasks assessed                                        Exercises  Other Exercises Other Exercises: Pt performed 10 reps of all 4 pendulums; Also performed lap slides, elbow flexion (had to walk fingers up his chest/right arm), forearm supination/pronation, and composite hand open/close. Pt and wife aware he is to do these exercises 5x/day 10-15 reps each (handouts given)   Shoulder Instructions Shoulder Instructions Donning/doffing shirt without moving shoulder: Patient able to independently direct caregiver Method for sponge bathing under operated UE: Patient able to independently direct caregiver Donning/doffing sling/immobilizer: Caregiver independent with task Correct positioning of sling/immobilizer: Caregiver independent with task Pendulum exercises (written home exercise program): Supervision/safety ROM for elbow, wrist and digits of operated UE: Supervision/safety Sling wearing schedule (on at all times/off for ADL's): Independent Proper positioning of operated UE when showering: Independent Dressing change: (NA) Positioning of UE while sleeping: Patient able to independently direct caregiver    Home Living Family/patient expects to be discharged to:: Private residence Living Arrangements: Spouse/significant other Available Help at Discharge: Family;Available 24 hours/day Type of Home: House Home Access: Stairs to enter;Elevator           Bathroom Shower/Tub: Hospital doctor Toilet: Handicapped height     Home Equipment: Shower seat - built in          Prior Functioning/Environment Level of Independence: Independent        Comments: Very Active, owns multiple  properties/businesses in [place]        OT Problem List: Decreased strength;Decreased range of motion         OT Goals(Current goals can be found in the care plan section) Acute Rehab OT Goals Patient Stated Goal: home today  OT Frequency:                AM-PAC PT "6 Clicks" Daily Activity     Outcome Measure Help from another person eating  meals?: A Little Help from another person taking care of personal grooming?: A Little Help from another person toileting, which includes using toliet, bedpan, or urinal?: A Little Help from another person bathing (including washing, rinsing, drying)?: A Little Help from another person to put on and taking off regular upper body clothing?: A Little Help from another person to put on and taking off regular lower body clothing?: A Little 6 Click Score: 18   End of Session Equipment Utilized During Treatment: (sling) Nurse Communication: (Pt ready to go from therapy standpoint)  Activity Tolerance: Patient tolerated treatment well Patient left: with family/visitor present(sitting EOB)                   Time: 6967-8938 OT Time Calculation (min): 62 min Charges:  OT General Charges $OT Visit: 1 Visit OT Evaluation $OT Eval Moderate Complexity: 1 Mod OT Treatments $Self Care/Home Management : 8-22 mins $Therapeutic Exercise: 23-37 mins Golden Circle, OTR/L [phone] 05/04/2017

## 2017-05-04 NOTE — Progress Notes (Signed)
Patient was discharged home accompanied by his wife with no apparent distress noted. Medications and discharge instructions explained to the patient and his wife. They verbalized understanding. Copies given to the patient including original prescriptions. IV saline lock removed.

## 2017-05-04 NOTE — Op Note (Signed)
NAME:  VERNON, MAISH                     ACCOUNT NO.:  MEDICAL RECORD NO.:  [id number]  LOCATION:                                 FACILITY:  PHYSICIAN:  Metta Clines. Tamey Wanek, M.D.       DATE OF BIRTH:  DATE OF PROCEDURE:  05/03/2017 DATE OF DISCHARGE:                              OPERATIVE REPORT   PREOPERATIVE DIAGNOSIS:  End-stage left shoulder osteoarthritis with associated rotator cuff dysfunction and likely rotator cuff tear arthropathy.  POSTOPERATIVE DIAGNOSIS:  Left shoulder end-stage arthritis with rotator cuff tear arthropathy.  PROCEDURE:  Left reverse shoulder arthroplasty utilizing a press-fit size 8 Arthrex stem with a +6 spacer, +3 poly, and a 39 +4 glenosphere on a small baseplate.  SURGEON:  Metta Clines. Jaziel Bennett, M.D.  Terrence DupontOlivia Mackie A. Shuford, P.A.-C.  ANESTHESIA:  General endotracheal as well as interscalene block.  ESTIMATED BLOOD LOSS:  200 mL.  DRAINS:  None.  HISTORY:  Mr. Hassey is a 76 year old gentleman, who has had chronic and progressively increasing bilateral shoulder pain left more symptomatic than right with examination showing profound restrictions in mobility and severe pain.  His radiographs show complete obliteration of joint space bilaterally with marked bony deformity, large peripheral osteophytes, and high-riding humeral heads, all consistent with rotator cuff tear arthropathy with underlying arthritis.  His left shoulder is most symptomatic and he is brought to the operating room, at this time for planned left reverse shoulder arthroplasty as described below.  Preoperatively, I counseled Mr. Lina regarding treatment options and potential risks versus benefits thereof.  Possible surgical complications were all reviewed including bleeding, infection, vascular injury, persistent pain, loss of motion, anesthetic complication, and possible need for additional surgery.  He understands and accepts and agrees with our planned  procedure.  PROCEDURE IN DETAIL:  After undergoing routine preop evaluation, the patient received prophylactic antibiotics and an interscalene block was established in holding area by the Anesthesia Department.  Placed supine on the operating table.  Underwent smooth induction of a general endotracheal anesthesia.  Placed in a beach chair position and appropriately padded and protected.  Left shoulder girdle region was sterilely prepped and draped in standard fashion.  Time-out was called. A deltopectoral approach to the left shoulder was made through an 8 cm anterior incision.  Skin flaps were elevated, dissection carried deeply, electrocautery was used for hemostasis.  The deltopectoral interval was then developed from proximal to the distal with vein taken laterally. The upper centimeter and half of the pec major tendon was then tenotomized to enhance exposure.  Conjoint tendon mobilized, retracted medially, adhesions divided in deltoid.  The long head biceps tendon was then tenodesed at the upper border of the pec major and was then tenotomized and proximal segment was excised.  We then divided the subscapularis away from its attachment to the lesser tuberosity and the free margin was then tagged with pair of #2 FiberWire grasping sutures. We then divided the capsular attachments from the anterior-inferior and inferior aspect of the humeral neck, which was technically challenging due to the very large overgrowth of osteophytes, and we did perform a release in subperiosteal fashion allowing [place]  to deliver the head through the wound completely.  Once the humeral head was exposed, we outlined our proposed humeral head resection with our extramedullary guide, and the humeral head resection was then completed with an oscillating saw. We then removed the osteophytes from the anterior and inferior aspects of the humeral neck and a number of large loose bodies were also evacuated from the  joint space and axillary pouch.  At this point, a metal cap was placed over the cut surface of the proximal humerus.  We exposed the glenoid with combination of Fukuda, pitchfork, and snake tongue retractors.  A guide pin was placed into the center of the glenoid after the labrum was excised in its entirety.  Once we had complete visualization of the periphery of the glenoid.  We then placed a guide pin in the center of the glenoid.  We did perform a correction of the moderate glenoid retroversion gaining a neutral alignment, and we did perform central and peripheral reaming of the glenoid.  We then impacted our small glenoid base plate.  It was then transfixed with a central lag screw and inferior and superior locking screws, all of which obtained excellent bony purchase and fixation.  I then used the hand reamers to prepare the periphery of the glenoid and removed the large osteophytes at the inferior and superior aspects of the glenoid.  Once this was completed, the joint was irrigated, all debris was removed.  We then selected our 39 +4 glenosphere and this was inserted onto the small base plate and impacted showing good stability.  At this point, we then returned our attention to the proximal humerus, where we prepared the humeral canal, hand reaming up to size 7 and broached up to size 8 with excellent fit.  We then reamed the proximal metaphyseal region with a standard central reamer.  We then performed a trial implantation showing good soft tissue balance.  At this point, the final implant was assembled on the back table, and then impacted into position.  We then once again performed a series of trial reductions and ultimately felt that the total plus line from the stem was appropriate, so final construct was a +6 spacer, +3 poly.  Final reduction was then performed and excellent soft tissue balance and good stability was achieved.  We then copiously irrigated the joint, final  hemostasis was obtained.  The deltopectoral interval was then reapproximated with a series of figure- of-eight #1 Vicryl sutures.  2-0 Vicryl was used for subcu layer, intracuticular 3-0 Monocryl for the skin, followed by Dermabond and Aquacel dressing.  Left arm was placed into a sling.  The patient was awakened, extubated, and taken to the recovery room in stable condition.  Jenetta Loges, P.A.-C, was used as an Environmental consultant throughout this case, essential for help with positioning the patient, positioning the extremity, retraction implantation of prosthesis, wound closure, and intraoperative decision making.     Metta Clines. Henryk Ursin, M.D.     KMS/MEDQ  D:  05/03/2017  T:  05/03/2017  Job:  [id number]

## 2017-05-08 DIAGNOSIS — Z96612 Presence of left artificial shoulder joint: Secondary | ICD-10-CM | POA: Diagnosis not present

## 2017-05-08 DIAGNOSIS — Z471 Aftercare following joint replacement surgery: Secondary | ICD-10-CM | POA: Diagnosis not present

## 2017-05-17 DIAGNOSIS — M19011 Primary osteoarthritis, right shoulder: Secondary | ICD-10-CM | POA: Diagnosis not present

## 2017-05-17 DIAGNOSIS — M19012 Primary osteoarthritis, left shoulder: Secondary | ICD-10-CM | POA: Diagnosis not present

## 2017-05-21 DIAGNOSIS — M19011 Primary osteoarthritis, right shoulder: Secondary | ICD-10-CM | POA: Diagnosis not present

## 2017-05-21 DIAGNOSIS — M19012 Primary osteoarthritis, left shoulder: Secondary | ICD-10-CM | POA: Diagnosis not present

## 2017-05-23 DIAGNOSIS — M19011 Primary osteoarthritis, right shoulder: Secondary | ICD-10-CM | POA: Diagnosis not present

## 2017-05-23 DIAGNOSIS — M19012 Primary osteoarthritis, left shoulder: Secondary | ICD-10-CM | POA: Diagnosis not present

## 2017-05-29 DIAGNOSIS — M19012 Primary osteoarthritis, left shoulder: Secondary | ICD-10-CM | POA: Diagnosis not present

## 2017-05-29 DIAGNOSIS — M19011 Primary osteoarthritis, right shoulder: Secondary | ICD-10-CM | POA: Diagnosis not present

## 2017-05-31 DIAGNOSIS — M19012 Primary osteoarthritis, left shoulder: Secondary | ICD-10-CM | POA: Diagnosis not present

## 2017-05-31 DIAGNOSIS — M19011 Primary osteoarthritis, right shoulder: Secondary | ICD-10-CM | POA: Diagnosis not present

## 2017-06-05 DIAGNOSIS — M19012 Primary osteoarthritis, left shoulder: Secondary | ICD-10-CM | POA: Diagnosis not present

## 2017-06-05 DIAGNOSIS — M19011 Primary osteoarthritis, right shoulder: Secondary | ICD-10-CM | POA: Diagnosis not present

## 2017-06-07 DIAGNOSIS — M19012 Primary osteoarthritis, left shoulder: Secondary | ICD-10-CM | POA: Diagnosis not present

## 2017-06-07 DIAGNOSIS — M19011 Primary osteoarthritis, right shoulder: Secondary | ICD-10-CM | POA: Diagnosis not present

## 2017-06-21 DIAGNOSIS — M19011 Primary osteoarthritis, right shoulder: Secondary | ICD-10-CM | POA: Diagnosis not present

## 2017-06-21 DIAGNOSIS — M19012 Primary osteoarthritis, left shoulder: Secondary | ICD-10-CM | POA: Diagnosis not present

## 2017-06-22 DIAGNOSIS — C61 Malignant neoplasm of prostate: Secondary | ICD-10-CM | POA: Diagnosis not present

## 2017-06-22 DIAGNOSIS — R21 Rash and other nonspecific skin eruption: Secondary | ICD-10-CM | POA: Diagnosis not present

## 2017-06-22 DIAGNOSIS — R339 Retention of urine, unspecified: Secondary | ICD-10-CM | POA: Diagnosis not present

## 2017-06-26 DIAGNOSIS — M19011 Primary osteoarthritis, right shoulder: Secondary | ICD-10-CM | POA: Diagnosis not present

## 2017-06-26 DIAGNOSIS — M19012 Primary osteoarthritis, left shoulder: Secondary | ICD-10-CM | POA: Diagnosis not present

## 2017-06-28 DIAGNOSIS — M19011 Primary osteoarthritis, right shoulder: Secondary | ICD-10-CM | POA: Diagnosis not present

## 2017-06-28 DIAGNOSIS — M19012 Primary osteoarthritis, left shoulder: Secondary | ICD-10-CM | POA: Diagnosis not present

## 2017-07-10 DIAGNOSIS — M19012 Primary osteoarthritis, left shoulder: Secondary | ICD-10-CM | POA: Diagnosis not present

## 2017-07-10 DIAGNOSIS — M19011 Primary osteoarthritis, right shoulder: Secondary | ICD-10-CM | POA: Diagnosis not present

## 2017-07-12 DIAGNOSIS — M19011 Primary osteoarthritis, right shoulder: Secondary | ICD-10-CM | POA: Diagnosis not present

## 2017-07-12 DIAGNOSIS — M19012 Primary osteoarthritis, left shoulder: Secondary | ICD-10-CM | POA: Diagnosis not present

## 2017-07-13 DIAGNOSIS — Z471 Aftercare following joint replacement surgery: Secondary | ICD-10-CM | POA: Diagnosis not present

## 2017-07-13 DIAGNOSIS — Z96612 Presence of left artificial shoulder joint: Secondary | ICD-10-CM | POA: Diagnosis not present

## 2017-07-17 DIAGNOSIS — M19012 Primary osteoarthritis, left shoulder: Secondary | ICD-10-CM | POA: Diagnosis not present

## 2017-07-17 DIAGNOSIS — M19011 Primary osteoarthritis, right shoulder: Secondary | ICD-10-CM | POA: Diagnosis not present

## 2017-07-26 DIAGNOSIS — M19011 Primary osteoarthritis, right shoulder: Secondary | ICD-10-CM | POA: Diagnosis not present

## 2017-07-26 DIAGNOSIS — M19012 Primary osteoarthritis, left shoulder: Secondary | ICD-10-CM | POA: Diagnosis not present

## 2017-07-31 DIAGNOSIS — M19012 Primary osteoarthritis, left shoulder: Secondary | ICD-10-CM | POA: Diagnosis not present

## 2017-07-31 DIAGNOSIS — M19011 Primary osteoarthritis, right shoulder: Secondary | ICD-10-CM | POA: Diagnosis not present

## 2017-08-02 DIAGNOSIS — M19012 Primary osteoarthritis, left shoulder: Secondary | ICD-10-CM | POA: Diagnosis not present

## 2017-08-02 DIAGNOSIS — M19011 Primary osteoarthritis, right shoulder: Secondary | ICD-10-CM | POA: Diagnosis not present

## 2017-08-07 DIAGNOSIS — M19011 Primary osteoarthritis, right shoulder: Secondary | ICD-10-CM | POA: Diagnosis not present

## 2017-08-07 DIAGNOSIS — M19012 Primary osteoarthritis, left shoulder: Secondary | ICD-10-CM | POA: Diagnosis not present

## 2017-08-09 DIAGNOSIS — M19012 Primary osteoarthritis, left shoulder: Secondary | ICD-10-CM | POA: Diagnosis not present

## 2017-08-09 DIAGNOSIS — M19011 Primary osteoarthritis, right shoulder: Secondary | ICD-10-CM | POA: Diagnosis not present

## 2017-08-14 DIAGNOSIS — M19012 Primary osteoarthritis, left shoulder: Secondary | ICD-10-CM | POA: Diagnosis not present

## 2017-08-14 DIAGNOSIS — M19011 Primary osteoarthritis, right shoulder: Secondary | ICD-10-CM | POA: Diagnosis not present

## 2017-08-15 DIAGNOSIS — M19011 Primary osteoarthritis, right shoulder: Secondary | ICD-10-CM | POA: Diagnosis not present

## 2017-08-15 DIAGNOSIS — Z96611 Presence of right artificial shoulder joint: Secondary | ICD-10-CM | POA: Diagnosis not present

## 2017-08-15 DIAGNOSIS — Z471 Aftercare following joint replacement surgery: Secondary | ICD-10-CM | POA: Diagnosis not present

## 2017-09-14 NOTE — Pre-Procedure Instructions (Signed)
Shawn Bell  09/14/2017      [facility] [phone] - [address], [address] [place] [address] Phone: [phone] Fax: [phone]    Your procedure is scheduled on Thursday April 11.  Report to [facility] Admitting at 8:30 A.M.  Call this number if you have problems the morning of surgery:  [phone]   Remember:  Do not eat food or drink liquids after midnight.  Take these medicines the morning of surgery with A SIP OF WATER: Acetaminophen (tylenol) if needed  7 days prior to surgery STOP taking any Aspirin(unless otherwise instructed by your surgeon), Aleve, Naproxen, Ibuprofen, Motrin, Advil, Goody's, BC's, all herbal medications, fish oil, and all vitamins     Do not wear jewelry, make-up or nail polish.  Do not wear lotions, powders, or perfumes, or deodorant.  Do not shave 48 hours prior to surgery.  Men may shave face and neck.  Do not bring valuables to the hospital.  [facility] is not responsible for any belongings or valuables.  Contacts, dentures or bridgework may not be worn into surgery.  Leave your suitcase in the car.  After surgery it may be brought to your room.  For patients admitted to the hospital, discharge time will be determined by your treatment team.  Patients discharged the day of surgery will not be allowed to drive home.   Special instructions:    [facility]- Preparing For Surgery  Before surgery, you can play an important role. Because skin is not sterile, your skin needs to be as free of germs as possible. You can reduce the number of germs on your skin by washing with CHG (chlorahexidine gluconate) Soap before surgery.  CHG is an antiseptic cleaner which kills germs and bonds with the skin to continue killing germs even after washing.  Please do not use if you have an allergy to CHG or antibacterial soaps. If your skin becomes reddened/irritated  stop using the CHG.  Do not shave (including legs and underarms) for at least 48 hours prior to first CHG shower. It is OK to shave your face.  Please follow these instructions carefully.   1. Shower the NIGHT BEFORE SURGERY and the MORNING OF SURGERY with CHG.   2. If you chose to wash your hair, wash your hair first as usual with your normal shampoo.  3. After you shampoo, rinse your hair and body thoroughly to remove the shampoo.  4. Use CHG as you would any other liquid soap. You can apply CHG directly to the skin and wash gently with a scrungie or a clean washcloth.   5. Apply the CHG Soap to your body ONLY FROM THE NECK DOWN.  Do not use on open wounds or open sores. Avoid contact with your eyes, ears, mouth and genitals (private parts). Wash Face and genitals (private parts)  with your normal soap.  6. Wash thoroughly, paying special attention to the area where your surgery will be performed.  7. Thoroughly rinse your body with warm water from the neck down.  8. DO NOT shower/wash with your normal soap after using and rinsing off the CHG Soap.  9. Pat yourself dry with a CLEAN TOWEL.  10. Wear CLEAN PAJAMAS to bed the night before surgery, wear comfortable clothes the morning of surgery  11. Place CLEAN SHEETS on your bed the night of your first shower and  DO NOT SLEEP WITH PETS.    Day of Surgery: Do not apply any deodorants/lotions. Please wear clean clothes to the hospital/surgery center.      Please read over the following fact sheets that you were given. Coughing and Deep Breathing, MRSA Information and Surgical Site Infection Prevention

## 2017-09-17 ENCOUNTER — Encounter (HOSPITAL_COMMUNITY): Payer: Self-pay

## 2017-09-17 ENCOUNTER — Encounter (HOSPITAL_COMMUNITY)
Admission: RE | Admit: 2017-09-17 | Discharge: 2017-09-17 | Disposition: A | Discharge status: Home / Self Care | Payer: BLUE CROSS/BLUE SHIELD | Source: Ambulatory Visit | Attending: Orthopedic Surgery | Admitting: Orthopedic Surgery

## 2017-09-17 DIAGNOSIS — Z01812 Encounter for preprocedural laboratory examination: Secondary | ICD-10-CM | POA: Diagnosis not present

## 2017-09-17 LAB — SURGICAL PCR SCREEN
MRSA, PCR: NEGATIVE
STAPHYLOCOCCUS AUREUS: NEGATIVE

## 2017-09-17 LAB — CBC
HEMATOCRIT: 47.4 % (ref 39.0–52.0)
HEMOGLOBIN: 16 g/dL (ref 13.0–17.0)
MCH: 32 pg (ref 26.0–34.0)
MCHC: 33.8 g/dL (ref 30.0–36.0)
MCV: 94.8 fL (ref 78.0–100.0)
Platelets: 250 10*3/uL (ref 150–400)
RBC: 5 MIL/uL (ref 4.22–5.81)
RDW: 13.1 % (ref 11.5–15.5)
WBC: 6.7 10*3/uL (ref 4.0–10.5)

## 2017-09-17 NOTE — Progress Notes (Signed)
PCP:  Dr.Myra Hammrick  @ [facility] [facility]  [place]  Pt.  Will stop aspirin 1 week prior to surgery

## 2017-09-26 MED ORDER — SODIUM CHLORIDE 0.9 % IV SOLN
1000.0000 mg | INTRAVENOUS | Status: AC
  Administered 2017-09-27: 1000 mg via INTRAVENOUS
  Filled 2017-09-26: qty 1100

## 2017-09-26 MED ORDER — CEFAZOLIN SODIUM-DEXTROSE 2-4 GM/100ML-% IV SOLN
2.0000 g | INTRAVENOUS | Status: AC
  Administered 2017-09-27: 2 g via INTRAVENOUS
  Filled 2017-09-26: qty 100

## 2017-09-27 ENCOUNTER — Encounter (HOSPITAL_COMMUNITY): Payer: Self-pay | Admitting: Anesthesiology

## 2017-09-27 ENCOUNTER — Encounter (HOSPITAL_COMMUNITY)
Admission: RE | Disposition: A | Discharge status: Home / Self Care | Payer: Self-pay | Source: Ambulatory Visit | Attending: Orthopedic Surgery

## 2017-09-27 ENCOUNTER — Inpatient Hospital Stay (HOSPITAL_COMMUNITY): Payer: BLUE CROSS/BLUE SHIELD | Admitting: Anesthesiology

## 2017-09-27 ENCOUNTER — Inpatient Hospital Stay (HOSPITAL_COMMUNITY)
Admission: RE | Admit: 2017-09-27 | Discharge: 2017-09-28 | DRG: 483 | Disposition: A | Discharge status: Home / Self Care | Payer: BLUE CROSS/BLUE SHIELD | Source: Ambulatory Visit | Attending: Orthopedic Surgery | Admitting: Orthopedic Surgery

## 2017-09-27 DIAGNOSIS — Z9842 Cataract extraction status, left eye: Secondary | ICD-10-CM

## 2017-09-27 DIAGNOSIS — Z96641 Presence of right artificial hip joint: Secondary | ICD-10-CM | POA: Diagnosis present

## 2017-09-27 DIAGNOSIS — M19011 Primary osteoarthritis, right shoulder: Secondary | ICD-10-CM | POA: Diagnosis not present

## 2017-09-27 DIAGNOSIS — Z8546 Personal history of malignant neoplasm of prostate: Secondary | ICD-10-CM | POA: Diagnosis not present

## 2017-09-27 DIAGNOSIS — Z923 Personal history of irradiation: Secondary | ICD-10-CM | POA: Diagnosis not present

## 2017-09-27 DIAGNOSIS — K5730 Diverticulosis of large intestine without perforation or abscess without bleeding: Secondary | ICD-10-CM | POA: Diagnosis not present

## 2017-09-27 DIAGNOSIS — Y658 Other specified misadventures during surgical and medical care: Secondary | ICD-10-CM | POA: Diagnosis not present

## 2017-09-27 DIAGNOSIS — Y92234 Operating room of hospital as the place of occurrence of the external cause: Secondary | ICD-10-CM | POA: Diagnosis not present

## 2017-09-27 DIAGNOSIS — Z961 Presence of intraocular lens: Secondary | ICD-10-CM | POA: Diagnosis not present

## 2017-09-27 DIAGNOSIS — Z9049 Acquired absence of other specified parts of digestive tract: Secondary | ICD-10-CM

## 2017-09-27 DIAGNOSIS — G8918 Other acute postprocedural pain: Secondary | ICD-10-CM | POA: Diagnosis not present

## 2017-09-27 DIAGNOSIS — T888XXA Other specified complications of surgical and medical care, not elsewhere classified, initial encounter: Secondary | ICD-10-CM | POA: Diagnosis not present

## 2017-09-27 DIAGNOSIS — E785 Hyperlipidemia, unspecified: Secondary | ICD-10-CM | POA: Diagnosis not present

## 2017-09-27 DIAGNOSIS — Z96619 Presence of unspecified artificial shoulder joint: Secondary | ICD-10-CM

## 2017-09-27 DIAGNOSIS — Z9841 Cataract extraction status, right eye: Secondary | ICD-10-CM

## 2017-09-27 DIAGNOSIS — Z96612 Presence of left artificial shoulder joint: Secondary | ICD-10-CM | POA: Diagnosis present

## 2017-09-27 DIAGNOSIS — Z86010 Personal history of colonic polyps: Secondary | ICD-10-CM

## 2017-09-27 DIAGNOSIS — Z8249 Family history of ischemic heart disease and other diseases of the circulatory system: Secondary | ICD-10-CM

## 2017-09-27 HISTORY — PX: REVERSE SHOULDER ARTHROPLASTY: SHX5054

## 2017-09-27 SURGERY — ARTHROPLASTY, SHOULDER, TOTAL, REVERSE
Anesthesia: General | Site: Shoulder | Laterality: Right

## 2017-09-27 MED ORDER — CHLORHEXIDINE GLUCONATE 4 % EX LIQD: 60.0000 mL | Freq: Once | CUTANEOUS | Status: DC

## 2017-09-27 MED ORDER — PHENOL 1.4 % MT LIQD: 1.0000 | OROMUCOSAL | Status: DC | PRN

## 2017-09-27 MED ORDER — ONDANSETRON HCL 4 MG PO TABS: 4.0000 mg | ORAL_TABLET | Freq: Four times a day (QID) | ORAL | Status: DC | PRN

## 2017-09-27 MED ORDER — DEXAMETHASONE SODIUM PHOSPHATE 10 MG/ML IJ SOLN
INTRAMUSCULAR | Status: AC
  Filled 2017-09-27: qty 1

## 2017-09-27 MED ORDER — LACTATED RINGERS IV SOLN
INTRAVENOUS | Status: DC
  Administered 2017-09-27: 19:00:00 via INTRAVENOUS

## 2017-09-27 MED ORDER — DOCUSATE SODIUM 100 MG PO CAPS
100.0000 mg | ORAL_CAPSULE | Freq: Two times a day (BID) | ORAL | Status: DC
  Administered 2017-09-27 – 2017-09-28 (×2): 100 mg via ORAL
  Filled 2017-09-27 (×2): qty 1

## 2017-09-27 MED ORDER — ZOLPIDEM TARTRATE 5 MG PO TABS
5.0000 mg | ORAL_TABLET | Freq: Every evening | ORAL | Status: DC | PRN
  Administered 2017-09-27: 5 mg via ORAL
  Filled 2017-09-27: qty 1

## 2017-09-27 MED ORDER — LIDOCAINE HCL (CARDIAC) 20 MG/ML IV SOLN
INTRAVENOUS | Status: DC | PRN
  Administered 2017-09-27: 40 mg via INTRAVENOUS

## 2017-09-27 MED ORDER — ALUM & MAG HYDROXIDE-SIMETH 200-200-20 MG/5ML PO SUSP: 30.0000 mL | ORAL | Status: DC | PRN

## 2017-09-27 MED ORDER — BUPIVACAINE-EPINEPHRINE (PF) 0.5% -1:200000 IJ SOLN
INTRAMUSCULAR | Status: DC | PRN
  Administered 2017-09-27: 12 mL via PERINEURAL

## 2017-09-27 MED ORDER — ONDANSETRON HCL 4 MG/2ML IJ SOLN
INTRAMUSCULAR | Status: AC
  Filled 2017-09-27: qty 2

## 2017-09-27 MED ORDER — FENTANYL CITRATE (PF) 100 MCG/2ML IJ SOLN
INTRAMUSCULAR | Status: AC
  Filled 2017-09-27: qty 2

## 2017-09-27 MED ORDER — GLYCOPYRROLATE 0.2 MG/ML IJ SOLN
INTRAMUSCULAR | Status: DC | PRN
  Administered 2017-09-27 (×2): 0.2 mg via INTRAVENOUS

## 2017-09-27 MED ORDER — ONDANSETRON HCL 4 MG/2ML IJ SOLN: 4.0000 mg | Freq: Four times a day (QID) | INTRAMUSCULAR | Status: DC | PRN

## 2017-09-27 MED ORDER — DIPHENHYDRAMINE HCL 12.5 MG/5ML PO ELIX: 12.5000 mg | ORAL_SOLUTION | ORAL | Status: DC | PRN

## 2017-09-27 MED ORDER — FENTANYL CITRATE (PF) 100 MCG/2ML IJ SOLN
50.0000 ug | Freq: Once | INTRAMUSCULAR | Status: AC
  Administered 2017-09-27: 50 ug via INTRAVENOUS

## 2017-09-27 MED ORDER — ROCURONIUM BROMIDE 10 MG/ML (PF) SYRINGE
PREFILLED_SYRINGE | INTRAVENOUS | Status: AC
  Filled 2017-09-27: qty 5

## 2017-09-27 MED ORDER — ACETAMINOPHEN 325 MG PO TABS: 325.0000 mg | ORAL_TABLET | Freq: Four times a day (QID) | ORAL | Status: DC | PRN

## 2017-09-27 MED ORDER — METOCLOPRAMIDE HCL 5 MG PO TABS: 5.0000 mg | ORAL_TABLET | Freq: Three times a day (TID) | ORAL | Status: DC | PRN

## 2017-09-27 MED ORDER — POLYETHYLENE GLYCOL 3350 17 G PO PACK: 17.0000 g | PACK | Freq: Every day | ORAL | Status: DC | PRN

## 2017-09-27 MED ORDER — MENTHOL 3 MG MT LOZG: 1.0000 | LOZENGE | OROMUCOSAL | Status: DC | PRN

## 2017-09-27 MED ORDER — HYDROMORPHONE HCL 1 MG/ML IJ SOLN: 0.5000 mg | INTRAMUSCULAR | Status: DC | PRN

## 2017-09-27 MED ORDER — PROPOFOL 10 MG/ML IV BOLUS
INTRAVENOUS | Status: DC | PRN
  Administered 2017-09-27: 130 mg via INTRAVENOUS

## 2017-09-27 MED ORDER — MIDAZOLAM HCL 2 MG/2ML IJ SOLN
INTRAMUSCULAR | Status: AC
  Filled 2017-09-27: qty 2

## 2017-09-27 MED ORDER — ROCURONIUM BROMIDE 100 MG/10ML IV SOLN
INTRAVENOUS | Status: DC | PRN
  Administered 2017-09-27: 50 mg via INTRAVENOUS

## 2017-09-27 MED ORDER — BUPIVACAINE LIPOSOME 1.3 % IJ SUSP
INTRAMUSCULAR | Status: DC | PRN
  Administered 2017-09-27: 10 mL via PERINEURAL

## 2017-09-27 MED ORDER — PROPOFOL 10 MG/ML IV BOLUS
INTRAVENOUS | Status: AC
  Filled 2017-09-27: qty 20

## 2017-09-27 MED ORDER — OXYCODONE HCL 5 MG PO TABS
5.0000 mg | ORAL_TABLET | ORAL | Status: DC | PRN
  Administered 2017-09-27: 10 mg via ORAL
  Filled 2017-09-27 (×2): qty 2

## 2017-09-27 MED ORDER — EPHEDRINE 5 MG/ML INJ
INTRAVENOUS | Status: AC
  Filled 2017-09-27: qty 10

## 2017-09-27 MED ORDER — ONDANSETRON HCL 4 MG/2ML IJ SOLN
INTRAMUSCULAR | Status: DC | PRN
  Administered 2017-09-27: 4 mg via INTRAVENOUS

## 2017-09-27 MED ORDER — FENTANYL CITRATE (PF) 250 MCG/5ML IJ SOLN
INTRAMUSCULAR | Status: AC
  Filled 2017-09-27: qty 5

## 2017-09-27 MED ORDER — METOCLOPRAMIDE HCL 5 MG/ML IJ SOLN: 5.0000 mg | Freq: Three times a day (TID) | INTRAMUSCULAR | Status: DC | PRN

## 2017-09-27 MED ORDER — 0.9 % SODIUM CHLORIDE (POUR BTL) OPTIME
TOPICAL | Status: DC | PRN
Start: 2017-09-27 — End: 2017-09-27
  Administered 2017-09-27: 1000 mL

## 2017-09-27 MED ORDER — MAGNESIUM CITRATE PO SOLN: 1.0000 | Freq: Once | ORAL | Status: DC | PRN

## 2017-09-27 MED ORDER — LACTATED RINGERS IV SOLN
INTRAVENOUS | Status: DC | PRN
  Administered 2017-09-27 (×2): via INTRAVENOUS

## 2017-09-27 MED ORDER — OXYCODONE HCL 5 MG PO TABS
10.0000 mg | ORAL_TABLET | ORAL | Status: DC | PRN
  Administered 2017-09-28: 10 mg via ORAL

## 2017-09-27 MED ORDER — ASPIRIN 81 MG PO CHEW
81.0000 mg | CHEWABLE_TABLET | Freq: Every day | ORAL | Status: DC
  Administered 2017-09-28: 81 mg via ORAL
  Filled 2017-09-27: qty 1

## 2017-09-27 MED ORDER — KETOROLAC TROMETHAMINE 15 MG/ML IJ SOLN
7.5000 mg | Freq: Four times a day (QID) | INTRAMUSCULAR | Status: DC
  Administered 2017-09-28 (×2): 7.5 mg via INTRAVENOUS
  Filled 2017-09-27 (×2): qty 1

## 2017-09-27 MED ORDER — MIDAZOLAM HCL 2 MG/2ML IJ SOLN: 1.0000 mg | Freq: Once | INTRAMUSCULAR | Status: DC

## 2017-09-27 MED ORDER — SUGAMMADEX SODIUM 200 MG/2ML IV SOLN
INTRAVENOUS | Status: AC
  Filled 2017-09-27: qty 2

## 2017-09-27 MED ORDER — EPHEDRINE SULFATE-NACL 50-0.9 MG/10ML-% IV SOSY
PREFILLED_SYRINGE | INTRAVENOUS | Status: DC | PRN
  Administered 2017-09-27 (×3): 5 mg via INTRAVENOUS

## 2017-09-27 MED ORDER — BISACODYL 5 MG PO TBEC: 5.0000 mg | DELAYED_RELEASE_TABLET | Freq: Every day | ORAL | Status: DC | PRN

## 2017-09-27 MED ORDER — TIZANIDINE HCL 4 MG PO TABS: 4.0000 mg | ORAL_TABLET | Freq: Four times a day (QID) | ORAL | Status: DC | PRN

## 2017-09-27 MED ORDER — TRAMADOL HCL 50 MG PO TABS: 50.0000 mg | ORAL_TABLET | Freq: Four times a day (QID) | ORAL | Status: DC | PRN

## 2017-09-27 MED ORDER — LIDOCAINE 2% (20 MG/ML) 5 ML SYRINGE
INTRAMUSCULAR | Status: AC
  Filled 2017-09-27: qty 5

## 2017-09-27 MED ORDER — PHENYLEPHRINE HCL 10 MG/ML IJ SOLN
INTRAVENOUS | Status: DC | PRN
  Administered 2017-09-27: 25 ug/min via INTRAVENOUS

## 2017-09-27 MED ORDER — FENTANYL CITRATE (PF) 100 MCG/2ML IJ SOLN
INTRAMUSCULAR | Status: DC | PRN
  Administered 2017-09-27 (×2): 50 ug via INTRAVENOUS

## 2017-09-27 MED ORDER — DEXAMETHASONE SODIUM PHOSPHATE 10 MG/ML IJ SOLN
INTRAMUSCULAR | Status: DC | PRN
  Administered 2017-09-27: 10 mg via INTRAVENOUS

## 2017-09-27 SURGICAL SUPPLY — 66 items
ADH SKN CLS APL DERMABOND .7 (GAUZE/BANDAGES/DRESSINGS) ×1
AID PSTN UNV HD RSTRNT DISP (MISCELLANEOUS) ×1
BASEPLATE GLENOID SHLDR SM (Shoulder) ×2 IMPLANT
BLADE SAW SGTL 83.5X18.5 (BLADE) ×3 IMPLANT
BSPLAT GLND SM PRFT SHLDR CA (Shoulder) ×1 IMPLANT
COVER SURGICAL LIGHT HANDLE (MISCELLANEOUS) ×3 IMPLANT
CUP SUT UNIV REVERS 39 NEU (Shoulder) ×3 IMPLANT
DERMABOND ADVANCED (GAUZE/BANDAGES/DRESSINGS) ×2
DERMABOND ADVANCED .7 DNX12 (GAUZE/BANDAGES/DRESSINGS) ×1 IMPLANT
DRAPE ORTHO SPLIT 77X108 STRL (DRAPES) ×6
DRAPE SURG 17X11 SM STRL (DRAPES) ×3 IMPLANT
DRAPE SURG ORHT 6 SPLT 77X108 (DRAPES) ×2 IMPLANT
DRAPE U-SHAPE 47X51 STRL (DRAPES) ×3 IMPLANT
DRSG AQUACEL AG ADV 3.5X10 (GAUZE/BANDAGES/DRESSINGS) ×3 IMPLANT
DRSG MEPILEX BORDER 4X8 (GAUZE/BANDAGES/DRESSINGS) ×2 IMPLANT
DURAPREP 26ML APPLICATOR (WOUND CARE) ×3 IMPLANT
ELECT BLADE 4.0 EZ CLEAN MEGAD (MISCELLANEOUS) ×3
ELECT CAUTERY BLADE 6.4 (BLADE) ×3 IMPLANT
ELECT REM PT RETURN 9FT ADLT (ELECTROSURGICAL) ×3
ELECTRODE BLDE 4.0 EZ CLN MEGD (MISCELLANEOUS) ×1 IMPLANT
ELECTRODE REM PT RTRN 9FT ADLT (ELECTROSURGICAL) ×1 IMPLANT
FACESHIELD WRAPAROUND (MASK) ×9 IMPLANT
FACESHIELD WRAPAROUND OR TEAM (MASK) ×3 IMPLANT
GLENOSPHERE LAT 39+4 SHOULDER (Shoulder) ×2 IMPLANT
GLOVE BIO SURGEON STRL SZ7.5 (GLOVE) ×5 IMPLANT
GLOVE BIO SURGEON STRL SZ8 (GLOVE) ×3 IMPLANT
GLOVE BIOGEL PI IND STRL 7.0 (GLOVE) IMPLANT
GLOVE BIOGEL PI INDICATOR 7.0 (GLOVE) ×2
GLOVE EUDERMIC 7 POWDERFREE (GLOVE) ×3 IMPLANT
GLOVE SS BIOGEL STRL SZ 7.5 (GLOVE) ×1 IMPLANT
GLOVE SUPERSENSE BIOGEL SZ 7.5 (GLOVE) ×2
GOWN STRL REUS W/ TWL LRG LVL3 (GOWN DISPOSABLE) ×1 IMPLANT
GOWN STRL REUS W/ TWL XL LVL3 (GOWN DISPOSABLE) ×2 IMPLANT
GOWN STRL REUS W/TWL LRG LVL3 (GOWN DISPOSABLE) ×3
GOWN STRL REUS W/TWL XL LVL3 (GOWN DISPOSABLE) ×6
INSERT HUMERAL MED 39/ +3 (Shoulder) IMPLANT
INSERT MEDIUM HUMERAL 39/ +3 (Shoulder) ×2 IMPLANT
KIT BASIN OR (CUSTOM PROCEDURE TRAY) ×3 IMPLANT
KIT TURNOVER KIT B (KITS) ×3 IMPLANT
MANIFOLD NEPTUNE II (INSTRUMENTS) ×3 IMPLANT
NDL SUT 6 .5 CRC .975X.05 MAYO (NEEDLE) IMPLANT
NEEDLE MAYO TAPER (NEEDLE)
NEEDLE TAPERED W/ NITINOL LOOP (MISCELLANEOUS) ×3 IMPLANT
NS IRRIG 1000ML POUR BTL (IV SOLUTION) ×3 IMPLANT
PACK SHOULDER (CUSTOM PROCEDURE TRAY) ×3 IMPLANT
PAD ARMBOARD 7.5X6 YLW CONV (MISCELLANEOUS) ×6 IMPLANT
RESTRAINT HEAD UNIVERSAL NS (MISCELLANEOUS) ×3 IMPLANT
SCREW CENTRAL NONLOCK 6.5X20MM (Shoulder) ×2 IMPLANT
SCREW LOCK PERIPHERAL 36MM (Screw) ×4 IMPLANT
SET PIN UNIVERSAL REVERSE (SET/KITS/TRAYS/PACK) ×2 IMPLANT
SLING ARM FOAM STRAP LRG (SOFTGOODS) IMPLANT
SPACER REVERSE UNI 39/ +6MM (Shoulder) ×1 IMPLANT
SPACER SHLD UNI REV 39 +6 (Shoulder) ×1 IMPLANT
SPONGE LAP 18X18 X RAY DECT (DISPOSABLE) ×3 IMPLANT
SPONGE LAP 4X18 X RAY DECT (DISPOSABLE) ×3 IMPLANT
STEM HUMERAL UNIVERS SZ8 (Stem) ×3 IMPLANT
SUT FIBERWIRE #2 38 T-5 BLUE (SUTURE) ×9
SUT MNCRL AB 3-0 PS2 18 (SUTURE) ×3 IMPLANT
SUT MON AB 2-0 CT1 27 (SUTURE) ×3 IMPLANT
SUT MON AB 2-0 CT1 36 (SUTURE) ×3 IMPLANT
SUT VIC AB 1 CT1 27 (SUTURE) ×3
SUT VIC AB 1 CT1 27XBRD ANBCTR (SUTURE) ×1 IMPLANT
SUTURE FIBERWR #2 38 T-5 BLUE (SUTURE) ×3 IMPLANT
TOWEL OR 17X24 6PK STRL BLUE (TOWEL DISPOSABLE) ×3 IMPLANT
TOWEL OR 17X26 10 PK STRL BLUE (TOWEL DISPOSABLE) ×3 IMPLANT
WATER STERILE IRR 1000ML POUR (IV SOLUTION) ×3 IMPLANT

## 2017-09-27 NOTE — H&P (Signed)
Shawn Bell    Chief Complaint: Right shoulder osteoarthritis HPI: The patient is a 77 y.o. male with end stage right shoulder OA  Past Medical History:  Diagnosis Date  . Arthritis    shoulders  . Cancer ([place])   . Diverticulosis of colon (without mention of hemorrhage)   . Hemorrhoids   . History of prostate cancer    S/P RADIATION [place]  . Hyperlipemia   . Intermittent self-catheterization of bladder   . Personal history of colonic polyps 01/23/2003    Past Surgical History:  Procedure Laterality Date  . CATARACT EXTRACTION W/ INTRAOCULAR LENS  IMPLANT, BILATERAL    . CHOLECYSTECTOMY  02/07/2012   Procedure: LAPAROSCOPIC CHOLECYSTECTOMY WITH INTRAOPERATIVE CHOLANGIOGRAM;  Surgeon: Earnstine Regal, MD;  Location: [facility];  Service: General;  Laterality: N/A;  . JOINT REPLACEMENT    . REVERSE SHOULDER ARTHROPLASTY Left 05/03/2017   Procedure: REVERSE LEFT SHOULDER ARTHROPLASTY;  Surgeon: Justice Britain, MD;  Location: [facility];  Service: Orthopedics;  Laterality: Left;  . SHOULDER ARTHROSCOPY     BILATERAL  . TOTAL HIP ARTHROPLASTY     right  . TOTAL HIP ARTHROPLASTY  03/06/2012   Procedure: TOTAL HIP ARTHROPLASTY;  Surgeon: Gearlean Alf, MD;  Location: [facility];  Service: Orthopedics;  Laterality: Left;    Family History  Problem Relation Age of Onset  . Heart failure Mother   . Colon cancer Neg Hx   . Colon polyps Neg Hx   . Rectal cancer Neg Hx   . Stomach cancer Neg Hx     Social History:  reports that he has never smoked. He has never used smokeless tobacco. He reports that he drinks alcohol. He reports that he does not use drugs.   Medications Prior to Admission  Medication Sig Dispense Refill  . acetaminophen (TYLENOL) 325 MG tablet Take 325 mg by mouth daily as needed for moderate pain or headache.     . Apoaequorin (PREVAGEN PO) Take 1 tablet by mouth daily.    Marland Kitchen aspirin 81 MG tablet Take 81 mg by mouth daily.    . Coenzyme Q10 (COQ10) 200 MG CAPS Take 200 mg by  mouth daily.    . Multiple Vitamins-Minerals (CENTRUM SILVER ADULT 50+ PO) Take 1 tablet daily by mouth.     . rosuvastatin (CRESTOR) 5 MG tablet Take 1 tablet (5 mg total) by mouth daily. Needs office visit and labs-2nd notice (Patient taking differently: Take 5 mg daily by mouth. ) 30 tablet 0  . zolpidem (AMBIEN) 10 MG tablet Take 5 mg at bedtime as needed by mouth for sleep.    . diazepam (VALIUM) 5 MG tablet Take 0.5-1 tablets (2.5-5 mg total) every 6 (six) hours as needed by mouth for muscle spasms or sedation. (Patient not taking: Reported on 09/07/2017) 40 tablet 1  . ondansetron (ZOFRAN) 4 MG tablet Take 1 tablet (4 mg total) every 8 (eight) hours as needed by mouth for nausea or vomiting. 20 tablet 0  . oxyCODONE (OXY IR/ROXICODONE) 5 MG immediate release tablet Take 1-2 tablets (5-10 mg total) every 4 (four) hours as needed by mouth for moderate pain ((score 4 to 6)). (Patient not taking: Reported on 09/07/2017) 50 tablet 0     Physical Exam: right shoulder with profoundly restricted motion and pain as noted at recent office visits  Vitals  Temp:  [98.2 F (36.8 C)] 98.2 F (36.8 C) (04/11 0846) Pulse Rate:  [57] 57 (04/11 0846) Resp:  [18] 18 (  04/11 0846) BP: (124)/(71) 124/71 (04/11 0846) SpO2:  [95 %] 95 % (04/11 0846)  Assessment/Plan  Impression: Right shoulder osteoarthritis  Plan of Action: Procedure(s): RIGHT REVERSE SHOULDER ARTHROPLASTY  Shawn Bell M Sherri Levenhagen 09/27/2017, 10:13 AM Contact # [phone]

## 2017-09-27 NOTE — Anesthesia Procedure Notes (Addendum)
Anesthesia Regional Block: Interscalene brachial plexus block   Pre-Anesthetic Checklist: ,, timeout performed, Correct Patient, Correct Site, Correct Laterality, Correct Procedure, Correct Position, site marked, Risks and benefits discussed,  Surgical consent,  Pre-op evaluation,  At surgeon's request and post-op pain management  Laterality: Upper and Right  Prep: chloraprep       Needles:  Injection technique: Single-shot  Needle Type: Echogenic Stimulator Needle     Needle Length: 4cm  Needle Gauge: 21   Needle insertion depth: 4 cm   Additional Needles:   Procedures:,,,, ultrasound used (permanent image in chart),,,,   Nerve Stimulator or Paresthesia:  Response: Twitch elicited, 0.5 mA, 0.3 ms,   Additional Responses:   Narrative:  Start time: 09/27/2017 9:40 AM End time: 09/27/2017 9:55 AM Injection made incrementally with aspirations every 5 mL.  Performed by: Personally  Anesthesiologist: Rica Koyanagi, MD  Additional Notes: Block assessed prior to start of surgery

## 2017-09-27 NOTE — Op Note (Signed)
09/27/2017  12:37 PM  PATIENT:   Shawn Bell  77 y.o. male  PRE-OPERATIVE DIAGNOSIS:  Right shoulder osteoarthritis  POST-OPERATIVE DIAGNOSIS:  same  PROCEDURE:  R RSA #8 stem, +6 spacer, +3 poly, 39/+4 glenosphere, small baseplate  SURGEON:  Bunny Lowdermilk, Metta Clines M.D.  ASSISTANTS: Shuford pac   ANESTHESIA:   GET + ISB  EBL: 200  SPECIMEN:  none  Drains: none   PATIENT DISPOSITION:  PACU - hemodynamically stable.    PLAN OF CARE: Admit for overnight observation  Dictation# [id number]   Contact # [phone]

## 2017-09-27 NOTE — Anesthesia Preprocedure Evaluation (Addendum)
Anesthesia Evaluation  Patient identified by MRN, date of birth, ID band Patient awake    Reviewed: Allergy & Precautions, NPO status , Patient's Chart, lab work & pertinent test results  Airway Mallampati: II  TM Distance: >3 FB Neck ROM: Full    Dental no notable dental hx. (+) Teeth Intact, Dental Advisory Given   Pulmonary neg pulmonary ROS,    breath sounds clear to auscultation       Cardiovascular negative cardio ROS   Rhythm:Regular Rate:Normal     Neuro/Psych negative neurological ROS  negative psych ROS   GI/Hepatic negative GI ROS, Neg liver ROS,   Endo/Other  negative endocrine ROS  Renal/GU negative Renal ROS  negative genitourinary   Musculoskeletal   Abdominal   Peds negative pediatric ROS (+)  Hematology negative hematology ROS (+)   Anesthesia Other Findings   Reproductive/Obstetrics negative OB ROS                            Anesthesia Physical Anesthesia Plan  ASA: I  Anesthesia Plan: General   Post-op Pain Management:  Regional for Post-op pain   Induction: Intravenous  PONV Risk Score and Plan: 3 and Treatment may vary due to age or medical condition, Ondansetron, Dexamethasone and Midazolam  Airway Management Planned: Oral ETT  Additional Equipment:   Intra-op Plan:   Post-operative Plan: Extubation in OR  Informed Consent: I have reviewed the patients History and Physical, chart, labs and discussed the procedure including the risks, benefits and alternatives for the proposed anesthesia with the patient or authorized representative who has indicated his/her understanding and acceptance.   Dental advisory given  Plan Discussed with: CRNA  Anesthesia Plan Comments:         Anesthesia Quick Evaluation

## 2017-09-27 NOTE — Transfer of Care (Signed)
Immediate Anesthesia Transfer of Care Note  Patient: Shawn Bell  Procedure(s) Performed: RIGHT REVERSE SHOULDER ARTHROPLASTY (Right Shoulder)  Patient Location: PACU  Anesthesia Type:GA combined with regional for post-op pain  Level of Consciousness: awake, alert  and oriented  Airway & Oxygen Therapy: Patient Spontanous Breathing and Patient connected to nasal cannula oxygen  Post-op Assessment: Report given to RN, Post -op Vital signs reviewed and stable and Patient moving all extremities  Post vital signs: Reviewed and stable  Last Vitals:  Vitals Value Taken Time  BP    Temp    Pulse 94 09/27/2017 12:57 PM  Resp 15 09/27/2017 12:57 PM  SpO2 97 % 09/27/2017 12:57 PM  Vitals shown include unvalidated device data.  Last Pain:  Vitals:   09/27/17 1015  TempSrc:   PainSc: 0-No pain      Patients Stated Pain Goal: 0 (01/74/94 4967)  Complications: No apparent anesthesia complications

## 2017-09-27 NOTE — Discharge Instructions (Signed)
° °Kevin M. Supple, M.D., F.A.A.O.S. °Orthopaedic Surgery °Specializing in Arthroscopic and Reconstructive °Surgery of the Shoulder and Knee °[phone] °[address] - [place], [place] [address] - Fax [phone] ° ° °POST-OP TOTAL SHOULDER REPLACEMENT INSTRUCTIONS ° °1. Call the office at [phone] to schedule your first post-op appointment 10-14 days from the date of your surgery. ° °2. The bandage over your incision is waterproof. You may begin showering with this dressing on. You may leave this dressing on until first follow up appointment within 2 weeks. We prefer you leave this dressing in place until follow up however after 5-7 days if you are having itching or skin irritation and would like to remove it you may do so. Go slow and tug at the borders gently to break the bond the dressing has with the skin. At this point if there is no drainage it is okay to go without a bandage or you may cover it with a light guaze and tape. You can also expect significant bruising around your shoulder that will drift down your arm and into your chest wall. This is very normal and should resolve over several days. ° ° 3. Wear your sling/immobilizer at all times except to perform the exercises below or to occasionally let your arm dangle by your side to stretch your elbow. You also need to sleep in your sling immobilizer until instructed otherwise. ° °4. Range of motion to your elbow, wrist, and hand are encouraged 3-5 times daily. Exercise to your hand and fingers helps to reduce swelling you may experience. ° °5. Utilize ice to the shoulder 3-5 times minimum a day and additionally if you are experiencing pain. ° °6. Prescriptions for a pain medication and a muscle relaxant are provided for you. It is recommended that if you are experiencing pain that you pain medication alone is not controlling, add the muscle relaxant along with the pain medication which can give additional pain relief. The first 1-2 days  is generally the most severe of your pain and then should gradually decrease. As your pain lessens it is recommended that you decrease your use of the pain medications to an "as needed basis'" only and to always comply with the recommended dosages of the pain medications. ° °7. Pain medications can produce constipation along with their use. If you experience this, the use of an over the counter stool softener or laxative daily is recommended.  ° °8. For additional questions or concerns, please do not hesitate to call the office. If after hours there is an answering service to forward your concerns to the physician on call. ° °9.Pain control following an exparel block ° °To help control your post-operative pain you received a nerve block  performed with Exparel which is a long acting anesthetic (numbing agent) which can provide pain relief and sensations of numbness (and relief of pain) in the operative shoulder and arm for up to 3 days. Sometimes it provides mixed relief, meaning you may still have numbness in certain areas of the arm but can still be able to move  parts of that arm, hand, and fingers. We recommend that your prescribed pain medications  be used as needed. We do not feel it is necessary to "pre medicate" and "stay ahead" of pain.  Taking narcotic pain medications when you are not having any pain can lead to unnecessary and potentially dangerous side effects.  ° °POST-OP EXERCISES ° °Pendulum Exercises ° °Perform pendulum exercises while standing and bending at   the waist. Support your uninvolved arm on a table or chair and allow your operated arm to hang freely. Make sure to do these exercises passively - not using you shoulder muscles. ° °Repeat 20 times. Do 3 sessions per day. ° ° ° ° °

## 2017-09-27 NOTE — Anesthesia Procedure Notes (Signed)
Procedure Name: Intubation Date/Time: 09/27/2017 10:47 AM Performed by: Kyung Rudd, CRNA Pre-anesthesia Checklist: Patient identified, Emergency Drugs available, Patient being monitored and Suction available Patient Re-evaluated:Patient Re-evaluated prior to induction Oxygen Delivery Method: Circle system utilized Preoxygenation: Pre-oxygenation with 100% oxygen Induction Type: IV induction Ventilation: Mask ventilation without difficulty Laryngoscope Size: Mac and 4 Grade View: Grade I Tube type: Oral Tube size: 7.5 mm Number of attempts: 1 Airway Equipment and Method: Stylet Placement Confirmation: ETT inserted through vocal cords under direct vision,  positive ETCO2 and breath sounds checked- equal and bilateral Secured at: 22 cm Tube secured with: Tape Dental Injury: Teeth and Oropharynx as per pre-operative assessment

## 2017-09-28 ENCOUNTER — Encounter (HOSPITAL_COMMUNITY): Payer: Self-pay | Admitting: Orthopedic Surgery

## 2017-09-28 MED ORDER — TIZANIDINE HCL 4 MG PO CAPS: 4.0000 mg | ORAL_CAPSULE | Freq: Three times a day (TID) | ORAL | 0 refills | Status: DC | PRN

## 2017-09-28 MED ORDER — OXYCODONE-ACETAMINOPHEN 5-325 MG PO TABS: 1.0000 | ORAL_TABLET | ORAL | 0 refills | Status: DC | PRN

## 2017-09-28 MED ORDER — NAPROXEN 500 MG PO TABS: 500.0000 mg | ORAL_TABLET | Freq: Two times a day (BID) | ORAL | 1 refills | Status: DC

## 2017-09-28 NOTE — Op Note (Signed)
NAME:  Shawn Bell, Shawn Bell                     ACCOUNT NO.:  MEDICAL RECORD NO.:  [id number]  LOCATION:                                 FACILITY:  PHYSICIAN:  Metta Clines. Izayiah Tibbitts, M.D.       DATE OF BIRTH:  DATE OF PROCEDURE:  09/27/2017 DATE OF DISCHARGE:                              OPERATIVE REPORT   PREOPERATIVE DIAGNOSIS:  End-stage right shoulder osteoarthritis with profound restriction mobility and rotator cuff dysfunction.  POSTOPERATIVE DIAGNOSIS:  End-stage right shoulder osteoarthritis with profound restriction mobility and rotator cuff dysfunction.  PROCEDURE:  Right shoulder reverse arthroplasty utilizing a press-fit size 8 Arthrex stem with a +6 spacer and a +3 polyethylene insert and a 39 +4 glenosphere on a small baseplate.  SURGEON:  Metta Clines. Devaney Segers, MD.  Terrence DupontReather Laurence. Shuford, P.A.-C.  ANESTHESIA:  A general endotracheal as well as interscalene block.  ESTIMATED BLOOD LOSS:  200 cc.  DRAINS:  None.  HISTORY:  [name] is a 77 year old gentleman, who has had increasing pain and functional limitations related to end-stage right shoulder osteoarthritis and rotator cuff dysfunction.  He is status post a left shoulder reverse arthroplasty for similar degenerative joint disease and has done very well.  He is brought to the operating room, at this time for planned right reverse shoulder arthroplasty.  Preoperatively, I counseled [name] regarding treatment options and potential risks versus benefits thereof.  Possible surgical complications were all reviewed including potential for bleeding, infection, neurovascular injury, persistent pain, loss of motion, anesthetic complication, failure of the implant, and possible need for additional surgery.  He understands and accepts and agrees with our planned procedure.  PROCEDURE IN DETAIL:  After undergoing routine preop evaluation, the patient received prophylactic antibiotics, and an interscalene block  was established in the holding room by the anesthesia department with Exparel, brought to the operating room, placed supine on the operative table.  Underwent smooth induction of a general endotracheal anesthesia. Placed in the beach-chair position and appropriately padded and protected.  The right shoulder girdle region was sterilely prepped and draped in standard fashion.  A time-out was called.  Anterior deltopectoral approach to right shoulder was made through 8 cm incision. Skin flaps were elevated.  Dissection carried deeply.  Deltopectoral interval was then developed from proximal to distal with cephalic vein taken laterally.  I should note that during the procedure, there was compromise of the cephalic vein, so it was ligated over the midpoint of its course of the surgical field.  Deltopectoral interval was incompletely developed from proximal to distal.  Upper centimeter and half of the pec major tendon was tenotomized for exposure.  Then, conjoint tendon was mobilized and retracted medially.  The long head biceps tendon was identified and tenodesed at the upper border of the pec major tendon and then unroofed and tenotomized proximally.  We then divided the subscapularis away from its insertion upon the lesser tuberosity and the free margin was intact with a pair of #2 FiberWire grasping sutures.  We then divided the capsular attachments from the anterior-inferior and inferior aspects of the humeral neck allowing delivery of the humeral head  through the wound.  Some small residual portion of the rotator cuff superiorly was then divided and allowed complete delivery of the humeral head through the wound, which was markedly deformed.  We outlined our proposed humeral head resection with the extramedullary guide.  This was then completed with an oscillating saw.  Then, the large osteophytes on the anterior and inferior margin of the neck were removed.  Metal cap placed over the cut  surface of the proximal humerus.  We then gained exposure of the glenoid with combination of Fukuda, pitchfork, and snake tongue retractors.  I performed a circumferential labral resection and also mobilized the subscapularis, which did ultimately showed good elasticity.  We had had a preop CT scan and based on the scan, we noted that he had had approximately 18 degrees of retroversion, so I used a guide to give [place] 10 degrees of correction on reaming of the glenoid.  Central guidepin was placed.  We then reamed this sequentially until we had a stable subchondral bony bed.  We then impacted our small baseplate.  Our central lag screw was inserted, followed by the inferior and superior locking screws, all of which obtained excellent fit and fixation.  We then used rongeur to remove the large marginal osteophytes from the superior, anterior, and inferior margins of the glenoid, then used the peripheral reamers to allow complete preparation of the glenoid.  We selected a 39 +4 glenosphere, which was then impacted down into our base plate and this showed excellent stability.  When this was completed, we then returned our attention back to the proximal humerus where the metal cap protecting the metaphysis was removed, we then hand reamed the canal up to size 7, broached to size 8, which again showed excellent fit and fixation.  The humeral metaphysis was then reamed with the central reaming guide.  Trial reduction at this point, showed good soft tissue balance and good fit.  At this point, we assembled our final prosthesis on the back table.  The trial was removed.  The canal was clean.  We then impacted the size 8 stem achieving excellent interference fit and fixation.  We then performed a series of trial reductions and ultimately found that +9 off the stem gave [place] excellent soft tissue balance, so we inserted a +6 spacer and then a +3 poly, and a final reduction was performed, which showed  excellent shoulder motion and good stability and good soft tissue balance.  The wound was then copiously irrigated. Hemostasis was obtained.  The subscapularis was then repaired back to the lesser tuberosity and collar of the implant anteriorly using the #2 FiberWires.  When this was completed, confirmed the shoulder could easily externally rotate to 45 degrees without excessive tension on the subscapularis.  Additional irrigation was then completed.  The deltopectoral interval was then reapproximated with a series of figure- of-eight #1 Vicryl sutures, 2-0 Monocryl was used for the subcu layer, intracuticular Monocryl for the skin, followed by Dermabond and Aquacel dressing.  Right arm was then placed in a sling.  The patient was awakened, extubated, and taken to the recovery room in stable condition.  Jenetta Loges, P.A.-C, was used as an Environmental consultant throughout this case, essential for help with positioning the patient, positioning the extremity, tissue manipulation, suture management, implantation of prosthesis, wound closure, and intraoperative decision making.     Metta Clines. Hershell Brandl, M.D.     KMS/MEDQ  D:  09/27/2017  T:  09/27/2017  Job:  [id number]

## 2017-09-28 NOTE — Progress Notes (Signed)
Removed IV, provided discharge education/instructions, all questions and concerns addressed, Pt not in distress, discharged home with belongings accompanied by wife. 

## 2017-09-28 NOTE — Discharge Summary (Signed)
PATIENT ID:      Shawn Bell  MRN:     [id number] DOB/AGE:    1941-04-12 / 77 y.o.     DISCHARGE SUMMARY  ADMISSION DATE:    09/27/2017 DISCHARGE DATE:    ADMISSION DIAGNOSIS: Right shoulder osteoarthritis Past Medical History:  Diagnosis Date  . Arthritis    shoulders  . Cancer ([place])   . Diverticulosis of colon (without mention of hemorrhage)   . Hemorrhoids   . History of prostate cancer    S/P RADIATION [place]  . Hyperlipemia   . Intermittent self-catheterization of bladder   . Personal history of colonic polyps 01/23/2003    DISCHARGE DIAGNOSIS:   Active Problems:   S/p reverse total shoulder arthroplasty   PROCEDURE: Procedure(s): RIGHT REVERSE SHOULDER ARTHROPLASTY on 09/27/2017  CONSULTS:    HISTORY:  See H&P in chart.  HOSPITAL COURSE:  Shawn Bell is a 77 y.o. admitted on 09/27/2017 with a diagnosis of Right shoulder osteoarthritis.  Shawn Bell were brought to the operating room on 09/27/2017 and underwent Procedure(s): RIGHT REVERSE SHOULDER ARTHROPLASTY.    Shawn Bell were given perioperative antibiotics:  Anti-infectives (From admission, onward)   Start     Dose/Rate Route Frequency Ordered Stop   09/27/17 0900  ceFAZolin (ANCEF) IVPB 2g/100 mL premix     2 g 200 mL/hr over 30 Minutes Intravenous To [facility] 09/26/17 0918 09/27/17 1058    .  Patient underwent the above named procedure and tolerated it well. The following day Shawn Bell were hemodynamically stable and pain was controlled on oral analgesics. Shawn Bell were neurovascularly intact to the operative extremity. OT was ordered but canceled as patient states he was aware of protocol given his recent surgery. He did have a slight nonproductive cough from his previous URI that was responding to lozenges Shawn Bell were medically and orthopaedically stable for discharge on day 1 .    DIAGNOSTIC STUDIES:  RECENT RADIOGRAPHIC STUDIES :  No results found.  RECENT VITAL SIGNS:   Patient Vitals for the past 24 hrs:  BP  Temp Temp src Pulse Resp SpO2  09/28/17 0454 99/64 98.2 F (36.8 C) Oral 81 - -  09/27/17 2147 104/71 98.3 F (36.8 C) Oral (!) 102 - 91 %  09/27/17 1445 122/86 98.3 F (36.8 C) Oral 74 18 94 %  09/27/17 1430 125/83 - - 64 19 95 %  09/27/17 1415 125/86 98 F (36.7 C) - 70 18 95 %  09/27/17 1400 127/85 - - 77 19 96 %  09/27/17 1345 - - - 75 19 94 %  09/27/17 1330 126/64 - - 84 16 94 %  09/27/17 1315 (!) 135/107 - - 96 18 95 %  09/27/17 1300 - - - (!) 101 20 95 %  09/27/17 1259 131/88 98 F (36.7 C) - - - -  09/27/17 1020 - - - (!) 57 - 97 %  09/27/17 1015 - - - 61 - 98 %  09/27/17 0846 124/71 98.2 F (36.8 C) Oral (!) 57 18 95 %  .  RECENT EKG RESULTS:    Orders placed or performed during the hospital encounter of 07/03/15  . EKG 12-Lead  . EKG 12-Lead  . EKG    DISCHARGE INSTRUCTIONS:    DISCHARGE MEDICATIONS:   Allergies as of 09/28/2017   No Known Allergies     Medication List    STOP taking these medications   acetaminophen 325 MG tablet Commonly known as:  TYLENOL   diazepam 5 MG  tablet Commonly known as:  VALIUM   ondansetron 4 MG tablet Commonly known as:  ZOFRAN   oxyCODONE 5 MG immediate release tablet Commonly known as:  Oxy IR/ROXICODONE     TAKE these medications   aspirin 81 MG tablet Take 81 mg by mouth daily.   CENTRUM SILVER ADULT 50+ PO Take 1 tablet daily by mouth.   CoQ10 200 MG Caps Take 200 mg by mouth daily.   naproxen 500 MG tablet Commonly known as:  NAPROSYN Take 1 tablet (500 mg total) by mouth 2 (two) times daily with a meal.   oxyCODONE-acetaminophen 5-325 MG tablet Commonly known as:  PERCOCET Take 1 tablet by mouth every 4 (four) hours as needed.   PREVAGEN PO Take 1 tablet by mouth daily.   rosuvastatin 5 MG tablet Commonly known as:  CRESTOR Take 1 tablet (5 mg total) by mouth daily. Needs office visit and labs-2nd notice What changed:  additional instructions   tiZANidine 4 MG capsule Commonly known as:   ZANAFLEX Take 1 capsule (4 mg total) by mouth 3 (three) times daily as needed for muscle spasms.   zolpidem 10 MG tablet Commonly known as:  AMBIEN Take 5 mg at bedtime as needed by mouth for sleep.       FOLLOW UP VISIT:   Follow-up Information    Justice Britain, MD.   Specialty:  Orthopedic Surgery Why:  call to be seen in 10-14 days Contact information: [address] [place]  [address] [phone]           DISCHARGE TO: Home   DISCHARGE CONDITION:  Thereasa Parkin Anuol[place]apo Mefferd for Dr. Lennette Bihari Supple 09/28/2017, 8:30 AM

## 2017-09-28 NOTE — Plan of Care (Signed)
  Problem: Nutrition: Goal: Adequate nutrition will be maintained Outcome: Progressing   Problem: Pain Managment: Goal: General experience of comfort will improve Outcome: Progressing   Problem: Activity: Goal: Ability to tolerate increased activity will improve Outcome: Progressing

## 2017-10-08 DIAGNOSIS — M19011 Primary osteoarthritis, right shoulder: Secondary | ICD-10-CM | POA: Diagnosis not present

## 2017-10-12 DIAGNOSIS — M19012 Primary osteoarthritis, left shoulder: Secondary | ICD-10-CM | POA: Diagnosis not present

## 2017-10-12 DIAGNOSIS — M19011 Primary osteoarthritis, right shoulder: Secondary | ICD-10-CM | POA: Diagnosis not present

## 2017-10-16 DIAGNOSIS — M19012 Primary osteoarthritis, left shoulder: Secondary | ICD-10-CM | POA: Diagnosis not present

## 2017-10-16 DIAGNOSIS — M19011 Primary osteoarthritis, right shoulder: Secondary | ICD-10-CM | POA: Diagnosis not present

## 2017-10-18 DIAGNOSIS — M19012 Primary osteoarthritis, left shoulder: Secondary | ICD-10-CM | POA: Diagnosis not present

## 2017-10-18 DIAGNOSIS — M19011 Primary osteoarthritis, right shoulder: Secondary | ICD-10-CM | POA: Diagnosis not present

## 2017-10-21 NOTE — Anesthesia Postprocedure Evaluation (Signed)
Anesthesia Post Note  Patient: Shawn Bell  Procedure(s) Performed: RIGHT REVERSE SHOULDER ARTHROPLASTY (Right Shoulder)     Patient location during evaluation: PACU Anesthesia Type: General and Regional Level of consciousness: awake and alert Pain management: pain level controlled Vital Signs Assessment: post-procedure vital signs reviewed and stable Respiratory status: spontaneous breathing, nonlabored ventilation, respiratory function stable and patient connected to nasal cannula oxygen Cardiovascular status: blood pressure returned to baseline and stable Postop Assessment: no apparent nausea or vomiting Anesthetic complications: no    Last Vitals:  Vitals:   09/27/17 2147 09/28/17 0454  BP: 104/71 99/64  Pulse: (!) 102 81  Resp:    Temp: 36.8 C 36.8 C  SpO2: 91%     Last Pain:  Vitals:   09/28/17 0800  TempSrc:   PainSc: 0-No pain                 Meia Emley,JAMES TERRILL

## 2017-10-23 DIAGNOSIS — M19011 Primary osteoarthritis, right shoulder: Secondary | ICD-10-CM | POA: Diagnosis not present

## 2017-10-23 DIAGNOSIS — M19012 Primary osteoarthritis, left shoulder: Secondary | ICD-10-CM | POA: Diagnosis not present

## 2017-10-25 DIAGNOSIS — M19012 Primary osteoarthritis, left shoulder: Secondary | ICD-10-CM | POA: Diagnosis not present

## 2017-10-25 DIAGNOSIS — M19011 Primary osteoarthritis, right shoulder: Secondary | ICD-10-CM | POA: Diagnosis not present

## 2017-11-01 DIAGNOSIS — M19011 Primary osteoarthritis, right shoulder: Secondary | ICD-10-CM | POA: Diagnosis not present

## 2017-11-01 DIAGNOSIS — M19012 Primary osteoarthritis, left shoulder: Secondary | ICD-10-CM | POA: Diagnosis not present

## 2017-11-06 DIAGNOSIS — M19011 Primary osteoarthritis, right shoulder: Secondary | ICD-10-CM | POA: Diagnosis not present

## 2017-11-06 DIAGNOSIS — M19012 Primary osteoarthritis, left shoulder: Secondary | ICD-10-CM | POA: Diagnosis not present

## 2017-11-13 DIAGNOSIS — M19012 Primary osteoarthritis, left shoulder: Secondary | ICD-10-CM | POA: Diagnosis not present

## 2017-11-13 DIAGNOSIS — M19011 Primary osteoarthritis, right shoulder: Secondary | ICD-10-CM | POA: Diagnosis not present

## 2017-11-15 DIAGNOSIS — M19011 Primary osteoarthritis, right shoulder: Secondary | ICD-10-CM | POA: Diagnosis not present

## 2017-11-15 DIAGNOSIS — M19012 Primary osteoarthritis, left shoulder: Secondary | ICD-10-CM | POA: Diagnosis not present

## 2017-11-22 DIAGNOSIS — M19011 Primary osteoarthritis, right shoulder: Secondary | ICD-10-CM | POA: Diagnosis not present

## 2017-11-22 DIAGNOSIS — M19012 Primary osteoarthritis, left shoulder: Secondary | ICD-10-CM | POA: Diagnosis not present

## 2017-12-04 DIAGNOSIS — M19012 Primary osteoarthritis, left shoulder: Secondary | ICD-10-CM | POA: Diagnosis not present

## 2017-12-04 DIAGNOSIS — M19011 Primary osteoarthritis, right shoulder: Secondary | ICD-10-CM | POA: Diagnosis not present

## 2017-12-17 DIAGNOSIS — Z96611 Presence of right artificial shoulder joint: Secondary | ICD-10-CM | POA: Diagnosis not present

## 2017-12-17 DIAGNOSIS — Z471 Aftercare following joint replacement surgery: Secondary | ICD-10-CM | POA: Diagnosis not present

## 2018-04-10 ENCOUNTER — Ambulatory Visit: Payer: Self-pay | Admitting: Surgery

## 2018-04-10 DIAGNOSIS — K4090 Unilateral inguinal hernia, without obstruction or gangrene, not specified as recurrent: Secondary | ICD-10-CM | POA: Diagnosis not present

## 2018-05-01 NOTE — Patient Instructions (Signed)
Shawn Bell  05/01/2018   Your procedure is scheduled on: 05-07-18   Report to [facility]  Entrance    Report to admitting at 8:30AM    Call this number if you have problems the morning of surgery [phone]     Remember: Do not eat food or drink liquids :After Midnight. BRUSH YOUR TEETH MORNING OF SURGERY AND RINSE YOUR MOUTH OUT, NO CHEWING GUM CANDY OR MINTS.     Take these medicines the morning of surgery with A SIP OF WATER: none                                You may not have any metal on your body including hair pins and              piercings  Do not wear jewelry, make-up, lotions, powders or perfumes, deodorant                 Men may shave face and neck.   Do not bring valuables to the hospital. [place].  Contacts, dentures or bridgework may not be worn into surgery.  Leave suitcase in the car. After surgery it may be brought to your room.     Patients discharged the day of surgery will not be allowed to drive home.  Name and phone number of your driver:  Special Instructions: N/A              Please read over the following fact sheets you were given: _____________________________________________________________________             [facility] - Preparing for Surgery Before surgery, you can play an important role.  Because skin is not sterile, your skin needs to be as free of germs as possible.  You can reduce the number of germs on your skin by washing with CHG (chlorahexidine gluconate) soap before surgery.  CHG is an antiseptic cleaner which kills germs and bonds with the skin to continue killing germs even after washing. Please DO NOT use if you have an allergy to CHG or antibacterial soaps.  If your skin becomes reddened/irritated stop using the CHG and inform your nurse when you arrive at Short Stay. Do not shave (including legs and underarms) for at least 48 hours prior to  the first CHG shower.  You may shave your face/neck. Please follow these instructions carefully:  1.  Shower with CHG Soap the night before surgery and the  morning of Surgery.  2.  If you choose to wash your hair, wash your hair first as usual with your  normal  shampoo.  3.  After you shampoo, rinse your hair and body thoroughly to remove the  shampoo.                           4.  Use CHG as you would any other liquid soap.  You can apply chg directly  to the skin and wash                       Gently with a scrungie or clean washcloth.  5.  Apply the CHG Soap to your body  ONLY FROM THE NECK DOWN.   Do not use on face/ open                           Wound or open sores. Avoid contact with eyes, ears mouth and genitals (private parts).                       Wash face,  Genitals (private parts) with your normal soap.             6.  Wash thoroughly, paying special attention to the area where your surgery  will be performed.  7.  Thoroughly rinse your body with warm water from the neck down.  8.  DO NOT shower/wash with your normal soap after using and rinsing off  the CHG Soap.                9.  Pat yourself dry with a clean towel.            10.  Wear clean pajamas.            11.  Place clean sheets on your bed the night of your first shower and do not  sleep with pets. Day of Surgery : Do not apply any lotions/deodorants the morning of surgery.  Please wear clean clothes to the hospital/surgery center.  FAILURE TO FOLLOW THESE INSTRUCTIONS MAY RESULT IN THE CANCELLATION OF YOUR SURGERY PATIENT SIGNATURE_________________________________  NURSE SIGNATURE__________________________________  ________________________________________________________________________

## 2018-05-02 ENCOUNTER — Other Ambulatory Visit: Payer: Self-pay

## 2018-05-02 ENCOUNTER — Encounter (HOSPITAL_COMMUNITY): Payer: Self-pay

## 2018-05-02 ENCOUNTER — Encounter (HOSPITAL_COMMUNITY)
Admission: RE | Admit: 2018-05-02 | Discharge: 2018-05-02 | Disposition: A | Discharge status: Home / Self Care | Payer: BLUE CROSS/BLUE SHIELD | Source: Ambulatory Visit | Attending: Surgery | Admitting: Surgery

## 2018-05-02 DIAGNOSIS — Z01812 Encounter for preprocedural laboratory examination: Secondary | ICD-10-CM | POA: Diagnosis not present

## 2018-05-02 LAB — CBC
HCT: 45.4 % (ref 39.0–52.0)
HEMOGLOBIN: 15.1 g/dL (ref 13.0–17.0)
MCH: 32.3 pg (ref 26.0–34.0)
MCHC: 33.3 g/dL (ref 30.0–36.0)
MCV: 97.2 fL (ref 80.0–100.0)
PLATELETS: 209 10*3/uL (ref 150–400)
RBC: 4.67 MIL/uL (ref 4.22–5.81)
RDW: 12.8 % (ref 11.5–15.5)
WBC: 6.4 10*3/uL (ref 4.0–10.5)
nRBC: 0 % (ref 0.0–0.2)

## 2018-05-06 ENCOUNTER — Encounter (HOSPITAL_COMMUNITY): Payer: Self-pay | Admitting: Surgery

## 2018-05-06 DIAGNOSIS — K4090 Unilateral inguinal hernia, without obstruction or gangrene, not specified as recurrent: Secondary | ICD-10-CM

## 2018-05-06 NOTE — H&P (Signed)
General Surgery [facility]  Annamaria Boots Documented: 04/10/2018 11:20 AM Location: [facility] Patient #: [id number] DOB: 08/07/1940 Married / Language: Cleophus Molt / Race: White Male   History of Present Illness Earnstine Regal MD; 04/10/2018 12:05 PM) The patient is a 77 year old male who presents with an inguinal hernia.  CHIEF COMPLAINT: right inguinal hernia  Patient is self-referred for evaluation of new right inguinal hernia. Patient is known to my practice from previous cholecystectomy. He contacted our office over the past weekend with a new bulge in the right groin. After instruction from our surgeon, he was able to reduce the hernia. He initially had discomfort but no longer has any discomfort. He denies any signs or symptoms of obstruction. He has had no prior hernia repairs. His only prior abdominal surgery was cholecystectomy. He presents today for evaluation and recommendations for management. He has had no symptoms on the left side.   Past Surgical History Geni Bers [place], RMA; 04/10/2018 11:20 AM) Gallbladder Surgery - Open  Hip Surgery  Bilateral. Shoulder Surgery  Bilateral.  Diagnostic Studies History [facility], RMA; 04/10/2018 11:20 AM) Colonoscopy  5-10 years ago  Allergies Geni Bers [place], RMA; 04/10/2018 11:21 AM) No Known Drug Allergies [04/10/2018]: Allergies Reconciled   Medication History [employer], RMA; 04/10/2018 11:21 AM) Crestor (5MG  Tablet, Oral) Active. CoQ10 (200MG  Capsule, Oral) Active. Aspirin (81MG  Tablet DR, Oral) Active. Centrum Silver (Oral) Active. Medications Reconciled  Social History Geni Bers [place], RMA; 04/10/2018 11:20 AM) Alcohol use  Occasional alcohol use. No caffeine use  No drug use  Tobacco use  Never smoker.  Family History Geni Bers [place], RMA; 04/10/2018 11:20 AM) Hypertension  Mother.  Other Problems Geni Bers [place],  RMA; 04/10/2018 11:20 AM) Cholelithiasis  Hypercholesterolemia  Inguinal Hernia     Review of Systems (Jacqueline Haggett RMA; 04/10/2018 11:20 AM) General Not Present- Appetite Loss, Chills, Fatigue, Fever, Night Sweats, Weight Gain and Weight Loss. Skin Not Present- Change in Wart/Mole, Dryness, Hives, Jaundice, New Lesions, Non-Healing Wounds, Rash and Ulcer. HEENT Not Present- Earache, Hearing Loss, Hoarseness, Nose Bleed, Oral Ulcers, Ringing in the Ears, Seasonal Allergies, Sinus Pain, Sore Throat, Visual Disturbances, Wears glasses/contact lenses and Yellow Eyes. Respiratory Not Present- Bloody sputum, Chronic Cough, Difficulty Breathing, Snoring and Wheezing. Cardiovascular Not Present- Chest Pain, Difficulty Breathing Lying Down, Leg Cramps, Palpitations, Rapid Heart Rate, Shortness of Breath and Swelling of Extremities. Gastrointestinal Not Present- Abdominal Pain, Bloating, Bloody Stool, Change in Bowel Habits, Chronic diarrhea, Constipation, Difficulty Swallowing, Excessive gas, Gets full quickly at meals, Hemorrhoids, Indigestion, Nausea, Rectal Pain and Vomiting. Male Genitourinary Not Present- Blood in Urine, Change in Urinary Stream, Frequency, Impotence, Nocturia, Painful Urination, Urgency and Urine Leakage. Musculoskeletal Not Present- Back Pain, Joint Pain, Joint Stiffness, Muscle Pain, Muscle Weakness and Swelling of Extremities. Neurological Not Present- Decreased Memory, Fainting, Headaches, Numbness, Seizures, Tingling, Tremor, Trouble walking and Weakness. Psychiatric Not Present- Anxiety, Bipolar, Change in Sleep Pattern, Depression, Fearful and Frequent crying. Endocrine Not Present- Cold Intolerance, Excessive Hunger, Hair Changes, Heat Intolerance, Hot flashes and New Diabetes. Hematology Not Present- Blood Thinners, Easy Bruising, Excessive bleeding, Gland problems, HIV and Persistent Infections.  Vitals [employer] Haggett RMA; 04/10/2018 11:22 AM) 04/10/2018  11:21 AM Weight: 213.6 lb Height: 73in Body Surface Area: 2.21 m Body Mass Index: 28.18 kg/m  Temp.: 97.60F(Temporal)  Pulse: 82 (Regular)  P.OX: 97% (Room air) BP: 128/64 (Sitting, Left Arm, Standard)       Physical Exam Earnstine Regal MD; 04/10/2018 12:05 PM)  The physical exam findings are as follows: Note:See vital signs recorded above  GENERAL APPEARANCE Development: normal Nutritional status: normal Gross deformities: none  SKIN Rash, lesions, ulcers: none Induration, erythema: none Nodules: none palpable  EYES Conjunctiva and lids: normal Pupils: equal and reactive Iris: normal bilaterally  EARS, NOSE, MOUTH, THROAT External ears: no lesion or deformity External nose: no lesion or deformity Hearing: grossly normal Lips: no lesion or deformity Dentition: normal for age Oral mucosa: moist  NECK Symmetric: yes Trachea: midline Thyroid: no palpable nodules in the thyroid bed  CHEST Respiratory effort: normal Retraction or accessory muscle use: no Breath sounds: normal bilaterally Rales, rhonchi, wheeze: none  CARDIOVASCULAR Auscultation: regular rhythm, normal rate Murmurs: none Pulses: carotid and radial pulse 2+ palpable Lower extremity edema: none Lower extremity varicosities: none  ABDOMEN Distension: none Masses: none palpable Tenderness: none Hepatosplenomegaly: not present Hernia: not present  GENITOURINARY Penis: no lesions Scrotum: no masses Obvious bulge in right groin. Palpation in right inguinal canal with cough and Valsalva shows small to moderate sized inguinal hernia which is reducible. There is no tenderness. Palpation in the left inguinal canal with cough and Valsalva shows no sign of hernia.  MUSCULOSKELETAL Station and gait: normal Digits and nails: no clubbing or cyanosis Muscle strength: grossly normal all extremities Range of motion: grossly normal all extremities Deformity: none  LYMPHATIC Cervical:  none palpable Supraclavicular: none palpable  PSYCHIATRIC Oriented to person, place, and time: yes Mood and affect: normal for situation Judgment and insight: appropriate for situation    Assessment & Plan Earnstine Regal MD; 04/10/2018 12:07 PM)  INGUINAL HERNIA OF RIGHT SIDE WITHOUT OBSTRUCTION OR GANGRENE (K40.90)  Pt Education - Pamphlet Given - Hernia Surgery: discussed with patient and provided information.  Patient presents with newly diagnosed right inguinal hernia. He is given written literature on hernia surgery to review at home.  Patient has a reducible right inguinal hernia which is small to moderate in size. It has been mildly symptomatic. Patient wishes to proceed with operative repair in the near future. We discussed hernia surgery using mesh. We discussed restrictions on his activities after the procedure. Patient understands and wishes to proceed with surgery as an outpatient procedure in the near future. We will make arrangements at a time convenient for the patient.  The risks and benefits of the procedure have been discussed at length with the patient. The patient understands the proposed procedure, potential alternative treatments, and the course of recovery to be expected. All of the patient's questions have been answered at this time. The patient wishes to proceed with surgery.   Armandina Gemma, [facility] Office: [phone]

## 2018-05-06 NOTE — Anesthesia Preprocedure Evaluation (Addendum)
Anesthesia Evaluation  Patient identified by MRN, date of birth, ID band Patient awake    Reviewed: Allergy & Precautions, NPO status , Patient's Chart, lab work & pertinent test results  History of Anesthesia Complications Negative for: history of anesthetic complications  Airway Mallampati: II  TM Distance: >3 FB Neck ROM: Full    Dental no notable dental hx. (+) Teeth Intact, Dental Advisory Given   Pulmonary neg pulmonary ROS,    Pulmonary exam normal breath sounds clear to auscultation       Cardiovascular negative cardio ROS Normal cardiovascular exam Rhythm:Regular Rate:Normal     Neuro/Psych negative neurological ROS     GI/Hepatic negative GI ROS, Neg liver ROS,   Endo/Other  negative endocrine ROS  Renal/GU negative Renal ROS   Hx of prostate cancer Urinary retention with intermittent self-catheterization    Musculoskeletal  (+) Arthritis , Osteoarthritis,    Abdominal   Peds  Hematology negative hematology ROS (+)   Anesthesia Other Findings Day of surgery medications reviewed with the patient.  Reproductive/Obstetrics                            Anesthesia Physical Anesthesia Plan  ASA: II  Anesthesia Plan: General   Post-op Pain Management:  Regional for Post-op pain   Induction: Intravenous  PONV Risk Score and Plan: 2 and Treatment may vary due to age or medical condition, Ondansetron and Midazolam  Airway Management Planned: Oral ETT  Additional Equipment:   Intra-op Plan:   Post-operative Plan: Extubation in OR  Informed Consent: I have reviewed the patients History and Physical, chart, labs and discussed the procedure including the risks, benefits and alternatives for the proposed anesthesia with the patient or authorized representative who has indicated his/her understanding and acceptance.   Dental advisory given  Plan Discussed with:  CRNA  Anesthesia Plan Comments: (TAP block)      Anesthesia Quick Evaluation

## 2018-05-07 ENCOUNTER — Ambulatory Visit (HOSPITAL_COMMUNITY)
Admission: RE | Admit: 2018-05-07 | Discharge: 2018-05-07 | Disposition: A | Discharge status: Home / Self Care | Payer: BLUE CROSS/BLUE SHIELD | Source: Ambulatory Visit | Attending: Surgery | Admitting: Surgery

## 2018-05-07 ENCOUNTER — Encounter (HOSPITAL_COMMUNITY): Payer: Self-pay | Admitting: *Deleted

## 2018-05-07 ENCOUNTER — Ambulatory Visit (HOSPITAL_COMMUNITY): Payer: BLUE CROSS/BLUE SHIELD | Admitting: Anesthesiology

## 2018-05-07 ENCOUNTER — Encounter (HOSPITAL_COMMUNITY)
Admission: RE | Disposition: A | Discharge status: Home / Self Care | Payer: Self-pay | Source: Ambulatory Visit | Attending: Surgery

## 2018-05-07 DIAGNOSIS — G8918 Other acute postprocedural pain: Secondary | ICD-10-CM | POA: Diagnosis not present

## 2018-05-07 DIAGNOSIS — K4090 Unilateral inguinal hernia, without obstruction or gangrene, not specified as recurrent: Secondary | ICD-10-CM | POA: Diagnosis not present

## 2018-05-07 DIAGNOSIS — E785 Hyperlipidemia, unspecified: Secondary | ICD-10-CM | POA: Diagnosis not present

## 2018-05-07 HISTORY — PX: INGUINAL HERNIA REPAIR: SHX194

## 2018-05-07 SURGERY — REPAIR, HERNIA, INGUINAL, ADULT
Anesthesia: General | Site: Inguinal | Laterality: Right

## 2018-05-07 MED ORDER — ROCURONIUM BROMIDE 10 MG/ML (PF) SYRINGE
PREFILLED_SYRINGE | INTRAVENOUS | Status: DC | PRN
  Administered 2018-05-07: 70 mg via INTRAVENOUS

## 2018-05-07 MED ORDER — DEXAMETHASONE SODIUM PHOSPHATE 10 MG/ML IJ SOLN
INTRAMUSCULAR | Status: DC | PRN
  Administered 2018-05-07: 10 mg via INTRAVENOUS

## 2018-05-07 MED ORDER — SUGAMMADEX SODIUM 200 MG/2ML IV SOLN
INTRAVENOUS | Status: AC
  Filled 2018-05-07: qty 4

## 2018-05-07 MED ORDER — PROPOFOL 10 MG/ML IV BOLUS
INTRAVENOUS | Status: AC
  Filled 2018-05-07: qty 20

## 2018-05-07 MED ORDER — PROPOFOL 10 MG/ML IV BOLUS
INTRAVENOUS | Status: DC | PRN
  Administered 2018-05-07: 180 mg via INTRAVENOUS

## 2018-05-07 MED ORDER — ONDANSETRON HCL 4 MG/2ML IJ SOLN
INTRAMUSCULAR | Status: AC
  Filled 2018-05-07: qty 2

## 2018-05-07 MED ORDER — FENTANYL CITRATE (PF) 100 MCG/2ML IJ SOLN: 25.0000 ug | INTRAMUSCULAR | Status: DC | PRN

## 2018-05-07 MED ORDER — CHLORHEXIDINE GLUCONATE CLOTH 2 % EX PADS
6.0000 | MEDICATED_PAD | Freq: Once | CUTANEOUS | Status: AC
  Administered 2018-05-07: 6 via TOPICAL

## 2018-05-07 MED ORDER — MIDAZOLAM HCL 2 MG/2ML IJ SOLN
1.0000 mg | INTRAMUSCULAR | Status: DC
  Administered 2018-05-07: 2 mg via INTRAVENOUS
  Filled 2018-05-07: qty 2

## 2018-05-07 MED ORDER — HYDROCODONE-ACETAMINOPHEN 5-325 MG PO TABS: 1.0000 | ORAL_TABLET | Freq: Four times a day (QID) | ORAL | 0 refills | Status: DC | PRN

## 2018-05-07 MED ORDER — ROCURONIUM BROMIDE 100 MG/10ML IV SOLN
INTRAVENOUS | Status: AC
  Filled 2018-05-07: qty 1

## 2018-05-07 MED ORDER — ACETAMINOPHEN 10 MG/ML IV SOLN: 1000.0000 mg | Freq: Once | INTRAVENOUS | Status: DC | PRN

## 2018-05-07 MED ORDER — BUPIVACAINE HCL (PF) 0.5 % IJ SOLN
INTRAMUSCULAR | Status: AC
  Filled 2018-05-07: qty 30

## 2018-05-07 MED ORDER — EPHEDRINE SULFATE-NACL 50-0.9 MG/10ML-% IV SOSY
PREFILLED_SYRINGE | INTRAVENOUS | Status: DC | PRN
  Administered 2018-05-07: 10 mg via INTRAVENOUS

## 2018-05-07 MED ORDER — LIDOCAINE 2% (20 MG/ML) 5 ML SYRINGE
INTRAMUSCULAR | Status: AC
  Filled 2018-05-07: qty 5

## 2018-05-07 MED ORDER — FENTANYL CITRATE (PF) 100 MCG/2ML IJ SOLN
50.0000 ug | INTRAMUSCULAR | Status: DC
  Administered 2018-05-07: 50 ug via INTRAVENOUS
  Filled 2018-05-07: qty 2

## 2018-05-07 MED ORDER — FENTANYL CITRATE (PF) 250 MCG/5ML IJ SOLN
INTRAMUSCULAR | Status: DC | PRN
  Administered 2018-05-07 (×2): 50 ug via INTRAVENOUS
  Administered 2018-05-07: 100 ug via INTRAVENOUS
  Administered 2018-05-07: 50 ug via INTRAVENOUS

## 2018-05-07 MED ORDER — FENTANYL CITRATE (PF) 250 MCG/5ML IJ SOLN
INTRAMUSCULAR | Status: AC
  Filled 2018-05-07: qty 5

## 2018-05-07 MED ORDER — ONDANSETRON HCL 4 MG/2ML IJ SOLN
INTRAMUSCULAR | Status: DC | PRN
  Administered 2018-05-07: 4 mg via INTRAVENOUS

## 2018-05-07 MED ORDER — PHENYLEPHRINE 40 MCG/ML (10ML) SYRINGE FOR IV PUSH (FOR BLOOD PRESSURE SUPPORT)
PREFILLED_SYRINGE | INTRAVENOUS | Status: DC | PRN
  Administered 2018-05-07: 80 ug via INTRAVENOUS

## 2018-05-07 MED ORDER — BUPIVACAINE-EPINEPHRINE (PF) 0.5% -1:200000 IJ SOLN
INTRAMUSCULAR | Status: DC | PRN
  Administered 2018-05-07: 30 mL

## 2018-05-07 MED ORDER — PROMETHAZINE HCL 25 MG/ML IJ SOLN: 6.2500 mg | INTRAMUSCULAR | Status: DC | PRN

## 2018-05-07 MED ORDER — OXYCODONE HCL 5 MG/5ML PO SOLN
5.0000 mg | Freq: Once | ORAL | Status: DC | PRN
  Filled 2018-05-07: qty 5

## 2018-05-07 MED ORDER — DEXAMETHASONE SODIUM PHOSPHATE 10 MG/ML IJ SOLN
INTRAMUSCULAR | Status: AC
  Filled 2018-05-07: qty 1

## 2018-05-07 MED ORDER — 0.9 % SODIUM CHLORIDE (POUR BTL) OPTIME
TOPICAL | Status: DC | PRN
  Administered 2018-05-07: 1000 mL

## 2018-05-07 MED ORDER — SUGAMMADEX SODIUM 200 MG/2ML IV SOLN
INTRAVENOUS | Status: DC | PRN
  Administered 2018-05-07: 350 mg via INTRAVENOUS

## 2018-05-07 MED ORDER — LIDOCAINE 2% (20 MG/ML) 5 ML SYRINGE
INTRAMUSCULAR | Status: DC | PRN
  Administered 2018-05-07: 60 mg via INTRAVENOUS

## 2018-05-07 MED ORDER — LACTATED RINGERS IV SOLN
INTRAVENOUS | Status: DC
  Administered 2018-05-07: 09:00:00 via INTRAVENOUS

## 2018-05-07 MED ORDER — CEFAZOLIN SODIUM-DEXTROSE 2-4 GM/100ML-% IV SOLN
2.0000 g | INTRAVENOUS | Status: AC
  Administered 2018-05-07: 2 g via INTRAVENOUS
  Filled 2018-05-07: qty 100

## 2018-05-07 MED ORDER — OXYCODONE HCL 5 MG PO TABS: 5.0000 mg | ORAL_TABLET | Freq: Once | ORAL | Status: DC | PRN

## 2018-05-07 MED ORDER — BUPIVACAINE HCL (PF) 0.5 % IJ SOLN
INTRAMUSCULAR | Status: DC | PRN
  Administered 2018-05-07: 20 mL

## 2018-05-07 SURGICAL SUPPLY — 41 items
ADH SKN CLS APL DERMABOND .7 (GAUZE/BANDAGES/DRESSINGS) ×1
APL SWBSTK 6 STRL LF DISP (MISCELLANEOUS)
APPLICATOR COTTON TIP 6 STRL (MISCELLANEOUS) ×1 IMPLANT
APPLICATOR COTTON TIP 6IN STRL (MISCELLANEOUS)
BLADE SURG 15 STRL LF DISP TIS (BLADE) ×1 IMPLANT
BLADE SURG 15 STRL SS (BLADE) ×3
BLADE SURG SZ10 CARB STEEL (BLADE) IMPLANT
CHLORAPREP W/TINT 26ML (MISCELLANEOUS) ×4 IMPLANT
CLOSURE WOUND 1/2 X4 (GAUZE/BANDAGES/DRESSINGS) ×1
COVER SURGICAL LIGHT HANDLE (MISCELLANEOUS) ×3 IMPLANT
COVER WAND RF STERILE (DRAPES) ×2 IMPLANT
DECANTER SPIKE VIAL GLASS SM (MISCELLANEOUS) ×3 IMPLANT
DERMABOND ADVANCED (GAUZE/BANDAGES/DRESSINGS) ×2
DERMABOND ADVANCED .7 DNX12 (GAUZE/BANDAGES/DRESSINGS) ×1 IMPLANT
DRAIN PENROSE 18X1/2 LTX STRL (DRAIN) ×3 IMPLANT
DRAPE LAPAROTOMY TRNSV 102X78 (DRAPE) ×3 IMPLANT
ELECT PENCIL ROCKER SW 15FT (MISCELLANEOUS) ×3 IMPLANT
ELECT REM PT RETURN 15FT ADLT (MISCELLANEOUS) ×3 IMPLANT
GAUZE SPONGE 4X4 12PLY STRL (GAUZE/BANDAGES/DRESSINGS) ×3 IMPLANT
GLOVE BIOGEL PI IND STRL 7.0 (GLOVE) ×1 IMPLANT
GLOVE BIOGEL PI INDICATOR 7.0 (GLOVE) ×2
GLOVE SURG ORTHO 8.0 STRL STRW (GLOVE) ×3 IMPLANT
GOWN STRL REUS W/TWL LRG LVL3 (GOWN DISPOSABLE) ×3 IMPLANT
GOWN STRL REUS W/TWL XL LVL3 (GOWN DISPOSABLE) ×6 IMPLANT
KIT BASIN OR (CUSTOM PROCEDURE TRAY) ×3 IMPLANT
MESH ULTRAPRO 3X6 7.6X15CM (Mesh General) ×2 IMPLANT
NDL HYPO 25X1 1.5 SAFETY (NEEDLE) ×1 IMPLANT
NEEDLE HYPO 25X1 1.5 SAFETY (NEEDLE) ×3 IMPLANT
NS IRRIG 1000ML POUR BTL (IV SOLUTION) ×3 IMPLANT
PACK BASIC VI WITH GOWN DISP (CUSTOM PROCEDURE TRAY) ×3 IMPLANT
SPONGE LAP 4X18 RFD (DISPOSABLE) ×5 IMPLANT
STRIP CLOSURE SKIN 1/2X4 (GAUZE/BANDAGES/DRESSINGS) ×2 IMPLANT
SUT MNCRL AB 4-0 PS2 18 (SUTURE) ×3 IMPLANT
SUT NOVA NAB GS-21 0 18 T12 DT (SUTURE) ×2 IMPLANT
SUT NOVA NAB GS-22 2 0 T19 (SUTURE) ×6 IMPLANT
SUT SILK 2 0 SH (SUTURE) ×3 IMPLANT
SUT VIC AB 3-0 SH 18 (SUTURE) ×3 IMPLANT
SYR BULB IRRIGATION 50ML (SYRINGE) ×3 IMPLANT
SYR CONTROL 10ML LL (SYRINGE) ×3 IMPLANT
TOWEL OR 17X26 10 PK STRL BLUE (TOWEL DISPOSABLE) ×3 IMPLANT
YANKAUER SUCT BULB TIP 10FT TU (MISCELLANEOUS) ×3 IMPLANT

## 2018-05-07 NOTE — Addendum Note (Signed)
Addendum  created 05/07/18 1222 by Mitzie Na, CRNA   Intraprocedure Meds edited

## 2018-05-07 NOTE — Interval H&P Note (Signed)
History and Physical Interval Note:  05/07/2018 10:25 AM  Shawn Bell  has presented today for surgery, with the diagnosis of right inguinal hernia.  The various methods of treatment have been discussed with the patient and family. After consideration of risks, benefits and other options for treatment, the patient has consented to    Procedure(s): OPEN RIGHT INGUINAL HERNIA REPAIR WITH INSERTION OF MESH (Right) as a surgical intervention .    The patient's history has been reviewed, patient examined, no change in status, stable for surgery.  I have reviewed the patient's chart and labs.  Questions were answered to the patient's satisfaction.    Armandina Gemma, [place] [facility] Office: [place]

## 2018-05-07 NOTE — Transfer of Care (Signed)
Immediate Anesthesia Transfer of Care Note  Patient: Shawn Bell  Procedure(s) Performed: OPEN RIGHT INGUINAL HERNIA REPAIR WITH INSERTION OF MESH (Right Inguinal)  Patient Location: PACU  Anesthesia Type:General  Level of Consciousness: awake, alert , oriented and patient cooperative  Airway & Oxygen Therapy: Patient Spontanous Breathing and Patient connected to face mask oxygen  Post-op Assessment: Report given to RN, Post -op Vital signs reviewed and stable and Patient moving all extremities  Post vital signs: Reviewed and stable  Last Vitals:  Vitals Value Taken Time  BP 144/82 05/07/2018 12:07 PM  Temp 36.5 C 05/07/2018 12:07 PM  Pulse 66 05/07/2018 12:10 PM  Resp 12 05/07/2018 12:10 PM  SpO2 100 % 05/07/2018 12:10 PM  Vitals shown include unvalidated device data.  Last Pain:  Vitals:   05/07/18 1005  TempSrc:   PainSc: 0-No pain         Complications: No apparent anesthesia complications

## 2018-05-07 NOTE — Anesthesia Procedure Notes (Signed)
Anesthesia Regional Block: TAP block   Pre-Anesthetic Checklist: ,, timeout performed, Correct Patient, Correct Site, Correct Laterality, Correct Procedure, Correct Position, site marked, Risks and benefits discussed, pre-op evaluation,  At surgeon's request and post-op pain management  Laterality: Right  Prep: Maximum Sterile Barrier Precautions used, chloraprep       Needles:  Injection technique: Single-shot  Needle Type: Echogenic Stimulator Needle     Needle Length: 9cm  Needle Gauge: 21     Additional Needles:   Narrative:  Start time: 05/07/2018 9:37 AM End time: 05/07/2018 9:40 AM Injection made incrementally with aspirations every 5 mL. Anesthesiologist: Brennan Bailey, MD  Additional Notes: Risks, benefits, and alternative discussed. Patient gave consent for procedure. Patient prepped and draped in sterile fashion. Sedation administered, patient remains easily responsive to voice. Relevant anatomy identified with ultrasound guidance. Local anesthetic given in 5cc increments with no signs or symptoms of intravascular injection. No pain or paraesthesias with injection. Patient monitored throughout procedure with signs of LAST or immediate complications. Tolerated well. Ultrasound image placed in chart. Tawny Asal, MD

## 2018-05-07 NOTE — Op Note (Signed)
Inguinal Hernia, Open, Procedure Note  Pre-operative Diagnosis:  Right inguinal hernia  Post-operative Diagnosis: same (sliding type)  Surgeon:  Earnstine Regal, MD, FACS  Anesthesia:  General  Preparation:  Chlora-prep  Estimated Blood Loss: minimal  Complications:  none  Indications: The patient presented with a right, reducible hernia.    Patient is self-referred for evaluation of new right inguinal hernia. Patient is known to my practice from previous cholecystectomy. He contacted our office over the past weekend with a new bulge in the right groin. After instruction from our surgeon, he was able to reduce the hernia. He initially had discomfort but no longer has any discomfort. He denies any signs or symptoms of obstruction. He has had no prior hernia repairs. His only prior abdominal surgery was cholecystectomy. He presents today for repair of RIH with mesh.  Procedure Details  The patient was evaluated in the holding area. All of the patient's questions were answered and the proposed procedure was confirmed. The site of the procedure was properly marked. The patient was taken to the Operating Room, identified by name, and the procedure verified as inguinal hernia repair.  The patient was placed in the supine position and underwent induction of anesthesia. A "Time Out" was performed per routine. The lower abdomen and groin were prepped and draped in the usual aseptic fashion.  After ascertaining that an adequate level of anesthesia had been obtained, an incision was made in the groin with a #10 blade.  Dissection was carried through the subcutaneous tissues and hemostasis obtained with the electrocautery.  A Gelpi retractor was placed for exposure.  The external oblique fascia was incised in line with it's fibers and extended through the external inguinal ring.  The cord structures were dissected out of the inguinal canal and encircled with a Penrose drain.  The floor of the  inguinal canal was dissected out.  There was some laxity to the floor but no fascial defect was identified.  The cord was explored and just lateral to the cord structures was a moderately large sliding type hernia, likely containing colon.  This was dissected out and reduced.  The cord was explored and there was no sign of an indirect hernia sac.  The internal ring was closed laterally with interrupted 0-Novofil sutures.  The floor of the inguinal canal was reconstructed with Ethicon Ultrapro mesh cut to the appropriate dimensions.  It was secured to the pubic tubercle with a 2-0 Novafil suture and along the inguinal ligament with a running 2-0 Novafil suture.  Mesh was split to accommodate the cord structures.  The superior margin of the mesh was secured to the transversalis and internal oblique musculature with interrupted 2-0 Novafil sutures.  The tails of the mesh were overlapped lateral to the cord structures and secured to the inguinal ligament with interrupted 2-0 Novafil sutures to recreate the internal inguinal ring.  Cord structures were returned to the inguinal canal.  Local anesthetic was infiltrated throughout the field.  External oblique fascia was closed with interrupted 3-0 Vicryl sutures.  Subcutaneous tissues were closed with interrupted 3-0 Vicryl sutures.  Skin was anesthetized with local anesthetic, and the skin edges were re-approximated with a running 4-0 Monocryl suture.  Wound was washed and dried and Dermabond was applied.  Instrument, sponge, and needle counts were correct prior to closure and at the conclusion of the case.  The patient tolerated the procedure well.  The patient was awakened from anesthesia and brought to the recovery room in  stable condition.  Armandina Gemma, MD [facility] Office: [phone]

## 2018-05-07 NOTE — Anesthesia Postprocedure Evaluation (Signed)
Anesthesia Post Note  Patient: Shawn Bell  Procedure(s) Performed: OPEN RIGHT INGUINAL HERNIA REPAIR WITH INSERTION OF MESH (Right Inguinal)     Patient location during evaluation: PACU Anesthesia Type: General Level of consciousness: awake and alert Pain management: pain level controlled Vital Signs Assessment: post-procedure vital signs reviewed and stable Respiratory status: spontaneous breathing, nonlabored ventilation and respiratory function stable Cardiovascular status: blood pressure returned to baseline and stable Postop Assessment: no apparent nausea or vomiting Anesthetic complications: no    Last Vitals:  Vitals:   05/07/18 1005 05/07/18 1207  BP: (!) 147/89 (!) 144/82  Pulse: 79   Resp: 17 12  Temp:  36.5 C  SpO2: 97% 100%    Last Pain:  Vitals:   05/07/18 1207  TempSrc:   PainSc: 0-No pain                 Brennan Bailey

## 2018-05-07 NOTE — Discharge Instructions (Signed)
Inguinal Hernia, Adult An inguinal hernia is when fat or the intestines push through the area where the leg meets the lower belly (groin) and make a rounded lump (bulge). This condition happens over time. There are three types of inguinal hernias. These types include:  Hernias that can be pushed back into the belly (are reducible).  Hernias that cannot be pushed back into the belly (are incarcerated).  Hernias that cannot be pushed back into the belly and lose their blood supply (get strangulated). This type needs emergency surgery.  Follow these instructions at home: Lifestyle  Drink enough fluid to keep your urine (pee) clear or pale yellow.  Eat plenty of fruits, vegetables, and whole grains. These have a lot of fiber. Talk with your doctor if you have questions.  Avoid lifting heavy objects.  Avoid standing for long periods of time.  Do not use tobacco products. These include cigarettes, chewing tobacco, or e-cigarettes. If you need help quitting, ask your doctor.  Try to stay at a healthy weight. General instructions  Do not try to force the hernia back in.  Watch your hernia for any changes in color or size. Let your doctor know if there are any changes.  Take over-the-counter and prescription medicines only as told by your doctor.  Keep all follow-up visits as told by your doctor. This is important. Contact a doctor if:  You have a fever.  You have new symptoms.  Your symptoms get worse. Get help right away if:  The area where the legs meets the lower belly has: ? Pain that gets worse suddenly. ? A bulge that gets bigger suddenly and does not go down. ? A bulge that turns red or purple. ? A bulge that is painful to the touch.  You are a man and your scrotum: ? Suddenly feels painful. ? Suddenly changes in size.  You feel sick to your stomach (nauseous) and this feeling does not go away.  You throw up (vomit) and this keeps happening.  You feel your heart  beating a lot more quickly than normal.  You cannot poop (have a bowel movement) or pass gas. This information is not intended to replace advice given to you by your health care provider. Make sure you discuss any questions you have with your health care provider. Document Released: 07/06/2006 Document Revised: 11/11/2015 Document Reviewed: 04/15/2014 Elsevier Interactive Patient Education  2018 [employer]  

## 2018-05-07 NOTE — Progress Notes (Signed)
Assisted Dr. Howze with right, ultrasound guided, transabdominal plane block. Side rails up, monitors on throughout procedure. See vital signs in flow sheet. Tolerated Procedure well. 

## 2018-05-07 NOTE — Anesthesia Procedure Notes (Signed)
Procedure Name: Intubation Date/Time: 05/07/2018 10:50 AM Performed by: Mitzie Na, CRNA Pre-anesthesia Checklist: Patient identified, Emergency Drugs available, Suction available, Patient being monitored and Timeout performed Patient Re-evaluated:Patient Re-evaluated prior to induction Oxygen Delivery Method: Circle system utilized Preoxygenation: Pre-oxygenation with 100% oxygen Induction Type: IV induction Ventilation: Mask ventilation without difficulty Laryngoscope Size: Mac and 4 Grade View: Grade I Tube type: Oral Tube size: 7.5 mm Number of attempts: 1 Airway Equipment and Method: Stylet Placement Confirmation: ETT inserted through vocal cords under direct vision,  positive ETCO2 and breath sounds checked- equal and bilateral Secured at: 24 cm Tube secured with: Tape Dental Injury: Teeth and Oropharynx as per pre-operative assessment

## 2018-05-08 ENCOUNTER — Encounter (HOSPITAL_COMMUNITY): Payer: Self-pay | Admitting: Surgery

## 2019-01-21 ENCOUNTER — Other Ambulatory Visit: Payer: Self-pay | Admitting: Surgery

## 2019-01-21 ENCOUNTER — Ambulatory Visit
Admission: RE | Admit: 2019-01-21 | Discharge: 2019-01-21 | Disposition: A | Discharge status: Home / Self Care | Payer: BC Managed Care – PPO | Source: Ambulatory Visit | Attending: Surgery | Admitting: Surgery

## 2019-01-21 ENCOUNTER — Other Ambulatory Visit: Payer: Self-pay

## 2019-01-21 DIAGNOSIS — R109 Unspecified abdominal pain: Secondary | ICD-10-CM | POA: Diagnosis not present

## 2019-01-21 DIAGNOSIS — Z9889 Other specified postprocedural states: Secondary | ICD-10-CM | POA: Diagnosis not present

## 2019-02-14 DIAGNOSIS — M25512 Pain in left shoulder: Secondary | ICD-10-CM | POA: Diagnosis not present

## 2019-02-14 DIAGNOSIS — Z96612 Presence of left artificial shoulder joint: Secondary | ICD-10-CM | POA: Diagnosis not present

## 2019-02-14 DIAGNOSIS — Z96611 Presence of right artificial shoulder joint: Secondary | ICD-10-CM | POA: Diagnosis not present

## 2019-02-14 DIAGNOSIS — Z471 Aftercare following joint replacement surgery: Secondary | ICD-10-CM | POA: Diagnosis not present

## 2019-03-03 DIAGNOSIS — Z8546 Personal history of malignant neoplasm of prostate: Secondary | ICD-10-CM | POA: Diagnosis not present

## 2019-03-03 DIAGNOSIS — N319 Neuromuscular dysfunction of bladder, unspecified: Secondary | ICD-10-CM | POA: Diagnosis not present

## 2019-03-03 DIAGNOSIS — R339 Retention of urine, unspecified: Secondary | ICD-10-CM | POA: Diagnosis not present

## 2019-03-03 DIAGNOSIS — R310 Gross hematuria: Secondary | ICD-10-CM | POA: Diagnosis not present

## 2019-03-17 ENCOUNTER — Other Ambulatory Visit: Payer: Self-pay

## 2019-03-17 ENCOUNTER — Encounter (HOSPITAL_COMMUNITY): Payer: Self-pay

## 2019-03-17 ENCOUNTER — Emergency Department (HOSPITAL_COMMUNITY)
Admission: EM | Admit: 2019-03-17 | Discharge: 2019-03-17 | Disposition: A | Discharge status: Home / Self Care | Payer: BC Managed Care – PPO | Attending: Emergency Medicine | Admitting: Emergency Medicine

## 2019-03-17 DIAGNOSIS — Z79899 Other long term (current) drug therapy: Secondary | ICD-10-CM | POA: Insufficient documentation

## 2019-03-17 DIAGNOSIS — I4891 Unspecified atrial fibrillation: Secondary | ICD-10-CM | POA: Diagnosis not present

## 2019-03-17 DIAGNOSIS — Z7982 Long term (current) use of aspirin: Secondary | ICD-10-CM | POA: Diagnosis not present

## 2019-03-17 LAB — CBC WITH DIFFERENTIAL/PLATELET
Abs Immature Granulocytes: 0.05 10*3/uL (ref 0.00–0.07)
Basophils Absolute: 0.1 10*3/uL (ref 0.0–0.1)
Basophils Relative: 1 %
Eosinophils Absolute: 0.2 10*3/uL (ref 0.0–0.5)
Eosinophils Relative: 2 %
HCT: 45.7 % (ref 39.0–52.0)
Hemoglobin: 15.1 g/dL (ref 13.0–17.0)
Immature Granulocytes: 1 %
Lymphocytes Relative: 17 %
Lymphs Abs: 1.2 10*3/uL (ref 0.7–4.0)
MCH: 32 pg (ref 26.0–34.0)
MCHC: 33 g/dL (ref 30.0–36.0)
MCV: 96.8 fL (ref 80.0–100.0)
Monocytes Absolute: 0.8 10*3/uL (ref 0.1–1.0)
Monocytes Relative: 11 %
Neutro Abs: 4.8 10*3/uL (ref 1.7–7.7)
Neutrophils Relative %: 68 %
Platelets: 224 10*3/uL (ref 150–400)
RBC: 4.72 MIL/uL (ref 4.22–5.81)
RDW: 12.4 % (ref 11.5–15.5)
WBC: 7.1 10*3/uL (ref 4.0–10.5)
nRBC: 0 % (ref 0.0–0.2)

## 2019-03-17 LAB — TROPONIN I (HIGH SENSITIVITY): Troponin I (High Sensitivity): 7 ng/L (ref <18)

## 2019-03-17 LAB — D-DIMER, QUANTITATIVE: D-Dimer, Quant: 0.37 ug/mL-FEU (ref 0.00–0.50)

## 2019-03-17 LAB — BASIC METABOLIC PANEL
Anion gap: 11 (ref 5–15)
BUN: 22 mg/dL (ref 8–23)
CO2: 22 mmol/L (ref 22–32)
Calcium: 9.4 mg/dL (ref 8.9–10.3)
Chloride: 108 mmol/L (ref 98–111)
Creatinine, Ser: 1.41 mg/dL — ABNORMAL HIGH (ref 0.61–1.24)
GFR calc Af Amer: 54 mL/min — ABNORMAL LOW (ref >60)
GFR calc non Af Amer: 47 mL/min — ABNORMAL LOW (ref >60)
Glucose, Bld: 102 mg/dL — ABNORMAL HIGH (ref 70–99)
Potassium: 4.3 mmol/L (ref 3.5–5.1)
Sodium: 141 mmol/L (ref 135–145)

## 2019-03-17 LAB — MAGNESIUM: Magnesium: 2.3 mg/dL (ref 1.7–2.4)

## 2019-03-17 MED ORDER — SODIUM CHLORIDE 0.9 % IV BOLUS: 1000.0000 mL | Freq: Once | INTRAVENOUS | Status: DC

## 2019-03-17 MED ORDER — ELIQUIS 5 MG VTE STARTER PACK: ORAL_TABLET | ORAL | 0 refills | Status: DC

## 2019-03-17 NOTE — ED Triage Notes (Signed)
Patient states he saw on his phone that he was having atrial fib while working out and checked it twice. Patient went to his clinic and was told to come to the ED.  Patient states he took ASA 81 mg x 3 tabs prior to coming to the ED.

## 2019-03-17 NOTE — Discharge Instructions (Addendum)
Recommend starting the blood thinner as discussed.  You should avoid other medicines that can increase your risk of bleeding while on this medication such as NSAIDs like ibuprofen.  Should you develop any palpitations, shortness of breath, recurrence of the atrial fibrillation with rapid heart rate, chest pain, please return immediately to the emergency department for reassessment.  If you have any significant falls, any head trauma, bleeding, please return to ER for recheck.

## 2019-03-17 NOTE — ED Notes (Signed)
Pt verbalized discharge instructions and follow up care. Alert and ambulatory. No IV. Has ride home 

## 2019-03-17 NOTE — ED Provider Notes (Addendum)
[facility] DEPT Provider Note   CSN: [id number] Arrival date & time: 03/17/19  1737     History   Chief Complaint Chief Complaint  Patient presents with  . atrial fib    HPI HOBSON SHOENFELT is a 78 y.o. male.  Presents emergency department with chief complaint of A. fib.  Patient states he was working out today when he noted a sensation of throat tightness.  He then checked his pulse using an app on his phone that had a rhythm strip which demonstrated heart rate of 130 atrial fibrillation.  Went to his primary doctor where they performed an EKG and was told he was in A. fib and told to go to the emergency department.  Patient states no longer having that sensation of throat tightness.  Denies any associated palpitations, no chest pain or difficulty breathing.  States he noted this couple days ago when he was working out but did not check his heart rate at the time.  Prior to that had never experienced any like this before.  Denies prior history of A. fib.  No prior history of DVT/PE.  No prior coronary artery disease history.  Does not drink alcohol on a regular basis.  No new medication changes recently.  Does endorse starting a supplement called blood flow 7 which on my review contains nitric oxide.      HPI  Past Medical History:  Diagnosis Date  . Arthritis    shoulders  . Cancer ([place])   . Diverticulosis of colon (without mention of hemorrhage)   . Hemorrhoids   . History of prostate cancer    S/P RADIATION [place]  . Hyperlipemia   . Intermittent self-catheterization of bladder    ongoing; has been self catherizing for 5 years without any issues   . Personal history of colonic polyps 01/23/2003    Patient Active Problem List   Diagnosis Date Noted  . Reducible right inguinal hernia 05/06/2018  . S/p reverse total shoulder arthroplasty 05/03/2017  . Weakness 07/03/2015  . Ataxia 07/03/2015  . Double vision 07/03/2015  . Hyperlipemia   . History  of prostate cancer   . Postop Hyponatremia 03/07/2012  . OA (osteoarthritis) of hip 03/06/2012  . Cholelithiasis with cholecystitis 01/30/2012  . Prostate cancer ([place]) 01/02/2012  . Lipid disorder 01/02/2012  . Heme positive stool 01/02/2012  . DIVERTICULOSIS-COLON 10/23/2008  . ANAL FISSURE 10/23/2008    Past Surgical History:  Procedure Laterality Date  . CATARACT EXTRACTION W/ INTRAOCULAR LENS  IMPLANT, BILATERAL    . CHOLECYSTECTOMY  02/07/2012   Procedure: LAPAROSCOPIC CHOLECYSTECTOMY WITH INTRAOPERATIVE CHOLANGIOGRAM;  Surgeon: Earnstine Regal, MD;  Location: [facility];  Service: General;  Laterality: N/A;  . INGUINAL HERNIA REPAIR Right 05/07/2018   Procedure: OPEN RIGHT INGUINAL HERNIA REPAIR WITH INSERTION OF MESH;  Surgeon: Armandina Gemma, MD;  Location: [facility];  Service: General;  Laterality: Right;  . JOINT REPLACEMENT    . REVERSE SHOULDER ARTHROPLASTY Left 05/03/2017   Procedure: REVERSE LEFT SHOULDER ARTHROPLASTY;  Surgeon: Justice Britain, MD;  Location: [facility];  Service: Orthopedics;  Laterality: Left;  . REVERSE SHOULDER ARTHROPLASTY Right 09/27/2017   Procedure: RIGHT REVERSE SHOULDER ARTHROPLASTY;  Surgeon: Justice Britain, MD;  Location: [facility];  Service: Orthopedics;  Laterality: Right;  . SHOULDER ARTHROSCOPY     BILATERAL  . TOTAL HIP ARTHROPLASTY     right  . TOTAL HIP ARTHROPLASTY  03/06/2012   Procedure: TOTAL HIP ARTHROPLASTY;  Surgeon: Dione Plover  Aluisio, MD;  Location: [facility];  Service: Orthopedics;  Laterality: Left;        Home Medications    Prior to Admission medications   Medication Sig Start Date End Date Taking? Authorizing Provider  Apoaequorin (PREVAGEN PO) Take 1 tablet by mouth daily.    [provider]  aspirin 81 MG tablet Take 81 mg by mouth daily.    [provider]  Coenzyme Q10 (COQ10) 200 MG CAPS Take 200 mg by mouth daily.    [provider]  Eliquis DVT/PE Starter Pack (ELIQUIS STARTER PACK) 5 MG TABS Take as directed  on package: start with two-5mg  tablets twice daily for 7 days. On day 8, switch to one-5mg  tablet twice daily. 03/17/19   Lucrezia Starch, MD  HYDROcodone-acetaminophen (NORCO/VICODIN) 5-325 MG tablet Take 1-2 tablets by mouth every 6 (six) hours as needed for moderate pain. 05/07/18   Armandina Gemma, MD  Multiple Vitamins-Minerals (CENTRUM SILVER ADULT 50+ PO) Take 1 tablet daily by mouth.     [provider]  naproxen (NAPROSYN) 500 MG tablet Take 1 tablet (500 mg total) by mouth 2 (two) times daily with a meal. Patient not taking: Reported on 04/25/2018 09/28/17   Shuford, Olivia Mackie, PA-C  oxyCODONE-acetaminophen (PERCOCET) 5-325 MG tablet Take 1 tablet by mouth every 4 (four) hours as needed. Patient not taking: Reported on 04/25/2018 09/28/17   Shuford, Olivia Mackie, PA-C  rosuvastatin (CRESTOR) 5 MG tablet Take 1 tablet (5 mg total) by mouth daily. Needs office visit and labs-2nd notice Patient taking differently: Take 5 mg daily by mouth.  12/23/12   Dunn, Areta Haber, PA-C  tiZANidine (ZANAFLEX) 4 MG capsule Take 1 capsule (4 mg total) by mouth 3 (three) times daily as needed for muscle spasms. Patient not taking: Reported on 04/25/2018 09/28/17   Shuford, Olivia Mackie, PA-C  zolpidem (AMBIEN) 10 MG tablet Take 5 mg at bedtime as needed by mouth for sleep.    [provider]    Family History Family History  Problem Relation Age of Onset  . Heart failure Mother   . Colon cancer Neg Hx   . Colon polyps Neg Hx   . Rectal cancer Neg Hx   . Stomach cancer Neg Hx     Social History Social History   Tobacco Use  . Smoking status: Never Smoker  . Smokeless tobacco: Never Used  Substance Use Topics  . Alcohol use: Yes    Comment: 1 drink per month  . Drug use: No     Allergies   Patient has no known allergies.   Review of Systems Review of Systems  Constitutional: Negative for chills and fever.  HENT: Negative for ear pain and sore throat.   Eyes: Negative for pain and visual  disturbance.  Respiratory: Negative for cough and shortness of breath.   Cardiovascular: Negative for chest pain and palpitations.  Gastrointestinal: Negative for abdominal pain and vomiting.  Genitourinary: Negative for dysuria and hematuria.  Musculoskeletal: Negative for arthralgias and back pain.  Skin: Negative for color change and rash.  Neurological: Negative for seizures and syncope.  All other systems reviewed and are negative.    Physical Exam Updated Vital Signs BP (!) 141/98   Pulse 69   Temp 98.5 F (36.9 C) (Oral)   Resp (!) 21   Ht 6\' 1"  (1.854 m)   Wt 93 kg   SpO2 97%   BMI 27.05 kg/m   Physical Exam Vitals signs and nursing note reviewed.  Constitutional:      Appearance: He is well-developed.  HENT:     Head: Normocephalic and atraumatic.  Eyes:     Conjunctiva/sclera: Conjunctivae normal.  Neck:     Musculoskeletal: Neck supple.  Cardiovascular:     Rate and Rhythm: Normal rate and regular rhythm.     Heart sounds: No murmur.  Pulmonary:     Effort: Pulmonary effort is normal. No respiratory distress.     Breath sounds: Normal breath sounds.  Abdominal:     Palpations: Abdomen is soft.     Tenderness: There is no abdominal tenderness.  Skin:    General: Skin is warm and dry.  Neurological:     Mental Status: He is alert.      ED Treatments / Results  Labs (all labs ordered are listed, but only abnormal results are displayed) Labs Reviewed  BASIC METABOLIC PANEL - Abnormal; Notable for the following components:      Result Value   Glucose, Bld 102 (*)    Creatinine, Ser 1.41 (*)    GFR calc non Af Amer 47 (*)    GFR calc Af Amer 54 (*)    All other components within normal limits  CBC WITH DIFFERENTIAL/PLATELET  MAGNESIUM  D-DIMER, QUANTITATIVE (NOT AT [facility])  TROPONIN I (HIGH SENSITIVITY)  TROPONIN I (HIGH SENSITIVITY)    EKG EKG Interpretation  Date/Time:  Monday March 17 2019 17:42:12 EDT Ventricular Rate:  114 PR  Interval:    QRS Duration: 102 QT Interval:  305 QTC Calculation: 420 R Axis:   -94 Text Interpretation:  Sinus tachycardia Ventricular bigeminy Consider right ventricular hypertrophy Probable inferior infarct, old Since last tracing PVC are new Confirmed by Daleen Bo [id number]) on 03/17/2019 5:47:20 PM   Radiology No results found.  Procedures Procedures (including critical care time)  Medications Ordered in ED Medications - No data to display   Initial Impression / Assessment and Plan / ED Course  I have reviewed the triage vital signs and the nursing notes.  Pertinent labs & imaging results that were available during my care of the patient were reviewed by me and considered in my medical decision making (see chart for details).  Clinical Course as of Mar 17 53  Mon Mar 17, 2019  2004 No ongoing symptoms, currently in sinus rhythm, will check labs, reassess   [RD]    Clinical Course User Index [RD] Lucrezia Starch, MD       78 year old male presents to ER after episode of A. fib.  Here patient had spontaneously returned to sinus rhythm.  He was asymptomatic, remained in sinus rhythm while in our emergency department.  EKG was without any obvious ischemic changes, initial troponin was within normal limits.  Dimer within normal limits.  Electrolytes within normal limits.  Recommended patient wait for a second troponin and additional observation however patient adamant about going home, states he is willing to follow-up with his primary doctor as well as cardiology. First time afib, has no major medical problems, but given his age he is at higher stroke risk - Chads2vasc score of 2.  I discussed risks and benefits of initiating anticoagulation.  Will will start patient on Eliquis.  I recommended patient follow-up with cardiology as well as his primary care doctor.  Placed order for ambulatory referral to A. fib clinic. Recommended stopping the new supplement he had started  recently. Reviewed strict return precautions, will discharge home.    After the discussed management above, the  patient was determined to be safe for discharge.  The patient was in agreement with this plan and all questions regarding their care were answered.  ED return precautions were discussed and the patient will return to the ED with any significant worsening of condition.    Final Clinical Impressions(s) / ED Diagnoses   Final diagnoses:  Atrial fibrillation, unspecified type [place])    ED Discharge Orders         Ordered    Amb Referral to AFIB Clinic     03/17/19 2131    Eliquis DVT/PE Starter Pack (ELIQUIS STARTER PACK) 5 MG TABS     03/17/19 2131           Lucrezia Starch, MD 03/18/19 [id number]    Lucrezia Starch, MD 03/19/19 [phone]

## 2019-03-26 ENCOUNTER — Ambulatory Visit: Payer: Self-pay | Admitting: Cardiology

## 2019-03-31 ENCOUNTER — Ambulatory Visit: Payer: BC Managed Care – PPO | Admitting: Cardiology

## 2019-03-31 ENCOUNTER — Ambulatory Visit: Payer: BC Managed Care – PPO

## 2019-03-31 ENCOUNTER — Other Ambulatory Visit: Payer: Self-pay

## 2019-03-31 ENCOUNTER — Encounter: Payer: Self-pay | Admitting: Cardiology

## 2019-03-31 VITALS — BP 110/74 | HR 89 | Ht 73.0 in | Wt 203.8 lb

## 2019-03-31 DIAGNOSIS — N1831 Chronic kidney disease, stage 3a: Secondary | ICD-10-CM | POA: Diagnosis not present

## 2019-03-31 DIAGNOSIS — I480 Paroxysmal atrial fibrillation: Secondary | ICD-10-CM

## 2019-03-31 DIAGNOSIS — E782 Mixed hyperlipidemia: Secondary | ICD-10-CM | POA: Diagnosis not present

## 2019-03-31 NOTE — Progress Notes (Signed)
Primary Physician:  Leonides Sake, MD   Patient ID: Shawn Bell, male    DOB: 1941-03-17, 78 y.o.   MRN: [id number]  Subjective:    Chief Complaint  Patient presents with  . Atrial Fibrillation  . New Patient (Initial Visit)    HPI: Shawn Bell  is a 78 y.o. male  with hyperlipidemia, history of prostate cancer status post radiation 20 years ago, referred to [place] for evaluation of paroxysmal atrial fibrillation.  Patient was recently exercising and was notified by his apple watch to be in A. fib.  He was seen urgently by his PCP who confirmed A. fib by EKG and was sent to the emergency room.  On presentation to the emergency room, he had spontaneously converted.  He had correlating symptoms of throat tightness.  He is without history of known atrial fibrillation.  Work-up in the emergency room was unyielding, negative troponin and negative D-dimer.  In view of his chads Vascor, he was started on Eliquis.  Recommended outpatient cardiology follow-up.  He reportedly started "bloodflow-7" 3-4 days prior to his episodes of A fib. He reports only taking Eliquis for 5 days, then decided to just continue with ASA. He has not had any known recurrence of A fib. No history of hypertension.  He does exercise regularly. He underwent bilateral shoulder surgeries this year.   Past Medical History:  Diagnosis Date  . Arthritis    shoulders  . Cancer ([place])   . Diverticulosis of colon (without mention of hemorrhage)   . Hemorrhoids   . History of prostate cancer    S/P RADIATION [place]  . Hyperlipemia   . Intermittent self-catheterization of bladder    ongoing; has been self catherizing for 5 years without any issues   . Personal history of colonic polyps 01/23/2003    Past Surgical History:  Procedure Laterality Date  . CATARACT EXTRACTION W/ INTRAOCULAR LENS  IMPLANT, BILATERAL    . CHOLECYSTECTOMY  02/07/2012   Procedure: LAPAROSCOPIC CHOLECYSTECTOMY WITH INTRAOPERATIVE CHOLANGIOGRAM;   Surgeon: Earnstine Regal, MD;  Location: [facility];  Service: General;  Laterality: N/A;  . INGUINAL HERNIA REPAIR Right 05/07/2018   Procedure: OPEN RIGHT INGUINAL HERNIA REPAIR WITH INSERTION OF MESH;  Surgeon: Armandina Gemma, MD;  Location: [facility];  Service: General;  Laterality: Right;  . JOINT REPLACEMENT    . REVERSE SHOULDER ARTHROPLASTY Left 05/03/2017   Procedure: REVERSE LEFT SHOULDER ARTHROPLASTY;  Surgeon: Justice Britain, MD;  Location: [facility];  Service: Orthopedics;  Laterality: Left;  . REVERSE SHOULDER ARTHROPLASTY Right 09/27/2017   Procedure: RIGHT REVERSE SHOULDER ARTHROPLASTY;  Surgeon: Justice Britain, MD;  Location: [facility];  Service: Orthopedics;  Laterality: Right;  . SHOULDER ARTHROSCOPY     BILATERAL  . TOTAL HIP ARTHROPLASTY     right  . TOTAL HIP ARTHROPLASTY  03/06/2012   Procedure: TOTAL HIP ARTHROPLASTY;  Surgeon: Gearlean Alf, MD;  Location[facility];  Service: Orthopedics;  Laterality: Left;    Social History   Socioeconomic History  . Marital status: Married    Spouse name: Not on file  . Number of children: 2  . Years of education: Not on file  . Highest education level: Not on file  Occupational History  . Occupation: Information systems manager: [employer]  Social Needs  . Financial resource strain: Not on file  . Food insecurity    Worry: Not on file    Inability: Not on file  .  Transportation needs    Medical: Not on file    Non-medical: Not on file  Tobacco Use  . Smoking status: Never Smoker  . Smokeless tobacco: Never Used  Substance and Sexual Activity  . Alcohol use: Yes    Comment: rarely  . Drug use: No  . Sexual activity: Not on file  Lifestyle  . Physical activity    Days per week: Not on file    Minutes per session: Not on file  . Stress: Not on file  Relationships  . Social Herbalist on phone: Not on file    Gets together: Not on file    Attends religious service: Not on file    Active member of club or organization: Not on  file    Attends meetings of clubs or organizations: Not on file    Relationship status: Not on file  . Intimate partner violence    Fear of current or ex partner: Not on file    Emotionally abused: Not on file    Physically abused: Not on file    Forced sexual activity: Not on file  Other Topics Concern  . Not on file  Social History Narrative  . Not on file    Review of Systems  Constitution: Negative for decreased appetite, malaise/fatigue, weight gain and weight loss.  Eyes: Negative for visual disturbance.  Cardiovascular: Negative for chest pain, claudication, dyspnea on exertion, leg swelling, orthopnea, palpitations and syncope.  Respiratory: Negative for hemoptysis and wheezing.   Endocrine: Negative for cold intolerance and heat intolerance.  Hematologic/Lymphatic: Does not bruise/bleed easily.  Skin: Negative for nail changes.  Musculoskeletal: Negative for muscle weakness and myalgias.  Gastrointestinal: Negative for abdominal pain, change in bowel habit, nausea and vomiting.  Neurological: Negative for difficulty with concentration, dizziness, focal weakness and headaches.  Psychiatric/Behavioral: Negative for altered mental status and suicidal ideas.  All other systems reviewed and are negative.     Objective:  Blood pressure 110/74, pulse 89, height '6\' 1"'$  (1.854 m), weight 203 lb 12.8 oz (92.4 kg), SpO2 96 %. Body mass index is 26.89 kg/m.    Physical Exam  Constitutional: He is oriented to person, place, and time. Vital signs are normal. He appears well-developed and well-nourished.  HENT:  Head: Normocephalic and atraumatic.  Neck: Normal range of motion.  Cardiovascular: Normal rate, regular rhythm, normal heart sounds and intact distal pulses.  Pulses:      Femoral pulses are 2+ on the right side and 2+ on the left side.      Popliteal pulses are 2+ on the right side and 2+ on the left side.       Dorsalis pedis pulses are 1+ on the right side and 1+ on the  left side.       Posterior tibial pulses are 2+ on the right side and 2+ on the left side.  Pulmonary/Chest: Effort normal and breath sounds normal. No accessory muscle usage. No respiratory distress.  Abdominal: Soft. Bowel sounds are normal.  Musculoskeletal: Normal range of motion.  Neurological: He is alert and oriented to person, place, and time.  Skin: Skin is warm and dry.  Vitals reviewed.  Radiology: No results found.  Laboratory examination:   03/24/2019: Glucose 127, Creatinine 1.37, eGFR 48, Potassium 4.4, CMP otherwise normal.  CMP Latest Ref Rng & Units 03/17/2019 05/01/2017 07/03/2015  Glucose 70 - 99 mg/dL 102(H) 86 106(H)  BUN 8 - 23 mg/dL '22 12 17  '$ Creatinine  0.61 - 1.24 mg/dL 1.41(H) 1.06 1.01  Sodium 135 - 145 mmol/L 141 137 140  Potassium 3.5 - 5.1 mmol/L 4.3 4.6 3.7  Chloride 98 - 111 mmol/L 108 104 107  CO2 22 - 32 mmol/L '22 24 25  '$ Calcium 8.9 - 10.3 mg/dL 9.4 9.6 8.8(L)  Total Protein 6.0 - 8.3 g/dL - - -  Total Bilirubin 0.3 - 1.2 mg/dL - - -  Alkaline Phos 39 - 117 U/L - - -  AST 0 - 37 U/L - - -  ALT 0 - 53 U/L - - -   CBC Latest Ref Rng & Units 03/17/2019 05/02/2018 09/17/2017  WBC 4.0 - 10.5 K/uL 7.1 6.4 6.7  Hemoglobin 13.0 - 17.0 g/dL 15.1 15.1 16.0  Hematocrit 39.0 - 52.0 % 45.7 45.4 47.4  Platelets 150 - 400 K/uL 224 209 250   Lipid Panel     Component Value Date/Time   CHOL 160 07/04/2015 0618   TRIG 168 (H) 07/04/2015 0618   HDL 35 (L) 07/04/2015 0618   CHOLHDL 4.6 07/04/2015 0618   VLDL 34 07/04/2015 0618   LDLCALC 91 07/04/2015 0618   HEMOGLOBIN A1C Lab Results  Component Value Date   HGBA1C 5.3 07/04/2015   MPG 105 07/04/2015   TSH No results for input(s): TSH in the last 8760 hours.  PRN Meds:. Medications Discontinued During This Encounter  Medication Reason  . Eliquis DVT/PE Starter Pack (ELIQUIS STARTER PACK) 5 MG TABS Error  . HYDROcodone-acetaminophen (NORCO/VICODIN) 5-325 MG tablet Error  . naproxen (NAPROSYN) 500 MG  tablet Error  . oxyCODONE-acetaminophen (PERCOCET) 5-325 MG tablet Error  . tiZANidine (ZANAFLEX) 4 MG capsule Error   Current Meds  Medication Sig  . Apoaequorin (PREVAGEN PO) Take 1 tablet by mouth daily.  Marland Kitchen aspirin 81 MG tablet Take 81 mg by mouth daily.  . Coenzyme Q10 (COQ10) 200 MG CAPS Take 200 mg by mouth daily.  . Multiple Vitamins-Minerals (CENTRUM SILVER ADULT 50+ PO) Take 1 tablet daily by mouth.   . rosuvastatin (CRESTOR) 5 MG tablet Take 1 tablet (5 mg total) by mouth daily. Needs office visit and labs-2nd notice (Patient taking differently: Take 5 mg daily by mouth. )  . zolpidem (AMBIEN) 10 MG tablet Take 5 mg at bedtime as needed by mouth for sleep.    Cardiac Studies:     Assessment:   Paroxysmal atrial fibrillation (HCC) - Plan: EKG 12-Lead, PCV CARDIAC STRESS TEST, PCV ECHOCARDIOGRAM COMPLETE, Pulse oximetry, overnight, Cardiac event monitor  Mixed hyperlipidemia  Stage 3a chronic kidney disease  EKG 03/31/2019: Normal sinus rhythm at 81 bpm, left atrial enlargement, IRBBB. No evidence of ischemia.  CHA2DS2-VASCScore: Risk Score  2,  Yearly risk of stroke  2.2%. Recommendation: Anticoagulation     Recommendations:   Patient is referred to [place] for evaluation of paroxysmal atrial fibrillation.  Patient has had 1 episode of A. fib that was symptomatic with throat tightness that occurred while exercising.  While his episode of A. fib potentially be lone A. fib, he will need further evaluation.  I recommend placing him on 30-day event monitor to exclude paroxysmal episodes.  He will also need echocardiogram to exclude any structural normalities given evaluation of left atrial size.  He is without history of sleep apnea, but given his episode of A. fib, will obtain nocturnal oximetry for further evaluation of this.  He is very active, without any exertional difficulty, but does have hyperlipidemia and will need ischemic evaluation.  Will obtain treadmill  stress testing.   CVA risk associated with A. fib was explained to the patient.  He does have a CHA2DS2-VASc score of 2, and difficult to determine if he will need lifelong anticoagulation given that he has only had one episode of A. fib.  After risk and benefits were discussed with the patient, we have mutually decided to have him undergo further testing first before deciding on anticoagulation.  He will continue with aspirin therapy for now.  His risk of CVA was explained.  We will see him back after the test for further recommendations and evaluation.   *I have discussed this case with Dr. Einar Gip and he personally examined the patient and participated in formulating the plan.*   Miquel Dunn, MSN, APRN, FNP-C [facility]. [place] Office: [phone] Fax: [phone]

## 2019-04-01 ENCOUNTER — Ambulatory Visit (INDEPENDENT_AMBULATORY_CARE_PROVIDER_SITE_OTHER): Payer: BC Managed Care – PPO

## 2019-04-01 ENCOUNTER — Ambulatory Visit: Payer: BC Managed Care – PPO

## 2019-04-01 DIAGNOSIS — I480 Paroxysmal atrial fibrillation: Secondary | ICD-10-CM

## 2019-04-02 ENCOUNTER — Telehealth: Payer: Self-pay

## 2019-04-02 NOTE — Progress Notes (Signed)
Normal heart function. Moderate regurgitation (leakage) on aortic valve. Will discuss at office visit.

## 2019-04-02 NOTE — Telephone Encounter (Signed)
Spoke with patient concerning results. Patient verbalized understanding.

## 2019-04-02 NOTE — Telephone Encounter (Signed)
-----   Message from Miquel Dunn, NP sent at 04/02/2019  8:01 AM EDT ----- Normal heart function. Moderate regurgitation (leakage) on aortic valve. Will discuss at office visit.

## 2019-04-04 NOTE — Progress Notes (Signed)
Called patient with results. Patient verbalized understanding. 

## 2019-04-14 ENCOUNTER — Encounter: Payer: Self-pay | Admitting: Cardiology

## 2019-04-14 NOTE — Progress Notes (Signed)
Patient noted to have 4 beats of NSVT in early morning hours. No reported symptoms. Will continue to monitor. May consider sleep study.

## 2019-05-14 ENCOUNTER — Other Ambulatory Visit: Payer: Self-pay

## 2019-05-14 ENCOUNTER — Telehealth: Payer: Self-pay

## 2019-05-14 DIAGNOSIS — I480 Paroxysmal atrial fibrillation: Secondary | ICD-10-CM

## 2019-05-14 NOTE — Telephone Encounter (Signed)
Patient called and said that he has no received his monitor results

## 2019-05-14 NOTE — Telephone Encounter (Signed)
I just received them a few days ago. He did not have any episodes of A fib. I will discuss the results in detail at his follow up.

## 2019-05-14 NOTE — Telephone Encounter (Signed)
S/w patient advised him of results

## 2019-05-19 ENCOUNTER — Ambulatory Visit: Payer: BC Managed Care – PPO | Admitting: Cardiology

## 2019-05-26 ENCOUNTER — Ambulatory Visit: Payer: BC Managed Care – PPO | Admitting: Cardiology

## 2019-06-02 ENCOUNTER — Ambulatory Visit (INDEPENDENT_AMBULATORY_CARE_PROVIDER_SITE_OTHER): Payer: BC Managed Care – PPO

## 2019-06-02 ENCOUNTER — Other Ambulatory Visit: Payer: BC Managed Care – PPO

## 2019-06-02 ENCOUNTER — Other Ambulatory Visit: Payer: Self-pay

## 2019-06-02 DIAGNOSIS — I480 Paroxysmal atrial fibrillation: Secondary | ICD-10-CM

## 2019-06-03 NOTE — Progress Notes (Signed)
Patient is aware of stress test results. [place], [place]

## 2019-06-04 ENCOUNTER — Telehealth: Payer: Self-pay | Admitting: Cardiology

## 2019-06-04 NOTE — Telephone Encounter (Signed)
I discussed with the patient regarding his studies, specifically I reviewed the event monitor, which shows brief atrial tachycardia but no atrial fibrillation.  However he has had episodes of brief NSVT especially at night.  He needs a sleep study.  On discussion with the patient, he states that he actually purchased a CPAP machine which is "top of the line" and is auto titration facility and he had been using this on a regular basis until 3 months ago.  States that he will go back to using it and he thinks it with his smartphone.  I gave him the option of referring him to be evaluated by sleep medicine, he vehemently does not want any further evaluation.  I requested that he see me back in the office in 6 months for follow-up and continuity of care.  He is willing to do this.

## 2019-06-23 ENCOUNTER — Ambulatory Visit: Payer: BC Managed Care – PPO | Admitting: Cardiology

## 2019-06-26 ENCOUNTER — Telehealth: Payer: BC Managed Care – PPO | Admitting: Cardiology

## 2019-09-18 DIAGNOSIS — K4090 Unilateral inguinal hernia, without obstruction or gangrene, not specified as recurrent: Secondary | ICD-10-CM | POA: Diagnosis not present

## 2019-12-03 ENCOUNTER — Ambulatory Visit: Payer: BC Managed Care – PPO | Admitting: Cardiology

## 2019-12-18 DIAGNOSIS — I4510 Unspecified right bundle-branch block: Secondary | ICD-10-CM

## 2019-12-18 HISTORY — DX: Unspecified right bundle-branch block: I45.10

## 2019-12-29 ENCOUNTER — Other Ambulatory Visit: Payer: Self-pay

## 2019-12-29 ENCOUNTER — Encounter: Payer: Self-pay | Admitting: Cardiology

## 2019-12-29 ENCOUNTER — Ambulatory Visit: Payer: BC Managed Care – PPO | Admitting: Cardiology

## 2019-12-29 VITALS — BP 128/77 | HR 77 | Resp 15 | Ht 73.0 in | Wt 208.6 lb

## 2019-12-29 DIAGNOSIS — N1831 Chronic kidney disease, stage 3a: Secondary | ICD-10-CM | POA: Diagnosis not present

## 2019-12-29 DIAGNOSIS — E782 Mixed hyperlipidemia: Secondary | ICD-10-CM

## 2019-12-29 DIAGNOSIS — Z8679 Personal history of other diseases of the circulatory system: Secondary | ICD-10-CM

## 2019-12-29 NOTE — Progress Notes (Signed)
Primary Physician:  Geannie Risen, MD   Patient ID: Shawn Bell, male    DOB: 20-Jun-1940, 79 y.o.   MRN: [id number]  Subjective:    Chief Complaint  Patient presents with   Follow-up    6 month   Atrial Fibrillation    HPI: Shawn Bell  is a 79 y.o. male  with hyperlipidemia, history of prostate cancer status post radiation 20 years ago, last seen 6 months ago when he has one episode of atrial fibrillation, while he was exercising his apple watch stated A. fib.  Atrial fibrillation was confirmed by his PCP and he was sent to the emergency room and upon presentation he had converted to sinus rhythm.  Symptom correlation included throat tightness.  He reportedly started "bloodflow-7" 3-4 days prior to his episodes of A fib. He reports only taking Eliquis for 5 days, then decided to just continue with ASA. He has not had any known recurrence of A fib. No history of hypertension.  He remains asymptomatic and he has not had any recurrence of symptoms since I last saw him 6 months ago.  Past Medical History:  Diagnosis Date   Arthritis    shoulders   Cancer ([place])    Diverticulosis of colon (without mention of hemorrhage)    Hemorrhoids    History of prostate cancer    S/P RADIATION [place]   Hyperlipemia    Intermittent self-catheterization of bladder    ongoing; has been self catherizing for 5 years without any issues    Personal history of colonic polyps 01/23/2003    Past Surgical History:  Procedure Laterality Date   CATARACT EXTRACTION W/ INTRAOCULAR LENS  IMPLANT, BILATERAL     CHOLECYSTECTOMY  02/07/2012   Procedure: LAPAROSCOPIC CHOLECYSTECTOMY WITH INTRAOPERATIVE CHOLANGIOGRAM;  Surgeon: Earnstine Regal, MD;  [facility];  Service: General;  Laterality: N/A;   INGUINAL HERNIA REPAIR Right 05/07/2018   Procedure: OPEN RIGHT INGUINAL HERNIA REPAIR WITH INSERTION OF MESH;  Surgeon: Armandina Gemma, MD;  [facility];  Service: General;  Laterality: Right;     JOINT REPLACEMENT     REVERSE SHOULDER ARTHROPLASTY Left 05/03/2017   Procedure: REVERSE LEFT SHOULDER ARTHROPLASTY;  Surgeon: Justice Britain, MD;  Location: [facility];  Service: Orthopedics;  Laterality: Left;   REVERSE SHOULDER ARTHROPLASTY Right 09/27/2017   Procedure: RIGHT REVERSE SHOULDER ARTHROPLASTY;  Surgeon: Justice Britain, MD;  Location: [facility];  Service: Orthopedics;  Laterality: Right;   SHOULDER ARTHROSCOPY     BILATERAL   TOTAL HIP ARTHROPLASTY     right   TOTAL HIP ARTHROPLASTY  03/06/2012   Procedure: TOTAL HIP ARTHROPLASTY;  Surgeon: Gearlean Alf, MD;  [facility];  Service: Orthopedics;  Laterality: Left;   Social History   Tobacco Use   Smoking status: Never Smoker   Smokeless tobacco: Never Used  Substance Use Topics   Alcohol use: Yes    Comment: rarely   Marital Status: Married   Review of Systems  Cardiovascular: Negative for chest pain, dyspnea on exertion and leg swelling.  Gastrointestinal: Negative for melena.   Objective:  Blood pressure 128/77, pulse 77, resp. rate 15, height '6\' 1"'$  (1.854 m), weight 208 lb 9.6 oz (94.6 kg), SpO2 95 %. Body mass index is 27.52 kg/m.    Physical Exam Vitals reviewed.  Constitutional:      Appearance: He is well-developed.  Cardiovascular:     Rate and Rhythm: Normal rate and regular rhythm.  Pulses: Intact distal pulses.          Femoral pulses are 2+ on the right side and 2+ on the left side.      Popliteal pulses are 2+ on the right side and 2+ on the left side.       Dorsalis pedis pulses are 1+ on the right side and 1+ on the left side.       Posterior tibial pulses are 2+ on the right side and 2+ on the left side.     Heart sounds: Normal heart sounds.  Pulmonary:     Effort: Pulmonary effort is normal. No accessory muscle usage or respiratory distress.     Breath sounds: Normal breath sounds.  Abdominal:     General: Bowel sounds are normal.     Palpations: Abdomen is soft.     Radiology: No results found.  Laboratory examination:   03/24/2019: Glucose 127, Creatinine 1.37, eGFR 48, Potassium 4.4, CMP otherwise normal.  CMP Latest Ref Rng & Units 03/17/2019 05/01/2017 07/03/2015  Glucose 70 - 99 mg/dL 102(H) 86 106(H)  BUN 8 - 23 mg/dL '22 12 17  '$ Creatinine 0.61 - 1.24 mg/dL 1.41(H) 1.06 1.01  Sodium 135 - 145 mmol/L 141 137 140  Potassium 3.5 - 5.1 mmol/L 4.3 4.6 3.7  Chloride 98 - 111 mmol/L 108 104 107  CO2 22 - 32 mmol/L '22 24 25  '$ Calcium 8.9 - 10.3 mg/dL 9.4 9.6 8.8(L)  Total Protein 6.0 - 8.3 g/dL - - -  Total Bilirubin 0.3 - 1.2 mg/dL - - -  Alkaline Phos 39 - 117 U/L - - -  AST 0 - 37 U/L - - -  ALT 0 - 53 U/L - - -   CBC Latest Ref Rng & Units 03/17/2019 05/02/2018 09/17/2017  WBC 4.0 - 10.5 K/uL 7.1 6.4 6.7  Hemoglobin 13.0 - 17.0 g/dL 15.1 15.1 16.0  Hematocrit 39 - 52 % 45.7 45.4 47.4  Platelets 150 - 400 K/uL 224 209 250   Lipid Panel     Component Value Date/Time   CHOL 160 07/04/2015 0618   TRIG 168 (H) 07/04/2015 0618   HDL 35 (L) 07/04/2015 0618   CHOLHDL 4.6 07/04/2015 0618   VLDL 34 07/04/2015 0618   LDLCALC 91 07/04/2015 0618   HEMOGLOBIN A1C Lab Results  Component Value Date   HGBA1C 5.3 07/04/2015   MPG 105 07/04/2015   TSH No results for input(s): TSH in the last 8760 hours.  PRN Meds:. There are no discontinued medications. Current Meds  Medication Sig   Apoaequorin (PREVAGEN PO) Take 1 tablet by mouth daily.   aspirin 81 MG tablet Take 81 mg by mouth daily.   Coenzyme Q10 (COQ10) 200 MG CAPS Take 200 mg by mouth daily.   Multiple Vitamins-Minerals (CENTRUM SILVER ADULT 50+ PO) Take 1 tablet daily by mouth.    rosuvastatin (CRESTOR) 5 MG tablet Take 1 tablet (5 mg total) by mouth daily. Needs office visit and labs-2nd notice (Patient taking differently: Take 5 mg daily by mouth. )   zolpidem (AMBIEN) 10 MG tablet Take 5 mg at bedtime as needed by mouth for sleep.    Cardiac Studies:   Echocardiogram  04/01/2019:  Left ventricle cavity is normal in size. Mild concentric hypertrophy of  the left ventricle. Normal LV systolic function with EF 60%. Normal global  wall motion. Doppler evidence of grade I (impaired) diastolic dysfunction,  normal LAP.  Left atrial cavity is normal in size.  Trileaflet aortic valve.  Trace aortic valve stenosis. Moderate (Grade II)  aortic regurgitation.  Mild tricuspid regurgitation.  Trace pulmonic regurgitation. No evidence of pulmonary hypertension.   30-day event monitor 10/13-11/04/2019:  Normal sinus rhythm. 1 patient activated event without reported symptoms correlated with sinus rhythm with PVCs. 2 auto detected events occurred. One episode on 10/26 at 1221 AM for normal sinus rhythm with 4 beats of NSVT. No symptoms. 1 episode of sinus bradycardia with 4 beats of atrial tachycardia at 165 bpm on day 12 at 11:18 PM. No atrial fibrillation was noted.  Lexiscan Tetrofosmin Stress Test  06/02/2019: Nondiagnostic ECG stress due to [facility] pharmacologic stress.  Normal myocardial perfusion. All segments of left ventricle demonstrated normal wall motion and thickening. No stress lung uptake. Calculated Stress LV EF is mildly dysfunctional 48%. however appears normal visually.  No previous exam available for comparison. Low risk study.   EKG:   EKG 12/29/2019: Normal sinus rhythm with rate of 77 bpm, normal axis.  Incomplete right bundle branch block.  Normal EKG. No significant change from EKG 03/31/2019.  Assessment:     ICD-10-CM   1. Mixed hyperlipidemia  E78.2 EKG 12-Lead  2. History of atrial fibrillation  Z86.79   3. Stage 3a chronic kidney disease  N18.31     CHA2DS2-VASCScore: Risk Score  2,  Yearly risk of stroke  2.2%. Recommendation: Anticoagulation   Recommendations:   DONTEA CORLEW  is a 79 y.o. male  with hyperlipidemia, history of prostate cancer status post radiation 20 years ago, last seen 6 months ago when he has one episode of  atrial fibrillation, while he was exercising his apple watch stated A. fib.  Atrial fibrillation was confirmed by his PCP and he was sent to the emergency room and upon presentation he had converted to sinus rhythm.  Symptom correlation included throat tightness. He reportedly started "bloodflow-7" , a supplement about 3-4 days prior to his episodes of A fib.  He has not had any known recurrence of A fib.   His lipids are well controlled and is on a low-dose statin.  Renal function is stable.  Blood pressure is normal.  He is presently asymptomatic, continues to exercise regularly, his echocardiogram and nuclear stress test discussed with the patient.  He prefers not to be on anticoagulation.  He is stable from cardiac standpoint, I will see him back on a as needed basis.   Adrian Prows, MD, [facility] 12/29/2019, 12:40 PM Office: [phone]

## 2020-01-20 ENCOUNTER — Ambulatory Visit: Payer: Self-pay | Admitting: Surgery

## 2020-01-20 DIAGNOSIS — K4090 Unilateral inguinal hernia, without obstruction or gangrene, not specified as recurrent: Secondary | ICD-10-CM | POA: Diagnosis not present

## 2020-02-25 ENCOUNTER — Encounter (HOSPITAL_COMMUNITY): Payer: Self-pay

## 2020-02-25 NOTE — Progress Notes (Addendum)
COVID Vaccine Completed: Date COVID Vaccine completed: COVID vaccine manufacturer: [employer]   PCP - Geannie Risen, MD Cardiologist - Adrian Prows, MD.  Last OV 12-29-19  Chest x-ray -  EKG - 12-29-19 in Epic Stress Test - 06-02-19 in Epic ECHO - 04-01-19 in Epic Cardiac Cath -  Event Monitor - 04-01-19 in Epic  Sleep Study -  CPAP -   Fasting Blood Sugar -  Checks Blood Sugar _____ times a day  Blood Thinner Instructions: Aspirin Instructions:  ASA 81 mg Last Dose:  Anesthesia review: Mod aortic regurgitation, trace aortic stenosis.  Afib episode x1   Patient denies shortness of breath, fever, cough and chest pain at PAT appointment   Patient verbalized understanding of instructions that were given to them at the PAT appointment. Patient was also instructed that they will need to review over the PAT instructions again at home before surgery.

## 2020-02-26 NOTE — Patient Instructions (Addendum)
DUE TO COVID-19 ONLY ONE VISITOR IS ALLOWED TO COME WITH YOU AND STAY IN THE WAITING ROOM ONLY DURING  PRE OP AND PROCEDURE.   IF YOU WILL BE ADMITTED INTO THE HOSPITAL YOU ARE ALLOWED ONE SUPPORT PERSON DURING VISITATION HOURS  ONLY (10AM -8PM)   . The support person may change daily. . The support person must pass our screening, gel in and out, and wear a mask at all times, including in the patient's room. . Patients must also wear a mask when staff or their support person are in the room.   COVID SWAB TESTING MUST BE COMPLETED ON:  Tuesday, 03-09-20 @ 9:30 AM    [address] [place], [place] [address]  (Must self quarantine after testing. Follow instructions on handout.)        Your procedure is scheduled on: Friday, 03-12-20   Report to [facility] Main  Entrance   Report to Short Stay at 5:30 AM   [facility])      Call this number if you have problems the morning of surgery [phone]     Do not eat solid food :After Midnight.   May have liquids until 4:30 AM day of surgery   CLEAR LIQUID DIET  Foods Allowed                                                                     Foods Excluded  Water, Black Coffee and tea, regular and decaf         liquids that you cannot  Plain Jell-O in any flavor  (No red)                            see through such as: Fruit ices (not with fruit pulp)                                      milk, soups, orange juice              Iced Popsicles (No red)                                      All solid food                                   Apple juices Sports drinks like Gatorade (No red) Lightly seasoned clear broth or consume(fat free)  Sugar, honey syrup   Oral Hygiene is also important to reduce your risk of infection.                                    Remember - BRUSH YOUR TEETH THE MORNING OF SURGERY WITH YOUR REGULAR TOOTHPASTE   Do NOT smoke after Midnight   Take these medicines the morning of surgery with A SIP OF  WATER:   None  You may not have any metal on your body including jewelry, and body piercings             Do not wear lotions, powders, perfumes/cologne, or deodorant             Men may shave face and neck.   Do not bring valuables to the hospital. [place].   Contacts, dentures or bridgework may not be worn into surgery.     Patients discharged the day of surgery will not be allowed to drive home.                Please read over the following fact sheets you were given: IF YOU HAVE QUESTIONS ABOUT YOUR PRE OP INSTRUCTIONS  PLEASE CALL [phone]   [facility] - Preparing for Surgery Before surgery, you can play an important role.  Because skin is not sterile, your skin needs to be as free of germs as possible.  You can reduce the number of germs on your skin by washing with CHG (chlorahexidine gluconate) soap before surgery.  CHG is an antiseptic cleaner which kills germs and bonds with the skin to continue killing germs even after washing. Please DO NOT use if you have an allergy to CHG or antibacterial soaps.  If your skin becomes reddened/irritated stop using the CHG and inform your nurse when you arrive at Short Stay. Do not shave (including legs and underarms) for at least 48 hours prior to the first CHG shower.  You may shave your face/neck.  Please follow these instructions carefully:  1.  Shower with CHG Soap the night before surgery and the  morning of surgery.  2.  If you choose to wash your hair, wash your hair first as usual with your normal  shampoo.  3.  After you shampoo, rinse your hair and body thoroughly to remove the shampoo.                             4.  Use CHG as you would any other liquid soap.  You can apply chg directly to the skin and wash.  Gently with a scrungie or clean washcloth.  5.  Apply the CHG Soap to your body ONLY FROM THE NECK DOWN.   Do   not use on face/ open                           Wound or  open sores. Avoid contact with eyes, ears mouth and   genitals (private parts).                       Wash face,  Genitals (private parts) with your normal soap.             6.  Wash thoroughly, paying special attention to the area where your    surgery  will be performed.  7.  Thoroughly rinse your body with warm water from the neck down.  8.  DO NOT shower/wash with your normal soap after using and rinsing off the CHG Soap.                9.  Pat yourself dry with a clean towel.            10.  Wear clean pajamas.  11.  Place clean sheets on your bed the night of your first shower and do not  sleep with pets. Day of Surgery : Do not apply any lotions/deodorants the morning of surgery.  Please wear clean clothes to the hospital/surgery center.  FAILURE TO FOLLOW THESE INSTRUCTIONS MAY RESULT IN THE CANCELLATION OF YOUR SURGERY  PATIENT SIGNATURE_________________________________  NURSE SIGNATURE__________________________________  ________________________________________________________________________

## 2020-03-01 ENCOUNTER — Encounter (HOSPITAL_COMMUNITY)
Admission: RE | Admit: 2020-03-01 | Discharge: 2020-03-01 | Disposition: A | Discharge status: Home / Self Care | Payer: BC Managed Care – PPO | Source: Ambulatory Visit | Attending: Surgery | Admitting: Surgery

## 2020-03-01 ENCOUNTER — Encounter (HOSPITAL_COMMUNITY): Payer: Self-pay | Admitting: Surgery

## 2020-03-01 HISTORY — DX: Unspecified atrial fibrillation: I48.91

## 2020-03-01 NOTE — H&P (Signed)
General Surgery [facility]  Annamaria Boots DOB: [birth year] Married / Language: English / Race: White Male   History of Present Illness  The patient is a 79 year old male who presents with an inguinal hernia.  CHIEF COMPLAINT: left inguinal hernia  Patient returns to my practice for follow-up of left inguinal hernia. He originally presented in April 2021. Since this time the hernia has gradually increased in size and now prolapses more easily. It remains easily reducible. He has had no signs or symptoms of obstruction. He is eating prunes to keep his bowels regular. He has had no symptoms on the right side. He is interested in proceeding with repair in the near future.   Problem List/Past Medical  S/P RIGHT INGUINAL HERNIA REPAIR (Z98.890)  ABDOMINAL PAIN (R10.9)  INGUINAL HERNIA OF LEFT SIDE WITHOUT OBSTRUCTION OR GANGRENE (K40.90)   Past Surgical History Gallbladder Surgery - Open  Hip Surgery  Bilateral. Shoulder Surgery  Bilateral.  Diagnostic Studies History  Colonoscopy  5-10 years ago  Allergies  No Known Drug Allergies   Medication History  Vitamin D (Oral) Specific strength unknown - Active. Apple Cider Vinegar (Oral) Specific strength unknown - Active. Ambien (5MG  Tablet, Oral) Active. Crestor (5MG  Tablet, Oral) Active. CoQ10 (200MG  Capsule, Oral) Active. Aspirin (81MG  Tablet DR, Oral) Active. Centrum Silver (Oral) Active. Medications Reconciled  Social History  Alcohol use  Occasional alcohol use. No caffeine use  No drug use  Tobacco use  Never smoker.  Family History  Hypertension  Mother.  Other Problems  Cholelithiasis  Hypercholesterolemia  Inguinal Hernia   Vitals  Weight: 207 lb Height: 73in Body Surface Area: 2.18 m Body Mass Index: 27.31 kg/m  Temp.: 98.42F (Thermal Scan)  Pulse: 68 (Regular)  BP: 124/68(Sitting, Left Arm, Standard)  Physical Exam   GENERAL  APPEARANCE Development: normal Nutritional status: normal Gross deformities: none  SKIN Rash, lesions, ulcers: none Induration, erythema: none Nodules: none palpable  EYES Conjunctiva and lids: normal Pupils: equal and reactive Iris: normal bilaterally  EARS, NOSE, MOUTH, THROAT External ears: no lesion or deformity External nose: no lesion or deformity Hearing: grossly normal Due to Covid-19 pandemic, patient is wearing a mask.  NECK Symmetric: yes Trachea: midline Thyroid: no palpable nodules in the thyroid bed  CHEST Respiratory effort: normal Retraction or accessory muscle use: no Breath sounds: normal bilaterally Rales, rhonchi, wheeze: none  CARDIOVASCULAR Auscultation: regular rhythm, normal rate Murmurs: none Pulses: radial pulse 2+ palpable Lower extremity edema: none  GENITOURINARY Penis: no lesions Scrotum: no masses Palpation in the right inguinal canal with cough and Valsalva shows no sign of recurrence. There is no tenderness. Palpation in the left inguinal canal shows a small moderate sized left inguinal hernia. It is soft and nontender. It is reducible with manipulation but prolapses with Valsalva.  MUSCULOSKELETAL Station and gait: normal Digits and nails: no clubbing or cyanosis Muscle strength: grossly normal all extremities Range of motion: grossly normal all extremities Deformity: none  LYMPHATIC Cervical: none palpable Supraclavicular: none palpable  PSYCHIATRIC Oriented to person, place, and time: yes Mood and affect: normal for situation Judgment and insight: appropriate for situation    Assessment & Plan  INGUINAL HERNIA OF LEFT SIDE WITHOUT OBSTRUCTION OR GANGRENE (K40.90)  Patient returns to my practice today due to an enlarging left inguinal hernia. This had been diagnosed in April 2021. Over the past few months it is gradually increased in size and is now prolapsing on a more regular basis. It  remains easily reducible.  He has had no signs or symptoms of obstruction. There is no sign of recurrence on the right side.  We discussed open hernia repair with mesh just as we had done with his right inguinal hernia. We discussed restrictions on his activities after the procedure. We will make arrangements for surgery at a time convenient for the patient in the near future.  The risks and benefits of the procedure have been discussed at length with the patient. The patient understands the proposed procedure, potential alternative treatments, and the course of recovery to be expected. All of the patient's questions have been answered at this time. The patient wishes to proceed with surgery.  Armandina Gemma, MD [facility] Office: [phone]

## 2020-03-01 NOTE — Progress Notes (Signed)
COVID Vaccine Completed: Yes Date COVID Vaccine completed:07/31/2019 COVID vaccine manufacturer: [employer]       PCP - Geannie Risen, MD Cardiologist - Adrian Prows, MD.  Last [place] 12-29-19  Chest x-ray - greater than 1 year EKG - 12-29-19 in Epic Stress Test - 06-02-19 in Epic ECHO - 04-01-19 in Epic Cardiac Cath - N/A Event Monitor - 04-01-19 in Epic  Sleep Study - Yes CPAP - Yes  Fasting Blood Sugar - N/A Checks Blood Sugar __N/A___ times a day  Blood Thinner Instructions: N/A Aspirin Instructions:  ASA 81 mg Last Dose: will stop 5 days prior to surgery per patient  Anesthesia review: Mod aortic regurgitation, trace aortic stenosis.  Afib episode x1   Patient denies shortness of breath, fever, cough and chest pain at PAT appointment   Patient verbalized understanding of instructions that were given to them at the PAT appointment. Patient was also instructed that they will need to review over the PAT instructions again at home before surgery.

## 2020-03-01 NOTE — Patient Instructions (Addendum)
DUE TO COVID-19 ONLY ONE VISITOR IS ALLOWED TO COME WITH YOU AND STAY IN THE WAITING ROOM ONLY DURING        PRE OP AND PROCEDURE.                COVID SWAB TESTING MUST BE COMPLETED ON:  Tuesday, 03-09-20 @ 9:30 AM   [address] [place], [place] [address]  (Must self quarantine after testing. Follow instructions on handout.)               Your procedure is scheduled on: Friday, 03-12-20              Report to [facility] Main  Entrance              Report to Short Stay at 5:30 AM   [facility]                           Call this number if you have problems the morning of surgery [phone]                           Do not eat solid food :After Midnight.              May have liquids until 4:30 AM day of surgery   CLEAR LIQUID DIET  Foods Allowed                                                                     Foods Excluded  Water, Black Coffee and tea, regular and decaf               liquids that you cannot  Plain Jell-O in any flavor  (No red)                                     see through such as: Fruit ices (not with fruit pulp)                                             milk, soups, orange juice              Iced Popsicles (No red)                                                        All solid food                                   Apple juices Sports drinks like Gatorade (No red) Lightly seasoned clear broth or consume(fat free)             Sugar, honey syrup  Oral Hygieneis also important to reduce your risk of infection.  Remember - BRUSH YOUR TEETH THE MORNING OF SURGERY WITH YOUR REGULAR TOOTHPASTE              Do NOT smoke after Midnight              Take these medicines the morning of surgery with A SIP OF WATER:   None              You may not have any metal on your body including jewelry, and body piercings             Do not wear lotions, powders, perfumes/cologne, or deodorant              Men may shave face and neck.              Do not bring valuables to the hospital. [place].              Contacts, dentures or bridgework may not be worn into surgery.                           Patients discharged the day of surgery will not be allowed to drive home.                           Please read over the following fact sheets you were given: IF YOU HAVE QUESTIONS ABOUT YOUR PRE OP INSTRUCTIONS     PLEASE CALL [phone]   [facility] - Preparing for Surgery Before surgery, you can play an important role.  Because skin is not sterile, your skin needs to be as free of germs as possible.  You can reduce the number of germs on your skin by washing with CHG (chlorahexidine gluconate) soap before surgery.  CHG is an antiseptic cleaner which kills germs and bonds with the skin to continue killing germs even after washing. Please DO NOT use if you have an allergy to CHG or antibacterial soaps.  If your skin becomes reddened/irritated stop using the CHG and inform your nurse when you arrive at Short Stay. Do not shave (including legs and underarms) for at least 48 hours prior to the first CHG shower.  You may shave your face/neck.  Please follow these instructions carefully:             1.  Shower with CHG Soap the night before surgery and the  morning of surgery.             2.  If you choose to wash your hair, wash your hair first as usual with your normal  shampoo.             3.  After you shampoo, rinse your hair and body thoroughly to remove the shampoo.                                                 4.  Use CHG as you would any other liquid soap.  You can apply chg directly to the skin and wash.  Gently with a scrungie or clean washcloth.             5.  Apply the CHG Soap to your body ONLY FROM THE NECK DOWN.   Do  not use on face/ open                           Wound or open sores. Avoid contact with eyes, ears mouth  and                        genitals (private parts).                       Wash face,  Genitals (private parts) with your normal soap.             6.  Wash thoroughly, paying special attention to the area where your                                     surgery  will be performed.             7.  Thoroughly rinse your body with warm water from the neck down.             8.  DO NOT shower/wash with your normal soap after using and rinsing off the CHG Soap.                9.  Pat yourself dry with a clean towel.            10.  Wear clean pajamas.            11.  Place clean sheets on your bed the night of your first shower and do not  sleep with pets. Day of Surgery : Do not apply any lotions/deodorants the morning of surgery.  Please wear clean clothes to the hospital/surgery center.  FAILURE TO FOLLOW THESE INSTRUCTIONS MAY RESULT IN THE CANCELLATION OF YOUR SURGERY  PATIENT SIGNATURE_________________________________  NURSE SIGNATURE__________________________________  ________________________________________________________________________

## 2020-03-02 ENCOUNTER — Other Ambulatory Visit: Payer: Self-pay

## 2020-03-02 ENCOUNTER — Encounter (HOSPITAL_COMMUNITY)
Admission: RE | Admit: 2020-03-02 | Discharge: 2020-03-02 | Disposition: A | Discharge status: Home / Self Care | Payer: BC Managed Care – PPO | Source: Ambulatory Visit | Attending: Surgery | Admitting: Surgery

## 2020-03-02 ENCOUNTER — Encounter (HOSPITAL_COMMUNITY): Payer: Self-pay

## 2020-03-02 DIAGNOSIS — Z01812 Encounter for preprocedural laboratory examination: Secondary | ICD-10-CM | POA: Insufficient documentation

## 2020-03-02 HISTORY — DX: Malignant neoplasm of prostate: C61

## 2020-03-02 HISTORY — DX: Personal history of irradiation: Z92.3

## 2020-03-02 LAB — BASIC METABOLIC PANEL
Anion gap: 11 (ref 5–15)
BUN: 20 mg/dL (ref 8–23)
CO2: 27 mmol/L (ref 22–32)
Calcium: 9.5 mg/dL (ref 8.9–10.3)
Chloride: 103 mmol/L (ref 98–111)
Creatinine, Ser: 1.17 mg/dL (ref 0.61–1.24)
GFR calc Af Amer: >60 mL/min (ref >60)
GFR calc non Af Amer: 58 mL/min — ABNORMAL LOW (ref >60)
Glucose, Bld: 90 mg/dL (ref 70–99)
Potassium: 4.5 mmol/L (ref 3.5–5.1)
Sodium: 141 mmol/L (ref 135–145)

## 2020-03-02 LAB — CBC
HCT: 44.1 % (ref 39.0–52.0)
Hemoglobin: 14.8 g/dL (ref 13.0–17.0)
MCH: 32.7 pg (ref 26.0–34.0)
MCHC: 33.6 g/dL (ref 30.0–36.0)
MCV: 97.6 fL (ref 80.0–100.0)
Platelets: 225 10*3/uL (ref 150–400)
RBC: 4.52 MIL/uL (ref 4.22–5.81)
RDW: 12 % (ref 11.5–15.5)
WBC: 7.2 10*3/uL (ref 4.0–10.5)
nRBC: 0 % (ref 0.0–0.2)

## 2020-03-09 ENCOUNTER — Other Ambulatory Visit (HOSPITAL_COMMUNITY)
Admission: RE | Admit: 2020-03-09 | Discharge: 2020-03-09 | Disposition: A | Discharge status: Home / Self Care | Payer: BC Managed Care – PPO | Source: Ambulatory Visit | Attending: Surgery | Admitting: Surgery

## 2020-03-09 DIAGNOSIS — Z01812 Encounter for preprocedural laboratory examination: Secondary | ICD-10-CM | POA: Diagnosis not present

## 2020-03-09 DIAGNOSIS — Z20822 Contact with and (suspected) exposure to covid-19: Secondary | ICD-10-CM | POA: Insufficient documentation

## 2020-03-09 LAB — SARS CORONAVIRUS 2 (TAT 6-24 HRS): SARS Coronavirus 2: NEGATIVE

## 2020-03-12 ENCOUNTER — Ambulatory Visit (HOSPITAL_COMMUNITY)
Admission: RE | Admit: 2020-03-12 | Discharge: 2020-03-12 | Disposition: A | Discharge status: Home / Self Care | Payer: BC Managed Care – PPO | Attending: Surgery | Admitting: Surgery

## 2020-03-12 ENCOUNTER — Encounter (HOSPITAL_COMMUNITY)
Admission: RE | Disposition: A | Discharge status: Home / Self Care | Payer: Self-pay | Source: Home / Self Care | Attending: Surgery

## 2020-03-12 ENCOUNTER — Encounter (HOSPITAL_COMMUNITY): Payer: Self-pay | Admitting: Surgery

## 2020-03-12 ENCOUNTER — Ambulatory Visit (HOSPITAL_COMMUNITY): Payer: BC Managed Care – PPO | Admitting: Certified Registered Nurse Anesthetist

## 2020-03-12 ENCOUNTER — Ambulatory Visit (HOSPITAL_COMMUNITY): Payer: BC Managed Care – PPO | Admitting: Physician Assistant

## 2020-03-12 DIAGNOSIS — K4090 Unilateral inguinal hernia, without obstruction or gangrene, not specified as recurrent: Secondary | ICD-10-CM | POA: Diagnosis not present

## 2020-03-12 DIAGNOSIS — E785 Hyperlipidemia, unspecified: Secondary | ICD-10-CM | POA: Diagnosis not present

## 2020-03-12 DIAGNOSIS — E7800 Pure hypercholesterolemia, unspecified: Secondary | ICD-10-CM | POA: Diagnosis not present

## 2020-03-12 DIAGNOSIS — M1610 Unilateral primary osteoarthritis, unspecified hip: Secondary | ICD-10-CM | POA: Diagnosis not present

## 2020-03-12 DIAGNOSIS — C61 Malignant neoplasm of prostate: Secondary | ICD-10-CM | POA: Diagnosis not present

## 2020-03-12 HISTORY — PX: INGUINAL HERNIA REPAIR: SHX194

## 2020-03-12 SURGERY — REPAIR, HERNIA, INGUINAL, ADULT
Anesthesia: General | Laterality: Left

## 2020-03-12 MED ORDER — CHLORHEXIDINE GLUCONATE CLOTH 2 % EX PADS: 6.0000 | MEDICATED_PAD | Freq: Once | CUTANEOUS | Status: DC

## 2020-03-12 MED ORDER — FENTANYL CITRATE (PF) 250 MCG/5ML IJ SOLN
INTRAMUSCULAR | Status: AC
  Filled 2020-03-12: qty 5

## 2020-03-12 MED ORDER — FENTANYL CITRATE (PF) 100 MCG/2ML IJ SOLN: 25.0000 ug | INTRAMUSCULAR | Status: DC | PRN

## 2020-03-12 MED ORDER — BUPIVACAINE HCL (PF) 0.5 % IJ SOLN
INTRAMUSCULAR | Status: DC | PRN
  Administered 2020-03-12: 18.5 mL

## 2020-03-12 MED ORDER — 0.9 % SODIUM CHLORIDE (POUR BTL) OPTIME
TOPICAL | Status: DC | PRN
  Administered 2020-03-12: 1000 mL

## 2020-03-12 MED ORDER — DEXAMETHASONE SODIUM PHOSPHATE 10 MG/ML IJ SOLN
INTRAMUSCULAR | Status: AC
  Filled 2020-03-12: qty 1

## 2020-03-12 MED ORDER — ORAL CARE MOUTH RINSE: 15.0000 mL | Freq: Once | OROMUCOSAL | Status: AC

## 2020-03-12 MED ORDER — ROCURONIUM BROMIDE 10 MG/ML (PF) SYRINGE
PREFILLED_SYRINGE | INTRAVENOUS | Status: AC
  Filled 2020-03-12: qty 10

## 2020-03-12 MED ORDER — LIDOCAINE 2% (20 MG/ML) 5 ML SYRINGE
INTRAMUSCULAR | Status: AC
  Filled 2020-03-12: qty 5

## 2020-03-12 MED ORDER — DEXAMETHASONE SODIUM PHOSPHATE 10 MG/ML IJ SOLN
INTRAMUSCULAR | Status: DC | PRN
  Administered 2020-03-12: 8 mg via INTRAVENOUS

## 2020-03-12 MED ORDER — PHENYLEPHRINE HCL (PRESSORS) 10 MG/ML IV SOLN
INTRAVENOUS | Status: AC
  Filled 2020-03-12: qty 2

## 2020-03-12 MED ORDER — PROPOFOL 10 MG/ML IV BOLUS
INTRAVENOUS | Status: AC
  Filled 2020-03-12: qty 20

## 2020-03-12 MED ORDER — FENTANYL CITRATE (PF) 100 MCG/2ML IJ SOLN
INTRAMUSCULAR | Status: AC
  Filled 2020-03-12: qty 2

## 2020-03-12 MED ORDER — SUGAMMADEX SODIUM 500 MG/5ML IV SOLN
INTRAVENOUS | Status: DC | PRN
  Administered 2020-03-12: 270 mg via INTRAVENOUS

## 2020-03-12 MED ORDER — BUPIVACAINE LIPOSOME 1.3 % IJ SUSP
20.0000 mL | Freq: Once | INTRAMUSCULAR | Status: AC
  Administered 2020-03-12: 18.5 mL
  Filled 2020-03-12: qty 20

## 2020-03-12 MED ORDER — CHLORHEXIDINE GLUCONATE 0.12 % MT SOLN
15.0000 mL | Freq: Once | OROMUCOSAL | Status: AC
  Administered 2020-03-12: 15 mL via OROMUCOSAL

## 2020-03-12 MED ORDER — LIDOCAINE 2% (20 MG/ML) 5 ML SYRINGE
INTRAMUSCULAR | Status: DC | PRN
  Administered 2020-03-12: 100 mg via INTRAVENOUS

## 2020-03-12 MED ORDER — ONDANSETRON HCL 4 MG/2ML IJ SOLN
INTRAMUSCULAR | Status: DC | PRN
  Administered 2020-03-12: 4 mg via INTRAVENOUS

## 2020-03-12 MED ORDER — CEFAZOLIN SODIUM-DEXTROSE 2-4 GM/100ML-% IV SOLN
2.0000 g | INTRAVENOUS | Status: AC
  Administered 2020-03-12: 2 g via INTRAVENOUS
  Filled 2020-03-12: qty 100

## 2020-03-12 MED ORDER — OXYCODONE HCL 5 MG/5ML PO SOLN: 5.0000 mg | Freq: Once | ORAL | Status: DC | PRN

## 2020-03-12 MED ORDER — OXYCODONE HCL 5 MG PO TABS: 5.0000 mg | ORAL_TABLET | Freq: Once | ORAL | Status: DC | PRN

## 2020-03-12 MED ORDER — ONDANSETRON HCL 4 MG/2ML IJ SOLN
INTRAMUSCULAR | Status: AC
  Filled 2020-03-12: qty 2

## 2020-03-12 MED ORDER — EPHEDRINE SULFATE-NACL 50-0.9 MG/10ML-% IV SOSY
PREFILLED_SYRINGE | INTRAVENOUS | Status: DC | PRN
  Administered 2020-03-12 (×5): 10 mg via INTRAVENOUS

## 2020-03-12 MED ORDER — TRAMADOL HCL 50 MG PO TABS: 50.0000 mg | ORAL_TABLET | Freq: Four times a day (QID) | ORAL | 0 refills | Status: DC | PRN

## 2020-03-12 MED ORDER — BUPIVACAINE HCL (PF) 0.5 % IJ SOLN
INTRAMUSCULAR | Status: AC
  Filled 2020-03-12: qty 30

## 2020-03-12 MED ORDER — PROPOFOL 10 MG/ML IV BOLUS
INTRAVENOUS | Status: DC | PRN
  Administered 2020-03-12: 120 mg via INTRAVENOUS

## 2020-03-12 MED ORDER — FENTANYL CITRATE (PF) 100 MCG/2ML IJ SOLN
INTRAMUSCULAR | Status: DC | PRN
Start: 2020-03-12 — End: 2020-03-12
  Administered 2020-03-12: 25 ug via INTRAVENOUS
  Administered 2020-03-12: 50 ug via INTRAVENOUS
  Administered 2020-03-12 (×3): 25 ug via INTRAVENOUS
  Administered 2020-03-12: 50 ug via INTRAVENOUS

## 2020-03-12 MED ORDER — LACTATED RINGERS IV SOLN: INTRAVENOUS | Status: DC

## 2020-03-12 MED ORDER — ROCURONIUM BROMIDE 10 MG/ML (PF) SYRINGE
PREFILLED_SYRINGE | INTRAVENOUS | Status: DC | PRN
  Administered 2020-03-12: 70 mg via INTRAVENOUS

## 2020-03-12 SURGICAL SUPPLY — 37 items
ADH SKN CLS APL DERMABOND .7 (GAUZE/BANDAGES/DRESSINGS) ×1
APL PRP STRL LF DISP 70% ISPRP (MISCELLANEOUS) ×2
BLADE SURG 15 STRL LF DISP TIS (BLADE) ×1 IMPLANT
BLADE SURG 15 STRL SS (BLADE) ×3
CHLORAPREP W/TINT 26 (MISCELLANEOUS) ×6 IMPLANT
CLOSURE WOUND 1/2 X4 (GAUZE/BANDAGES/DRESSINGS) ×1
COVER SURGICAL LIGHT HANDLE (MISCELLANEOUS) ×3 IMPLANT
COVER WAND RF STERILE (DRAPES) ×3 IMPLANT
DECANTER SPIKE VIAL GLASS SM (MISCELLANEOUS) ×3 IMPLANT
DERMABOND ADVANCED (GAUZE/BANDAGES/DRESSINGS) ×2
DERMABOND ADVANCED .7 DNX12 (GAUZE/BANDAGES/DRESSINGS) IMPLANT
DRAIN PENROSE 0.5X18 (DRAIN) ×3 IMPLANT
DRAPE LAPAROTOMY TRNSV 102X78 (DRAPES) ×3 IMPLANT
ELECT REM PT RETURN 15FT ADLT (MISCELLANEOUS) ×3 IMPLANT
GLOVE BIOGEL PI IND STRL 7.0 (GLOVE) ×1 IMPLANT
GLOVE BIOGEL PI INDICATOR 7.0 (GLOVE) ×2
GLOVE SURG ORTHO 8.0 STRL STRW (GLOVE) ×3 IMPLANT
GOWN STRL REUS W/TWL XL LVL3 (GOWN DISPOSABLE) ×6 IMPLANT
KIT BASIN OR (CUSTOM PROCEDURE TRAY) ×3 IMPLANT
KIT TURNOVER KIT A (KITS) IMPLANT
MESH ULTRAPRO 3X6 7.6X15CM (Mesh General) ×2 IMPLANT
NDL HYPO 25X1 1.5 SAFETY (NEEDLE) ×1 IMPLANT
NEEDLE HYPO 25X1 1.5 SAFETY (NEEDLE) ×3 IMPLANT
NS IRRIG 1000ML POUR BTL (IV SOLUTION) ×3 IMPLANT
PACK BASIC VI WITH GOWN DISP (CUSTOM PROCEDURE TRAY) ×3 IMPLANT
PENCIL SMOKE EVACUATOR (MISCELLANEOUS) IMPLANT
SPONGE LAP 4X18 RFD (DISPOSABLE) ×9 IMPLANT
STRIP CLOSURE SKIN 1/2X4 (GAUZE/BANDAGES/DRESSINGS) ×2 IMPLANT
SUT MNCRL AB 4-0 PS2 18 (SUTURE) ×3 IMPLANT
SUT NOVA NAB GS-21 0 18 T12 DT (SUTURE) IMPLANT
SUT NOVA NAB GS-22 2 0 T19 (SUTURE) ×6 IMPLANT
SUT SILK 2 0 SH (SUTURE) ×3 IMPLANT
SUT VIC AB 3-0 SH 18 (SUTURE) ×3 IMPLANT
SYR BULB IRRIG 60ML STRL (SYRINGE) ×3 IMPLANT
SYR CONTROL 10ML LL (SYRINGE) ×3 IMPLANT
TOWEL OR 17X26 10 PK STRL BLUE (TOWEL DISPOSABLE) ×3 IMPLANT
YANKAUER SUCT BULB TIP 10FT TU (MISCELLANEOUS) ×3 IMPLANT

## 2020-03-12 NOTE — Transfer of Care (Signed)
Immediate Anesthesia Transfer of Care Note  Patient: Shawn Bell  Procedure(s) Performed: OPEN LEFT INGUINAL HERNIA REPAIR WITH MESH (Left )  Patient Location: PACU  Anesthesia Type:General  Level of Consciousness: awake, alert , oriented and patient cooperative  Airway & Oxygen Therapy: Patient Spontanous Breathing and Patient connected to face mask oxygen  Post-op Assessment: Report given to RN and Post -op Vital signs reviewed and stable  Post vital signs: Reviewed and stable  Last Vitals:  Vitals Value Taken Time  BP 131/77 03/12/20 0845  Temp 36.6 C 03/12/20 0842  Pulse 88 03/12/20 0846  Resp 16 03/12/20 0846  SpO2 98 % 03/12/20 0846  Vitals shown include unvalidated device data.  Last Pain:  Vitals:   03/12/20 0845  TempSrc:   PainSc: 0-No pain         Complications: No complications documented.

## 2020-03-12 NOTE — Anesthesia Preprocedure Evaluation (Signed)
Anesthesia Evaluation  Patient identified by MRN, date of birth, ID band Patient awake    Reviewed: Allergy & Precautions, NPO status , Patient's Chart, lab work & pertinent test results  Airway Mallampati: III  TM Distance: <3 FB Neck ROM: Full    Dental no notable dental hx.    Pulmonary neg pulmonary ROS,    Pulmonary exam normal breath sounds clear to auscultation       Cardiovascular negative cardio ROS Normal cardiovascular exam Rhythm:Regular Rate:Normal     Neuro/Psych negative neurological ROS  negative psych ROS   GI/Hepatic negative GI ROS, Neg liver ROS,   Endo/Other  negative endocrine ROS  Renal/GU negative Renal ROS  negative genitourinary   Musculoskeletal  (+) Arthritis ,   Abdominal   Peds negative pediatric ROS (+)  Hematology negative hematology ROS (+)   Anesthesia Other Findings   Reproductive/Obstetrics negative OB ROS                             Anesthesia Physical Anesthesia Plan  ASA: II  Anesthesia Plan: General   Post-op Pain Management:    Induction: Intravenous  PONV Risk Score and Plan: 2 and Ondansetron and Dexamethasone  Airway Management Planned: Oral ETT  Additional Equipment:   Intra-op Plan:   Post-operative Plan: Extubation in OR  Informed Consent: I have reviewed the patients History and Physical, chart, labs and discussed the procedure including the risks, benefits and alternatives for the proposed anesthesia with the patient or authorized representative who has indicated his/her understanding and acceptance.     Dental advisory given  Plan Discussed with: CRNA and Surgeon  Anesthesia Plan Comments:         Anesthesia Quick Evaluation

## 2020-03-12 NOTE — Op Note (Signed)
Procedure Note  Pre-operative Diagnosis:  Left inguinal hernia, reducible  Post-operative Diagnosis: same  Procedure:  Open left inguinal hernia repair with mesh  Surgeon:  Armandina Gemma, MD  Assistant:  Michaelle Birks, MD  Anesthesia:  General  Preparation:  Chlora-prep  Estimated Blood Loss: minimal  Complications:  none  Indications: Patient returns to my practice for follow-up of left inguinal hernia. He originally presented in April 2021. Since this time the hernia has gradually increased in size and now prolapses more easily. It remains easily reducible. He has had no signs or symptoms of obstruction. He is eating prunes to keep his bowels regular. He has had no symptoms on the right side. He is interested in proceeding with repair.  Procedure Details  The patient was evaluated in the holding area. All of the patient's questions were answered and the proposed procedure was confirmed. The site of the procedure was properly marked. The patient was taken to the Operating Room, identified by name, and the procedure verified as inguinal hernia repair.  The patient was placed in the supine position and underwent induction of anesthesia. A "Time Out" was performed per routine. The lower abdomen and groin were prepped and draped in the usual aseptic fashion.  After ascertaining that an adequate level of anesthesia had been obtained, an incision was made in the groin with a #10 blade.  Dissection was carried through the subcutaneous tissues and hemostasis obtained with the electrocautery.  A Gelpi retractor was placed for exposure.  The external oblique fascia was incised in line with it's fibers and extended through the external inguinal ring.  The cord structures were dissected out of the inguinal canal and encircled with a Penrose drain.  The floor of the inguinal canal was dissected out.  There was moderate laxity of the floor without a fascial defect.  The cord was explored and a  moderate sized sac was identified and dissected out to the level of the internal ring.  A high ligation was performed with a 2-0 silk suture ligature.  The sac was excised and discarded.  The floor of the inguinal canal was reconstructed with Ethicon Ultrapro mesh cut to the appropriate dimensions.  It was secured to the pubic tubercle with a 2-0 Novafil suture and along the inguinal ligament with a running 2-0 Novafil suture.  Mesh was split to accommodate the cord structures.  The superior margin of the mesh was secured to the transversalis and internal oblique musculature with interrupted 2-0 Novafil sutures.  The tails of the mesh were overlapped lateral to the cord structures and secured to the inguinal ligament with interrupted 2-0 Novafil sutures to recreate the internal inguinal ring.  Cord structures were returned to the inguinal canal.  Local anesthetic was infiltrated throughout the field.  External oblique fascia was closed with interrupted 3-0 Vicryl sutures.  Subcutaneous tissues were closed with interrupted 3-0 Vicryl sutures.  Skin was anesthetized with local anesthetic, and the skin edges were re-approximated with a running 4-0 Monocryl suture.  Wound was washed and dried and Dermabond was applied.  Instrument, sponge, and needle counts were correct prior to closure and at the conclusion of the case.  The patient tolerated the procedure well.  The patient was awakened from anesthesia and brought to the recovery room in stable condition.  Armandina Gemma, MD [facility] Office: [phone]

## 2020-03-12 NOTE — Interval H&P Note (Signed)
History and Physical Interval Note:  03/12/2020 6:59 AM  Shawn Bell  has presented today for surgery, with the diagnosis of LEFT INGUINAL HERNIA, REDUCIBLE.  The various methods of treatment have been discussed with the patient and family. After consideration of risks, benefits and other options for treatment, the patient has consented to    Procedure(s): OPEN LEFT INGUINAL HERNIA REPAIR WITH MESH (Left) as a surgical intervention.    The patient's history has been reviewed, patient examined, no change in status, stable for surgery.  I have reviewed the patient's chart and labs.  Questions were answered to the patient's satisfaction.    Armandina Gemma, MD [facility] Office: [place]

## 2020-03-12 NOTE — Anesthesia Procedure Notes (Signed)
Procedure Name: Intubation Date/Time: 03/12/2020 7:24 AM Performed by: West Pugh, CRNA Pre-anesthesia Checklist: Patient identified, Emergency Drugs available, Suction available, Patient being monitored and Timeout performed Patient Re-evaluated:Patient Re-evaluated prior to induction Oxygen Delivery Method: Circle system utilized Preoxygenation: Pre-oxygenation with 100% oxygen Induction Type: IV induction Ventilation: Mask ventilation without difficulty and Oral airway inserted - appropriate to patient size Laryngoscope Size: Mac and 4 Grade View: Grade I Tube type: Oral Tube size: 7.5 mm Number of attempts: 1 Airway Equipment and Method: Stylet Placement Confirmation: ETT inserted through vocal cords under direct vision,  positive ETCO2,  CO2 detector and breath sounds checked- equal and bilateral Secured at: 22 cm Tube secured with: Tape Dental Injury: Teeth and Oropharynx as per pre-operative assessment

## 2020-03-12 NOTE — Anesthesia Postprocedure Evaluation (Signed)
Anesthesia Post Note  Patient: Shawn Bell  Procedure(s) Performed: OPEN LEFT INGUINAL HERNIA REPAIR WITH MESH (Left )     Patient location during evaluation: PACU Anesthesia Type: General Level of consciousness: awake and alert Pain management: pain level controlled Vital Signs Assessment: post-procedure vital signs reviewed and stable Respiratory status: spontaneous breathing, nonlabored ventilation, respiratory function stable and patient connected to nasal cannula oxygen Cardiovascular status: blood pressure returned to baseline and stable Postop Assessment: no apparent nausea or vomiting Anesthetic complications: no   No complications documented.  Last Vitals:  Vitals:   03/12/20 0900 03/12/20 0940  BP: (!) 143/79 137/75  Pulse: 85 85  Resp: 16 16  Temp: 36.6 C   SpO2: 92% 100%    Last Pain:  Vitals:   03/12/20 0940  TempSrc:   PainSc: 0-No pain                 Alixandra Alfieri S

## 2020-03-15 ENCOUNTER — Encounter (HOSPITAL_COMMUNITY): Payer: Self-pay | Admitting: Surgery

## 2020-07-27 DIAGNOSIS — M47812 Spondylosis without myelopathy or radiculopathy, cervical region: Secondary | ICD-10-CM | POA: Diagnosis not present

## 2020-07-27 DIAGNOSIS — M542 Cervicalgia: Secondary | ICD-10-CM | POA: Diagnosis not present

## 2021-03-18 DIAGNOSIS — M170 Bilateral primary osteoarthritis of knee: Secondary | ICD-10-CM | POA: Diagnosis not present

## 2021-03-29 DIAGNOSIS — M2669 Other specified disorders of temporomandibular joint: Secondary | ICD-10-CM | POA: Diagnosis not present

## 2021-03-29 DIAGNOSIS — H9201 Otalgia, right ear: Secondary | ICD-10-CM | POA: Diagnosis not present

## 2021-03-29 DIAGNOSIS — J3489 Other specified disorders of nose and nasal sinuses: Secondary | ICD-10-CM | POA: Diagnosis not present

## 2021-03-29 DIAGNOSIS — J341 Cyst and mucocele of nose and nasal sinus: Secondary | ICD-10-CM | POA: Diagnosis not present

## 2021-03-29 DIAGNOSIS — J329 Chronic sinusitis, unspecified: Secondary | ICD-10-CM | POA: Diagnosis not present

## 2021-03-29 DIAGNOSIS — J342 Deviated nasal septum: Secondary | ICD-10-CM | POA: Diagnosis not present

## 2021-08-12 ENCOUNTER — Other Ambulatory Visit: Payer: Self-pay

## 2021-08-12 ENCOUNTER — Ambulatory Visit: Payer: BC Managed Care – PPO | Admitting: Student

## 2021-08-12 ENCOUNTER — Encounter: Payer: Self-pay | Admitting: Student

## 2021-08-12 VITALS — BP 127/68 | HR 86 | Temp 98.3°F | Resp 17 | Ht 73.0 in | Wt 213.0 lb

## 2021-08-12 DIAGNOSIS — I480 Paroxysmal atrial fibrillation: Secondary | ICD-10-CM | POA: Diagnosis not present

## 2021-08-12 MED ORDER — APIXABAN 5 MG PO TABS: 5.0000 mg | ORAL_TABLET | Freq: Two times a day (BID) | ORAL | 1 refills | Status: DC

## 2021-08-12 NOTE — Progress Notes (Signed)
Primary Physician:  Debbora Lacrosse, FNP   Patient ID: Shawn Bell, male    DOB: 1941/05/05, 81 y.o.   MRN: [id number]  Subjective:    Chief Complaint  Patient presents with   Follow-up   Atrial Fibrillation    HPI: Shawn Bell  is a 81 y.o. male  with hyperlipidemia, history of prostate cancer status post radiation 20 years ago, paroxysmal atrial fibrillation (diagnosed 02/2019).    Patient has previously been recommended anticoagulation given paroxysmal atrial fibrillation and CHA2DS2-VASc score of 2.  However despite recognition of the benefits patient has opted to hold off on anticoagulation and prefers to only be on low-dose aspirin.  Patient presents today for urgent visit with concerns of recurrence of atrial fibrillation.  Patient was diagnosed with a sinus infection approximately 2 weeks ago and started on supportive treatment as well as antibiotics.  Patient states he has noticed over the same period of time his resting heart rate was increased from 60s to 80s beats per minute.  Patient states yesterday shortly after drinking an alcoholic beverage he received a notification from his Apple Watch that he was in atrial fibrillation with maximum heart rate at 130 bpm, but only briefly.  Patient states this episode of atrial fibrillation lasted approximately 2 hours last night, patient was asymptomatic throughout this episode.  Notably patient typically exercises regularly, however over the last few weeks due to feeling poor from his sinus infection he has not been exercising regularly.  Past Medical History:  Diagnosis Date   A-fib [facility])    Episode x1   Arthritis    shoulders   Diverticulosis of colon (without mention of hemorrhage)    Hemorrhoids    History of prostate cancer    S/P RADIATION [place]   History of radiation therapy    Hyperlipemia    Incomplete RBBB 12/2019   Intermittent self-catheterization of bladder    ongoing; has been self  catherizing for 5 years without any issues    Personal history of colonic polyps 01/23/2003   pt denies   Prostate cancer [facility])    Family History  Problem Relation Age of Onset   Heart failure Mother    Rheumatic fever Father    Colon cancer Neg Hx    Colon polyps Neg Hx    Rectal cancer Neg Hx    Stomach cancer Neg Hx    Social History   Tobacco Use   Smoking status: Never   Smokeless tobacco: Never  Substance Use Topics   Alcohol use: Yes    Comment: rarely   Marital Status: Married   ROS   Review of Systems  Constitutional: Negative for malaise/fatigue and weight gain.  Cardiovascular:  Negative for chest pain, claudication, dyspnea on exertion, leg swelling, near-syncope, orthopnea, palpitations, paroxysmal nocturnal dyspnea and syncope.  Respiratory:  Negative for shortness of breath.   Gastrointestinal:  Negative for melena.  Neurological:  Negative for dizziness.   Objective:  Blood pressure 127/68, pulse 86, temperature 98.3 F (36.8 C), temperature source Temporal, resp. rate 17, height $RemoveBe'6\' 1"'LkJRHJvZy$  (1.854 m), weight 213 lb (96.6 kg), SpO2 96 %. Body mass index is 28.1 kg/m.    Physical Exam Vitals reviewed.  Constitutional:      Appearance: He is well-developed.  Cardiovascular:     Rate and Rhythm: Normal rate and regular rhythm.     Pulses: Intact distal pulses.          Femoral pulses are 2+ on  the right side and 2+ on the left side.      Popliteal pulses are 2+ on the right side and 2+ on the left side.       Dorsalis pedis pulses are 1+ on the right side and 1+ on the left side.       Posterior tibial pulses are 2+ on the right side and 2+ on the left side.     Heart sounds: Normal heart sounds, S1 normal and S2 normal. No murmur heard.   No gallop.  Pulmonary:     Effort: Pulmonary effort is normal. No accessory muscle usage or respiratory distress.     Breath sounds: Normal breath sounds. No wheezing, rhonchi or rales.  Musculoskeletal:      Right lower leg: No edema.     Left lower leg: No edema.  Neurological:     Mental Status: He is alert.   Laboratory examination:   CMP Latest Ref Rng & Units 03/02/2020 03/17/2019 05/01/2017  Glucose 70 - 99 mg/dL 90 102(H) 86  BUN 8 - 23 mg/dL $Remove'20 22 12  'owdaVtX$ Creatinine 0.61 - 1.24 mg/dL 1.17 1.41(H) 1.06  Sodium 135 - 145 mmol/L 141 141 137  Potassium 3.5 - 5.1 mmol/L 4.5 4.3 4.6  Chloride 98 - 111 mmol/L 103 108 104  CO2 22 - 32 mmol/L $RemoveB'27 22 24  'EWxYvrhc$ Calcium 8.9 - 10.3 mg/dL 9.5 9.4 9.6  Total Protein 6.0 - 8.3 g/dL - - -  Total Bilirubin 0.3 - 1.2 mg/dL - - -  Alkaline Phos 39 - 117 U/L - - -  AST 0 - 37 U/L - - -  ALT 0 - 53 U/L - - -   CBC Latest Ref Rng & Units 03/02/2020 03/17/2019 05/02/2018  WBC 4.0 - 10.5 K/uL 7.2 7.1 6.4  Hemoglobin 13.0 - 17.0 g/dL 14.8 15.1 15.1  Hematocrit 39.0 - 52.0 % 44.1 45.7 45.4  Platelets 150 - 400 K/uL 225 224 209   Lipid Panel     Component Value Date/Time   CHOL 160 07/04/2015 0618   TRIG 168 (H) 07/04/2015 0618   HDL 35 (L) 07/04/2015 0618   CHOLHDL 4.6 07/04/2015 0618   VLDL 34 07/04/2015 0618   LDLCALC 91 07/04/2015 0618   HEMOGLOBIN A1C Lab Results  Component Value Date   HGBA1C 5.3 07/04/2015   MPG 105 07/04/2015   TSH No results for input(s): TSH in the last 8760 hours.  External Labs:  03/24/2019:  Glucose 127, Creatinine 1.37, eGFR 48, Potassium 4.4, CMP otherwise normal.   Allergies  No Known Allergies   Medications Prior to Visit:   Outpatient Medications Prior to Visit  Medication Sig Dispense Refill   Apoaequorin (PREVAGEN PO) Take 1 tablet by mouth daily.     APPLE CIDER VINEGAR PO Take 2 tablets by mouth daily. Gummy     Bacillus Coagulans-Inulin (PROBIOTIC-PREBIOTIC PO) Take 2 capsules by mouth daily.     Cholecalciferol (VITAMIN D3 GUMMIES PO) Take 1 tablet by mouth daily.     Coenzyme Q10 (COQ10) 200 MG CAPS Take 200 mg by mouth every evening.      ELDERBERRY PO Take 2 tablets by mouth daily. Gummy      Multiple Vitamin (MULTIVITAMIN WITH MINERALS) TABS tablet Take 1 tablet by mouth daily. Centrum Silver     rosuvastatin (CRESTOR) 5 MG tablet Take 1 tablet (5 mg total) by mouth daily. Needs office visit and labs-2nd notice (Patient taking differently: Take 5 mg by mouth every  evening.) 30 tablet 0   Zinc 50 MG TABS Take 50 mg by mouth daily.     zolpidem (AMBIEN) 5 MG tablet Take 5 mg by mouth at bedtime as needed for sleep. 1 and 0.5 mg tablet as needed at bed time     aspirin EC 81 MG tablet Take 81 mg by mouth every evening. Swallow whole.     traMADol (ULTRAM) 50 MG tablet Take 1-2 tablets (50-100 mg total) by mouth every 6 (six) hours as needed. 20 tablet 0   zolpidem (AMBIEN) 10 MG tablet Take 5 mg at bedtime as needed by mouth for sleep.     No facility-administered medications prior to visit.   Final Medications at End of Visit    Current Meds  Medication Sig   apixaban (ELIQUIS) 5 MG TABS tablet Take 1 tablet (5 mg total) by mouth 2 (two) times daily.   Apoaequorin (PREVAGEN PO) Take 1 tablet by mouth daily.   APPLE CIDER VINEGAR PO Take 2 tablets by mouth daily. Gummy   Bacillus Coagulans-Inulin (PROBIOTIC-PREBIOTIC PO) Take 2 capsules by mouth daily.   Cholecalciferol (VITAMIN D3 GUMMIES PO) Take 1 tablet by mouth daily.   Coenzyme Q10 (COQ10) 200 MG CAPS Take 200 mg by mouth every evening.    ELDERBERRY PO Take 2 tablets by mouth daily. Gummy   Multiple Vitamin (MULTIVITAMIN WITH MINERALS) TABS tablet Take 1 tablet by mouth daily. Centrum Silver   rosuvastatin (CRESTOR) 5 MG tablet Take 1 tablet (5 mg total) by mouth daily. Needs office visit and labs-2nd notice (Patient taking differently: Take 5 mg by mouth every evening.)   Zinc 50 MG TABS Take 50 mg by mouth daily.   zolpidem (AMBIEN) 5 MG tablet Take 5 mg by mouth at bedtime as needed for sleep. 1 and 0.5 mg tablet as needed at bed time   [DISCONTINUED] aspirin EC 81 MG tablet Take 81 mg by mouth every  evening. Swallow whole.   Radiology:   No results found.  Cardiac Studies:   Echocardiogram 04/01/2019:  Left ventricle cavity is normal in size. Mild concentric hypertrophy of  the left ventricle. Normal LV systolic function with EF 60%. Normal global  wall motion. Doppler evidence of grade I (impaired) diastolic dysfunction,  normal LAP.  Left atrial cavity is normal in size.  Trileaflet aortic valve.  Trace aortic valve stenosis. Moderate (Grade II)  aortic regurgitation.  Mild tricuspid regurgitation.  Trace pulmonic regurgitation. No evidence of pulmonary hypertension.   30-day event monitor 10/13-11/04/2019:  Normal sinus rhythm. 1 patient activated event without reported symptoms correlated with sinus rhythm with PVCs. 2 auto detected events occurred. One episode on 10/26 at 1221 AM for normal sinus rhythm with 4 beats of NSVT. No symptoms. 1 episode of sinus bradycardia with 4 beats of atrial tachycardia at 165 bpm on day 12 at 11:18 PM. No atrial fibrillation was noted.  Lexiscan Tetrofosmin Stress Test  06/02/2019: Nondiagnostic ECG stress due to [facility] pharmacologic stress.  Normal myocardial perfusion. All segments of left ventricle demonstrated normal wall motion and thickening. No stress lung uptake. Calculated Stress LV EF is mildly dysfunctional 48%. however appears normal visually.  No previous exam available for comparison. Low risk study.   EKG   08/12/2021: Sinus rhythm at a rate of 87 bpm.  Normal axis.  Incomplete right bundle branch block.  Compared EKG 12/29/2019, no significant change.  Assessment:     ICD-10-CM   1. Paroxysmal atrial fibrillation (HCC)  I48.0  EKG 12-Lead      CHA2DS2-VASCScore: Risk Score  2,  Yearly risk of stroke  2.2%. Recommendation: Anticoagulation   Recommendations:   Shawn Bell  is a 81 y.o. with hyperlipidemia, history of prostate cancer status post radiation 20 years ago, paroxysmal atrial fibrillation (diagnosed  02/2019).    Patient has previously been recommended anticoagulation given paroxysmal atrial fibrillation and CHA2DS2-VASc score of 2.  However despite recognition of the benefits patient has opted to hold off on anticoagulation and prefers to only be on low-dose aspirin.  Patient presents today for urgent visit with concerns of recurrence of atrial fibrillation.  Given recurrence of atrial fibrillation revisited the discussion of anticoagulation with patient, he is now open to at least short-term anticoagulation.  Discussed indication, risk, benefits, and alternatives, patient is willing to start Eliquis and take it for 1 month, then reevaluate need for long-term anticoagulation.  Advised patient to discontinue low-dose aspirin at this time.  Counseled patient regarding signs symptoms that would warrant urgent or emergent evaluation given use of anticoagulation.  In regard to patient's concern that his resting heart rate has increased, this is likely due to recent infectious process as well as deconditioning since he has not been exercising as previously.  Advised him to increase physical activity.  No further evaluation indicated at this time, however we will reevaluate at next office visit.  Follow-up in 4 weeks, sooner if needed.  Patient was seen in collaboration with Dr. Einar Gip and he is in agreement with the plan.    Alethia Berthold, PA-C 08/12/2021, 12:26 PM Office: [phone]

## 2021-09-08 DIAGNOSIS — M170 Bilateral primary osteoarthritis of knee: Secondary | ICD-10-CM | POA: Diagnosis not present

## 2021-09-09 ENCOUNTER — Encounter: Payer: Self-pay | Admitting: Student

## 2021-09-09 ENCOUNTER — Other Ambulatory Visit: Payer: Self-pay

## 2021-09-09 ENCOUNTER — Ambulatory Visit: Payer: BC Managed Care – PPO | Admitting: Student

## 2021-09-09 VITALS — BP 143/88 | HR 66 | Temp 97.6°F | Resp 16 | Ht 73.0 in | Wt 212.0 lb

## 2021-09-09 DIAGNOSIS — I480 Paroxysmal atrial fibrillation: Secondary | ICD-10-CM | POA: Diagnosis not present

## 2021-09-09 NOTE — Progress Notes (Signed)
? ?Primary Physician:  Debbora Lacrosse, [place] ? ? ?Patient ID: Shawn Bell, male    DOB: [birth year], 81 y.o.   MRN: [id number] ? ?Subjective:  ? ? ?Chief Complaint  ?Patient presents with  ? PAF  ? Follow-up  ?  4 weeks  ? ? ?HPI: Shawn Bell  is a 81 y.o. male  with hyperlipidemia, history of prostate cancer status post radiation 20 years ago, paroxysmal atrial fibrillation (diagnosed 02/2019).   ? ?Patient has previously been recommended anticoagulation given paroxysmal atrial fibrillation and CHA2DS2-VASc score of 2.  However despite recognition of the benefits patient has opted to hold off on anticoagulation and preferred to only be on low-dose aspirin. ? ?Patient's Apple Watch alerted him to recurrence of atrial fibrillation 08/11/2021 which lasted for approximately 2 hours with a maximum heart rate of 130 bpm.  Patient notes this appeared to have been triggered by dehydration and consuming alcoholic beverage, he was also being treated for sinus infection at that time.  At last office visit shared decision was to have him take Eliquis for at least 4 weeks given episode of atrial fibrillation, patient was agreeable.  He now presents for follow-up. ? ?Patient has had no recurrence of atrial fibrillation since last office visit.  Home blood pressure is well controlled with average readings 131/78 mmHg.  He continues to exercise daily without issue and is feeling better overall compared to last office visit.  He is tolerating anticoagulation without bleeding diathesis. ? ?Notably patient is planning to have both knees replaced at some point in the upcoming summer. ? ?Past Medical History:  ?Diagnosis Date  ? A-fib ([place])   ? Episode x1  ? Arthritis   ? shoulders  ? Diverticulosis of colon (without mention of hemorrhage)   ? Hemorrhoids   ? History of prostate cancer   ? S/P RADIATION [place]  ? History of radiation therapy   ? Hyperlipemia   ? Incomplete RBBB 12/2019  ? Intermittent self-catheterization of  bladder   ? ongoing; has been self catherizing for 5 years without any issues   ? Personal history of colonic polyps 01/23/2003  ? pt denies  ? Prostate cancer ([place])   ? ?Family History  ?Problem Relation Age of Onset  ? Heart failure Mother   ? Rheumatic fever Father   ? Colon cancer Neg Hx   ? Colon polyps Neg Hx   ? Rectal cancer Neg Hx   ? Stomach cancer Neg Hx   ? ?Social History  ? ?Tobacco Use  ? Smoking status: Never  ? Smokeless tobacco: Never  ?Substance Use Topics  ? Alcohol use: Yes  ?  Comment: rarely  ? Marital Status: Married  ? ?ROS  ? ?Review of Systems  ?Constitutional: Negative for malaise/fatigue and weight gain.  ?Cardiovascular:  Negative for chest pain, claudication, dyspnea on exertion, leg swelling, near-syncope, orthopnea, palpitations, paroxysmal nocturnal dyspnea and syncope.  ?Respiratory:  Negative for shortness of breath.   ?Gastrointestinal:  Negative for melena.  ?Neurological:  Negative for dizziness.  ? ?Objective:  ?Blood pressure (!) 143/88, pulse 66, temperature 97.6 ?F (36.4 ?C), temperature source Temporal, resp. rate 16, height _0  (1.854 m), weight 212 lb (96.2 kg), SpO2 98 %. Body mass index is 27.97 kg/m?. ?   ?Physical Exam ?Vitals reviewed.  ?Constitutional:   ?   Appearance: He is well-developed.  ?Cardiovascular:  ?   Rate and Rhythm: Normal rate and regular rhythm.  ?   Pulses:  Intact distal pulses.     ?     Femoral pulses are 2+ on the right side and 2+ on the left side. ?     Popliteal pulses are 2+ on the right side and 2+ on the left side.  ?     Dorsalis pedis pulses are 1+ on the right side and 1+ on the left side.  ?     Posterior tibial pulses are 2+ on the right side and 2+ on the left side.  ?   Heart sounds: Normal heart sounds, S1 normal and S2 normal. No murmur heard. ?  No gallop.  ?Pulmonary:  ?   Effort: Pulmonary effort is normal. No accessory muscle usage or respiratory distress.  ?   Breath sounds: Normal breath sounds. No wheezing, rhonchi or  rales.  ?Musculoskeletal:  ?   Right lower leg: No edema.  ?   Left lower leg: No edema.  ?Neurological:  ?   Mental Status: He is alert.  ?Physical exam remains unchanged.  Previous office visit. ? ?Laboratory examination:  ? ? ?  Latest Ref Rng & Units 03/02/2020  ?  1:46 PM 03/17/2019  ?  8:10 PM 05/01/2017  ?  9:15 AM  ?CMP  ?Glucose 70 - 99 mg/dL 90   102   86    ?BUN 8 - 23 mg/dL _0 ?Creatinine 0.61 - 1.24 mg/dL 1.17   1.41   1.06    ?Sodium 135 - 145 mmol/L 141   141   137    ?Potassium 3.5 - 5.1 mmol/L 4.5   4.3   4.6    ?Chloride 98 - 111 mmol/L 103   108   104    ?CO2 22 - 32 mmol/L _1 ?Calcium 8.9 - 10.3 mg/dL 9.5   9.4   9.6    ? ? ?  Latest Ref Rng & Units 03/02/2020  ?  1:46 PM 03/17/2019  ?  8:10 PM 05/02/2018  ?  9:13 AM  ?CBC  ?WBC 4.0 - 10.5 K/uL 7.2   7.1   6.4    ?Hemoglobin 13.0 - 17.0 g/dL 14.8   15.1   15.1    ?Hematocrit 39.0 - 52.0 % 44.1   45.7   45.4    ?Platelets 150 - 400 K/uL 225   224   209    ? ?Lipid Panel  ?   ?Component Value Date/Time  ? CHOL 160 07/04/2015 0618  ? TRIG 168 (H) 07/04/2015 0618  ? HDL 35 (L) 07/04/2015 0618  ? CHOLHDL 4.6 07/04/2015 0618  ? VLDL 34 07/04/2015 0618  ? [place] 91 07/04/2015 0618  ? ?HEMOGLOBIN A1C ?Lab Results  ?Component Value Date  ? HGBA1C 5.3 07/04/2015  ? MPG 105 07/04/2015  ? ?TSH ?No results for input(s): TSH in the last 8760 hours. ? ?External Labs:  ?03/24/2019:  ?Glucose 127, Creatinine 1.37, eGFR 48, Potassium 4.4, CMP otherwise normal.  ? ?Allergies  ?No Known Allergies  ? ?Medications Prior to Visit:  ? ?Outpatient Medications Prior to Visit  ?Medication Sig Dispense Refill  ? Apoaequorin (PREVAGEN PO) Take 1 tablet by mouth daily.    ? Bacillus Coagulans-Inulin (PROBIOTIC-PREBIOTIC PO) Take 2 capsules by mouth daily.    ? Cholecalciferol (VITAMIN D3 GUMMIES PO) Take 1 tablet by mouth daily.    ? Coenzyme Q10 (COQ10) 200 MG CAPS Take 200  mg by mouth every evening.     ? ELDERBERRY PO Take 2 tablets by mouth daily.  Gummy    ? Magnesium Oxide 250 MG TABS Take 1 tablet by mouth daily.    ? Multiple Vitamin (MULTIVITAMIN WITH MINERALS) TABS tablet Take 1 tablet by mouth daily. Centrum Silver    ? rosuvastatin (CRESTOR) 5 MG tablet Take 1 tablet (5 mg total) by mouth daily. Needs office visit and labs-2nd notice (Patient taking differently: Take 5 mg by mouth every evening.) 30 tablet 0  ? Zinc 50 MG TABS Take 50 mg by mouth daily.    ? zolpidem (AMBIEN) 5 MG tablet Take 5 mg by mouth at bedtime as needed for sleep. 1 and 0.5 mg tablet as needed at bed time    ? apixaban (ELIQUIS) 5 MG TABS tablet Take 1 tablet (5 mg total) by mouth 2 (two) times daily. 60 tablet 1  ? APPLE CIDER VINEGAR PO Take 2 tablets by mouth daily. Gummy (Patient not taking: Reported on 09/09/2021)    ? ?No facility-administered medications prior to visit.  ? ?Final Medications at End of Visit   ? ?Current Meds  ?Medication Sig  ? Apoaequorin (PREVAGEN PO) Take 1 tablet by mouth daily.  ? Bacillus Coagulans-Inulin (PROBIOTIC-PREBIOTIC PO) Take 2 capsules by mouth daily.  ? Cholecalciferol (VITAMIN D3 GUMMIES PO) Take 1 tablet by mouth daily.  ? Coenzyme Q10 (COQ10) 200 MG CAPS Take 200 mg by mouth every evening.   ? ELDERBERRY PO Take 2 tablets by mouth daily. Gummy  ? Magnesium Oxide 250 MG TABS Take 1 tablet by mouth daily.  ? Multiple Vitamin (MULTIVITAMIN WITH MINERALS) TABS tablet Take 1 tablet by mouth daily. Centrum Silver  ? rosuvastatin (CRESTOR) 5 MG tablet Take 1 tablet (5 mg total) by mouth daily. Needs office visit and labs-2nd notice (Patient taking differently: Take 5 mg by mouth every evening.)  ? Zinc 50 MG TABS Take 50 mg by mouth daily.  ? zolpidem (AMBIEN) 5 MG tablet Take 5 mg by mouth at bedtime as needed for sleep. 1 and 0.5 mg tablet as needed at bed time  ? [DISCONTINUED] apixaban (ELIQUIS) 5 MG TABS tablet Take 1 tablet (5 mg total) by mouth 2 (two) times daily.  ? ?Radiology:  ? ?No results found. ? ?Cardiac Studies:   ? ?Echocardiogram 04/01/2019:  ?Left ventricle cavity is normal in size. Mild concentric hypertrophy of  ?the left ventricle. Normal LV systolic function with EF 60%. Normal global  ?wall motion. Doppler evidence of grade

## 2021-09-19 ENCOUNTER — Encounter: Payer: Self-pay | Admitting: Cardiology

## 2021-10-13 NOTE — Progress Notes (Signed)
DUE TO COVID-19 ONLY ONE VISITOR IS ALLOWED TO COME WITH YOU AND STAY IN THE WAITING ROOM ONLY DURING PRE OP AND PROCEDURE DAY OF SURGERY.  2 VISITOR  MAY VISIT WITH YOU AFTER SURGERY IN YOUR PRIVATE ROOM DURING VISITING HOURS ONLY! ?YOU MAY HAVE ONE PERSON SPEND THE NITE WITH YOU IN YOUR ROOM AFTER SURGERY.   ? ?  ? ? Your procedure is scheduled on:  ?             10/31/21  ? Report to [facility] Main  Entrance ? ? Report to admitting at     1215 PM           ?DO NOT BRING INSURANCE CARD, PICTURE ID OR WALLET DAY OF SURGERY.  ?  ? ? Call this number if you have problems the morning of surgery [phone]  ? ? REMEMBER: NO  SOLID FOODS , CANDY, GUM OR MINTS AFTER MIDNITE THE NITE BEFORE SURGERY .       Marland Kitchen CLEAR LIQUIDS UNTIL     1145AM            DAY OF SURGERY.      PLEASE FINISH ENSURE DRINK PER SURGEON ORDER  WHICH NEEDS TO BE COMPLETED AT    1145AM        MORNING OF SURGERY.   ? ? ? ? ?CLEAR LIQUID DIET ? ? ?Foods Allowed      ?WATER ?BLACK COFFEE ( SUGAR OK, NO MILK, CREAM OR CREAMER) REGULAR AND DECAF  ?TEA ( SUGAR OK NO MILK, CREAM, OR CREAMER) REGULAR AND DECAF  ?PLAIN JELLO ( NO RED)  ?FRUIT ICES ( NO RED, NO FRUIT PULP)  ?POPSICLES ( NO RED)  ?JUICE- APPLE, WHITE GRAPE AND WHITE CRANBERRY  ?SPORT DRINK LIKE GATORADE ( NO RED)  ?CLEAR BROTH ( VEGETABLE , CHICKEN OR BEEF)                                                               ? ?    ? ?BRUSH YOUR TEETH MORNING OF SURGERY AND RINSE YOUR MOUTH OUT, NO CHEWING GUM CANDY OR MINTS. ?  ? ? Take these medicines the morning of surgery with A SIP OF WATER:  NONE  ? ? ?DO NOT TAKE ANY DIABETIC MEDICATIONS DAY OF YOUR SURGERY ?                  ?            You may not have any metal on your body including hair pins and  ?            piercings  Do not wear jewelry, make-up, lotions, powders or perfumes, deodorant ?            Do not wear nail polish on your fingernails.   ?           IF YOU ARE A MALE AND WANT TO SHAVE UNDER ARMS OR LEGS PRIOR TO  SURGERY YOU MUST DO SO AT LEAST 48 HOURS PRIOR TO SURGERY.  ?            Men may shave face and neck. ? ? Do not bring valuables to the hospital. [place] NOT ?  RESPONSIBLE   FOR VALUABLES. ? Contacts, dentures or bridgework may not be worn into surgery. ? Leave suitcase in the car. After surgery it may be brought to your room. ? ?  ? Patients discharged the day of surgery will not be allowed to drive home. IF YOU ARE HAVING SURGERY AND GOING HOME THE SAME DAY, YOU MUST HAVE AN ADULT TO DRIVE YOU HOME AND BE WITH YOU FOR 24 HOURS. YOU MAY GO HOME BY TAXI OR UBER OR ORTHERWISE, BUT AN ADULT MUST ACCOMPANY YOU HOME AND STAY WITH YOU FOR 24 HOURS. ?  ? ?            Please read over the following fact sheets you were given: ?_____________________________________________________________________ ? ?[place] - Preparing for Surgery ?Before surgery, you can play an important role.  Because skin is not sterile, your skin needs to be as free of germs as possible.  You can reduce the number of germs on your skin by washing with CHG (chlorahexidine gluconate) soap before surgery.  CHG is an antiseptic cleaner which kills germs and bonds with the skin to continue killing germs even after washing. ?Please DO NOT use if you have an allergy to CHG or antibacterial soaps.  If your skin becomes reddened/irritated stop using the CHG and inform your nurse when you arrive at Short Stay. ?Do not shave (including legs and underarms) for at least 48 hours prior to the first CHG shower.  You may shave your face/neck. ?Please follow these instructions carefully: ? 1.  Shower with CHG Soap the night before surgery and the  morning of Surgery. ? 2.  If you choose to wash your hair, wash your hair first as usual with your  normal  shampoo. ? 3.  After you shampoo, rinse your hair and body thoroughly to remove the  shampoo.                           4.  Use CHG as you would any other liquid soap.  You can apply chg directly   to the skin and wash  ?                     Gently with a scrungie or clean washcloth. ? 5.  Apply the CHG Soap to your body ONLY FROM THE NECK DOWN.   Do not use on face/ open      ?                     Wound or open sores. Avoid contact with eyes, ears mouth and genitals (private parts).  ?                     Production manager,  Genitals (private parts) with your normal soap. ?            6.  Wash thoroughly, paying special attention to the area where your surgery  will be performed. ? 7.  Thoroughly rinse your body with warm water from the neck down. ? 8.  DO NOT shower/wash with your normal soap after using and rinsing off  the CHG Soap. ?               9.  Pat yourself dry with a clean towel. ?           10.  Wear clean pajamas. ?  11.  Place clean sheets on your bed the night of your first shower and do not  sleep with pets. ?Day of Surgery : ?Do not apply any lotions/deodorants the morning of surgery.  Please wear clean clothes to the hospital/surgery center. ? ?FAILURE TO FOLLOW THESE INSTRUCTIONS MAY RESULT IN THE CANCELLATION OF YOUR SURGERY ?PATIENT SIGNATURE_________________________________ ? ?NURSE SIGNATURE__________________________________ ? ?________________________________________________________________________  ? ? ?           ?

## 2021-10-13 NOTE — Progress Notes (Signed)
Anesthesia Review: ? ?PCP: ?Cardiologist : ?Chest x-ray : ?EKG : 08/12/21  ?Echo :2020  ?Stress test: 2020  ?Cardiac Cath :  ?Activity level:  ?Sleep Study/ CPAP : ?Fasting Blood Sugar :      / Checks Blood Sugar -- times a day:   ?Blood Thinner/ Instructions /Last Dose: ?ASA / Instructions/ Last Dose :   ?81 mg aspirin  ?

## 2021-10-17 ENCOUNTER — Encounter (HOSPITAL_COMMUNITY)
Admission: RE | Admit: 2021-10-17 | Discharge: 2021-10-17 | Disposition: A | Discharge status: Home / Self Care | Payer: BC Managed Care – PPO | Source: Ambulatory Visit | Attending: Internal Medicine | Admitting: Internal Medicine

## 2021-10-20 NOTE — Progress Notes (Addendum)
COVID Vaccine Completed: yes x3 ?Date COVID Vaccine completed: ?Has received booster: ?COVID vaccine manufacturer: Pfizer     ? ?Date of COVID positive in last 90 days: no ? ?PCP - Bryson Corona, FNP ?Cardiologist - Adrian Prows, MD ? ?Chest x-ray - n/a ?EKG - 08/12/21 Epic ?Stress Test - 06/03/19 Epic ?ECHO - 04/01/19 Epic ?Cardiac Cath - n/a ?Pacemaker/ICD device last checked: n/a ?Spinal Cord Stimulator: n/a ? ?Bowel Prep - no ? ?Sleep Study - yes, positive ?CPAP -  yes some night ? ?Fasting Blood Sugar - n/a ?Checks Blood Sugar _____ times a day ? ?Blood Thinner Instructions:  ?Aspirin Instructions: ASA 81, hold 7 days before surgery ?Last Dose: ? ?Activity level: Can go up a flight of stairs and perform activities of daily living without stopping and without symptoms of chest pain or shortness of breath. ?   ? ?Anesthesia review: a fib ? ?Patient denies shortness of breath, fever, cough and chest pain at PAT appointment ? ? ?Patient verbalized understanding of instructions that were given to them at the PAT appointment. Patient was also instructed that they will need to review over the PAT instructions again at home before surgery.  ?

## 2021-10-20 NOTE — H&P (Addendum)
TOTAL KNEE ADMISSION H&P ? ?Patient is being admitted for left total knee arthroplasty. ? ?Subjective: ? ?Chief Complaint: Left knee pain. ? ?HPI: Shawn Bell, 81 y.o. male has a history of pain and functional disability in the left knee due to arthritis and has failed non-surgical conservative treatments for greater than 12 weeks to include NSAID's and/or analgesics, corticosteriod injections, and activity modification. Onset of symptoms was gradual, starting several years ago with gradually worsening course since that time. The patient noted no past surgery on the left knee.  Patient currently rates pain in the left knee at 8 out of 10 with activity. Patient has night pain, worsening of pain with activity and weight bearing, and crepitus. Patient has evidence of  bone-on-bone arthritis in the medial and patellofemoral compartments of the bilateral knees  by imaging studies. There is no active infection. ? ?Patient Active Problem List  ? Diagnosis Date Noted  ? Left inguinal hernia 05/06/2018  ? S/p reverse total shoulder arthroplasty 05/03/2017  ? Weakness 07/03/2015  ? Ataxia 07/03/2015  ? Double vision 07/03/2015  ? Hyperlipemia   ? History of prostate cancer   ? Postop Hyponatremia 03/07/2012  ? OA (osteoarthritis) of hip 03/06/2012  ? Cholelithiasis with cholecystitis 01/30/2012  ? Prostate cancer ([place]) 01/02/2012  ? Lipid disorder 01/02/2012  ? Heme positive stool 01/02/2012  ? DIVERTICULOSIS-COLON 10/23/2008  ? ANAL FISSURE 10/23/2008  ? ? ?Past Medical History:  ?Diagnosis Date  ? A-fib ([place])   ? Episode x1  ? Arthritis   ? shoulders  ? Diverticulosis of colon (without mention of hemorrhage)   ? Hemorrhoids   ? History of prostate cancer   ? S/P RADIATION [place]  ? History of radiation therapy   ? Hyperlipemia   ? Incomplete RBBB 12/2019  ? Intermittent self-catheterization of bladder   ? ongoing; has been self catherizing for 5 years without any issues   ? Personal history of colonic polyps 01/23/2003  ? pt  denies  ? Prostate cancer ([place])   ? ? ?Past Surgical History:  ?Procedure Laterality Date  ? CATARACT EXTRACTION W/ INTRAOCULAR LENS  IMPLANT, BILATERAL    ? CHOLECYSTECTOMY  02/07/2012  ? Procedure: LAPAROSCOPIC CHOLECYSTECTOMY WITH INTRAOPERATIVE CHOLANGIOGRAM;  Surgeon: Earnstine Regal, MD;  Location: [facility];  Service: General;  Laterality: N/A;  ? COLONOSCOPY    ? INGUINAL HERNIA REPAIR Right 05/07/2018  ? Procedure: OPEN RIGHT INGUINAL HERNIA REPAIR WITH INSERTION OF MESH;  Surgeon: Armandina Gemma, MD;  Location: [facility];  Service: General;  Laterality: Right;  ? INGUINAL HERNIA REPAIR Left 03/12/2020  ? Procedure: OPEN LEFT INGUINAL HERNIA REPAIR WITH MESH;  Surgeon: Armandina Gemma, MD;  Location: [facility];  Service: General;  Laterality: Left;  ? JOINT REPLACEMENT    ? REVERSE SHOULDER ARTHROPLASTY Left 05/03/2017  ? Procedure: REVERSE LEFT SHOULDER ARTHROPLASTY;  Surgeon: Justice Britain, MD;  Location: [facility];  Service: Orthopedics;  Laterality: Left;  ? REVERSE SHOULDER ARTHROPLASTY Right 09/27/2017  ? Procedure: RIGHT REVERSE SHOULDER ARTHROPLASTY;  Surgeon: Justice Britain, MD;  Location: [place];  Service: Orthopedics;  Laterality: Right;  ? SHOULDER ARTHROSCOPY    ? BILATERAL  ? TOTAL HIP ARTHROPLASTY    ? right  ? TOTAL HIP ARTHROPLASTY  03/06/2012  ? Procedure: TOTAL HIP ARTHROPLASTY;  Surgeon: Gearlean Alf, MD;  Location: [facility];  Service: Orthopedics;  Laterality: Left;  ? ? ?Prior to Admission medications   ?Medication Sig Start Date End Date Taking? Authorizing Provider  ?  Apoaequorin (PREVAGEN PO) Take 1 tablet by mouth daily.   Yes [provider]  ?aspirin EC 81 MG tablet Take 81 mg by mouth at bedtime. Swallow whole.   Yes [provider]  ?Bacillus Coagulans-Inulin (PROBIOTIC-PREBIOTIC PO) Take 2 capsules by mouth daily.   Yes [provider]  ?Cholecalciferol (VITAMIN D3 GUMMIES PO) Take 1 tablet by mouth daily.   Yes [provider]  ?Coenzyme Q10 (COQ10) 200 MG CAPS Take  200 mg by mouth every evening.    Yes [provider]  ?ELDERBERRY PO Take 2 tablets by mouth daily. Gummy   Yes [provider]  ?Magnesium Oxide 250 MG TABS Take 250 mg by mouth daily.   Yes [provider]  ?Menthol, Topical Analgesic, (BIOFREEZE) 10 % CREA Apply 1 application. topically daily as needed (knee pain).   Yes [provider]  ?Multiple Vitamin (MULTIVITAMIN WITH MINERALS) TABS tablet Take 1 tablet by mouth daily. Centrum Silver   Yes [provider]  ?rosuvastatin (CRESTOR) 5 MG tablet Take 1 tablet (5 mg total) by mouth daily. Needs office visit and labs-2nd notice ?Patient taking differently: Take 5 mg by mouth every evening. 12/23/12  Yes Rise Mu, PA-C  ?Zinc 50 MG TABS Take 50 mg by mouth daily.   Yes [provider]  ?zolpidem (AMBIEN) 5 MG tablet Take 5 mg by mouth at bedtime.   Yes [provider]  ? ? ?No Known Allergies ? ?Social History  ? ?Socioeconomic History  ? Marital status: Married  ?  Spouse name: Not on file  ? Number of children: 2  ? Years of education: Not on file  ? Highest education level: Not on file  ?Occupational History  ? Occupation: Owner  ?  Employer: [employer]  ?Tobacco Use  ? Smoking status: Never  ? Smokeless tobacco: Never  ?Vaping Use  ? Vaping Use: Never used  ?Substance and Sexual Activity  ? Alcohol use: Yes  ?  Comment: rarely  ? Drug use: No  ? Sexual activity: Not on file  ?Other Topics Concern  ? Not on file  ?Social History Narrative  ? Not on file  ? ?Social Determinants of Health  ? ?Financial Resource Strain: Not on file  ?Food Insecurity: Not on file  ?Transportation Needs: Not on file  ?Physical Activity: Not on file  ?Stress: Not on file  ?Social Connections: Not on file  ?Intimate Partner Violence: Not on file  ? ? ?Tobacco Use: Low Risk   ? Smoking Tobacco Use: Never  ? Smokeless Tobacco Use: Never  ? Passive Exposure: Not on file  ? ?Social History  ? ?Substance and Sexual  Activity  ?Alcohol Use Yes  ? Comment: rarely  ? ? ?Family History  ?Problem Relation Age of Onset  ? Heart failure Mother   ? Rheumatic fever Father   ? Colon cancer Neg Hx   ? Colon polyps Neg Hx   ? Rectal cancer Neg Hx   ? Stomach cancer Neg Hx   ? ? ?Review of Systems  ?Constitutional:  Negative for chills and fever.  ?HENT: Negative.    ?Eyes: Negative.   ?Respiratory:  Negative for cough and shortness of breath.   ?Cardiovascular:  Negative for chest pain and palpitations.  ?Gastrointestinal:  Negative for abdominal pain, constipation, diarrhea, nausea and vomiting.  ?Genitourinary:  Negative for dysuria, frequency and urgency.  ?Musculoskeletal:  Positive for joint pain.  ?Skin:  Negative for rash.  ? ?  Objective: ? ?Physical Exam: ?General: Alert and oriented x3, cooperative and pleasant, no acute distress.  ?Head: normocephalic, atraumatic, neck supple.  ?Eyes: EOMI. ?Abdomen: non-tender to palpation and soft, normoactive bowel sounds. ?Musculoskeletal: ?Bilateral Hip Exam:  ?The range of motion: normal without discomfort.  ? ?Right Knee Exam:  ?No effusion present. No swelling present.  ?The range of motion is: 5 to 120 degrees.  ?Moderate crepitus on range of motion of the knee.  ?Positive medial greater than lateral joint line tenderness.  ?The knee is stable.  ? ?Left Knee Exam:  ?No effusion present. No swelling present.  ?Varus deformity.  ?The range of motion is: 5 to 120 degrees.  ?Marked crepitus on range of motion of the knee.  ?Positive medial joint line tenderness.  ?No lateral joint line tenderness.  ?The knee is stable. ? ?Calves soft and nontender. Motor function intact in LE. Strength 5/5 LE bilaterally. ?Neuro: Distal pulses 2+. Sensation to light touch intact in LE. ? ?Vital signs in last 24 hours: ?BP: ()/()  ?Arterial Line BP: ()/()  ? ?Imaging Review ?Plain radiographs demonstrate severe degenerative joint disease of the left knee. The overall alignment is mild varus. The bone quality  appears to be adequate for age and reported activity level. ? ?Assessment/Plan: ? ?End stage arthritis, left knee  ? ?The patient history, physical examination, clinical judgment of the provider and imagi

## 2021-10-20 NOTE — Patient Instructions (Addendum)
DUE TO COVID-19 ONLY TWO VISITORS  (aged 83 and older)  ARE ALLOWED TO COME WITH YOU AND STAY IN THE WAITING ROOM ONLY DURING PRE OP AND PROCEDURE.   ?**NO VISITORS ARE ALLOWED IN THE SHORT STAY AREA OR RECOVERY ROOM!!** ? ?IF YOU WILL BE ADMITTED INTO THE HOSPITAL YOU ARE ALLOWED ONLY FOUR SUPPORT PEOPLE DURING VISITATION HOURS ONLY (7 AM -8PM)   ?The support person(s) must pass our screening, gel in and out, and wear a mask at all times, including in the patient?s room. ?Patients must also wear a mask when staff or their support person are in the room. ?Visitors GUEST BADGE MUST BE WORN VISIBLY  ?One adult visitor may remain with you overnight and MUST be in the room by 8 P.M. ?  ? ? Your procedure is scheduled on: 10/31/21 ? ? Report to [facility] Main Entrance ? ?  Report to admitting at 11:00 AM ? ? Call this number if you have problems the morning of surgery [phone] ? ? Do not eat food :After Midnight. ? ? After Midnight you may have the following liquids until 10:45 AM DAY OF SURGERY ? ?Water ?Black Coffee (sugar ok, NO MILK/CREAM OR CREAMERS)  ?Tea (sugar ok, NO MILK/CREAM OR CREAMERS) regular and decaf                             ?Plain Jell-O (NO RED)                                           ?Fruit ices (not with fruit pulp, NO RED)                                     ?Popsicles (NO RED)                                                                  ?Juice: apple, WHITE grape, WHITE cranberry ?Sports drinks like Gatorade (NO RED) ?Clear broth(vegetable,chicken,beef) ?  ?  ?The day of surgery:  ?Drink ONE (1) Pre-Surgery Clear Ensure at 10:45 AM the morning of surgery. Drink in one sitting. Do not sip.  ?This drink was given to you during your hospital  ?pre-op appointment visit. ?Nothing else to drink after completing the  ?Pre-Surgery Clear Ensure. ?  ?       If you have questions, please contact your surgeon?s office. ? ? ?FOLLOW BOWEL PREP AND ANY ADDITIONAL PRE OP INSTRUCTIONS YOU  RECEIVED FROM YOUR SURGEON'S OFFICE!!! ?  ?  ?Oral Hygiene is also important to reduce your risk of infection.                                    ?Remember - BRUSH YOUR TEETH THE MORNING OF SURGERY WITH YOUR REGULAR TOOTHPASTE ? ? Take these medicines the morning of surgery with A SIP OF WATER: None ? ?Bring CPAP mask and tubing day of surgery. ?                  ?  You may not have any metal on your body including jewelry, and body piercing ? ?           Do not wear lotions, powders, cologne, or deodorant ? ?            Men may shave face and neck. ? ? Do not bring valuables to the hospital. [place] NOT ?            RESPONSIBLE   FOR VALUABLES. ? ? Bring small overnight bag day of surgery. ? ?            Please read over the following fact sheets you were given: IF [place] [phone]- Apolonio Schneiders ? ?   [place] - Preparing for Surgery ?Before surgery, you can play an important role.  Because skin is not sterile, your skin needs to be as free of germs as possible.  You can reduce the number of germs on your skin by washing with CHG (chlorahexidine gluconate) soap before surgery.  CHG is an antiseptic cleaner which kills germs and bonds with the skin to continue killing germs even after washing. ?Please DO NOT use if you have an allergy to CHG or antibacterial soaps.  If your skin becomes reddened/irritated stop using the CHG and inform your nurse when you arrive at Short Stay. ?Do not shave (including legs and underarms) for at least 48 hours prior to the first CHG shower.  You may shave your face/neck. ? ?Please follow these instructions carefully: ? 1.  Shower with CHG Soap the night before surgery and the  morning of surgery. ? 2.  If you choose to wash your hair, wash your hair first as usual with your normal  shampoo. ? 3.  After you shampoo, rinse your hair and body thoroughly to remove the shampoo.                            ? 4.  Use CHG  as you would any other liquid soap.  You can apply chg directly to the skin and wash.  Gently with a scrungie or clean washcloth. ? 5.  Apply the CHG Soap to your body ONLY FROM THE NECK DOWN.   Do   not use on face/ open      ?                     Wound or open sores. Avoid contact with eyes, ears mouth and   genitals (private parts).  ?                     Production manager,  Genitals (private parts) with your normal soap. ?            6.  Wash thoroughly, paying special attention to the area where your    surgery  will be performed. ? 7.  Thoroughly rinse your body with warm water from the neck down. ? 8.  DO NOT shower/wash with your normal soap after using and rinsing off the CHG Soap. ?               9.  Pat yourself dry with a clean towel. ?           10.  Wear clean pajamas. ?           11.  Place clean sheets on  your bed the night of your first shower and do not  sleep with pets. ?Day of Surgery : ?Do not apply any lotions/deodorants the morning of surgery.  Please wear clean clothes to the hospital/surgery center. ? ?FAILURE TO FOLLOW THESE INSTRUCTIONS MAY RESULT IN THE CANCELLATION OF YOUR SURGERY ? ?PATIENT SIGNATURE_________________________________ ? ?NURSE SIGNATURE__________________________________ ? ?________________________________________________________________________  ? ?Incentive Spirometer ? ?An incentive spirometer is a tool that can help keep your lungs clear and active. This tool measures how well you are filling your lungs with each breath. Taking long deep breaths may help reverse or decrease the chance of developing breathing (pulmonary) problems (especially infection) following: ?A long period of time when you are unable to move or be active. ?BEFORE THE PROCEDURE  ?If the spirometer includes an indicator to show your best effort, your nurse or respiratory therapist will set it to a desired goal. ?If possible, sit up straight or lean slightly forward. Try not to slouch. ?Hold the incentive  spirometer in an upright position. ?INSTRUCTIONS FOR USE  ?Sit on the edge of your bed if possible, or sit up as far as you can in bed or on a chair. ?Hold the incentive spirometer in an upright position. ?Breathe out normally. ?Place the mouthpiece in your mouth and seal your lips tightly around it. ?Breathe in slowly and as deeply as possible, raising the piston or the ball toward the top of the column. ?Hold your breath for 3-5 seconds or for as long as possible. Allow the piston or ball to fall to the bottom of the column. ?Remove the mouthpiece from your mouth and breathe out normally. ?Rest for a few seconds and repeat Steps 1 through 7 at least 10 times every 1-2 hours when you are awake. Take your time and take a few normal breaths between deep breaths. ?The spirometer may include an indicator to show your best effort. Use the indicator as a goal to work toward during each repetition. ?After each set of 10 deep breaths, practice coughing to be sure your lungs are clear. If you have an incision (the cut made at the time of surgery), support your incision when coughing by placing a pillow or rolled up towels firmly against it. ?Once you are able to get out of bed, walk around indoors and cough well. You may stop using the incentive spirometer when instructed by your caregiver.  ?RISKS AND COMPLICATIONS ?Take your time so you do not get dizzy or light-headed. ?If you are in pain, you may need to take or ask for pain medication before doing incentive spirometry. It is harder to take a deep breath if you are having pain. ?AFTER USE ?Rest and breathe slowly and easily. ?It can be helpful to keep track of a log of your progress. Your caregiver can provide you with a simple table to help with this. ?If you are using the spirometer at home, follow these instructions: ?SEEK MEDICAL CARE IF:  ?You are having difficultly using the spirometer. ?You have trouble using the spirometer as often as instructed. ?Your pain  medication is not giving enough relief while using the spirometer. ?You develop fever of 100.5? F (38.1? C) or higher. ?SEEK IMMEDIATE MEDICAL CARE IF:  ?You cough up bloody sputum that had not been present befor

## 2021-10-21 ENCOUNTER — Encounter (HOSPITAL_COMMUNITY)
Admission: RE | Admit: 2021-10-21 | Discharge: 2021-10-21 | Disposition: A | Discharge status: Home / Self Care | Payer: BC Managed Care – PPO | Source: Ambulatory Visit | Attending: Orthopedic Surgery | Admitting: Orthopedic Surgery

## 2021-10-21 ENCOUNTER — Encounter (HOSPITAL_COMMUNITY): Payer: Self-pay

## 2021-10-21 VITALS — BP 127/87 | HR 91 | Temp 97.8°F | Resp 14 | Ht 73.0 in

## 2021-10-21 DIAGNOSIS — I2510 Atherosclerotic heart disease of native coronary artery without angina pectoris: Secondary | ICD-10-CM

## 2021-10-21 DIAGNOSIS — Z01818 Encounter for other preprocedural examination: Secondary | ICD-10-CM

## 2021-10-21 DIAGNOSIS — Z01812 Encounter for preprocedural laboratory examination: Secondary | ICD-10-CM | POA: Insufficient documentation

## 2021-10-21 LAB — SURGICAL PCR SCREEN
MRSA, PCR: NEGATIVE
Staphylococcus aureus: NEGATIVE

## 2021-10-26 NOTE — Progress Notes (Signed)
Anesthesia Chart Review ? ? Case: [id number] Date/Time: 10/31/21 1330  ? Procedure: TOTAL KNEE ARTHROPLASTY (Left: Knee)  ? Anesthesia type: Choice  ? Pre-op diagnosis: Left knee osteoarthritis  ? Location: [facility] 09 / [facility]  ? Surgeons: Gaynelle Arabian, MD  ? ?  ? ? ?DISCUSSION:81 y.o. never smoker with h/o prostate cancer, a-fib, left knee OA scheduled for above procedure 10/31/2021 with Dr. Gaynelle Arabian.  ? ?Pt last seen by cardiology 09/09/2021. Per OV note stable from a cardiovascular standpoint with 1 year follow up recommended.   ? ?Clearance on chart from PCP which states pt is low risk.  ? ?Anticipate pt can proceed with planned procedure barring acute status change.   ?VS: BP 127/87   Pulse 91   Temp 36.6 ?C (Oral)   Resp 14   Ht '6\' 1"'$  (1.854 m)   SpO2 97%   BMI 27.97 kg/m?  ? ?PROVIDERS: ?Talbott, Brennan Bailey, FNP is PCP  ? ?Cardiologist - Adrian Prows, MD ? ?LABS: Labs reviewed: Acceptable for surgery. and labs on chart ?(all labs ordered are listed, but only abnormal results are displayed) ? ?Labs Reviewed  ?SURGICAL PCR SCREEN  ? ? ? ?IMAGES: ? ? ?EKG: ?08/12/2021 ?Rate 87 bpm  ?Sinus  Rhythm  ?Low voltage in precordial leads.  ? -Incomplete right bundle branch block.  ? ? ?CV: ?Lexiscan Tetrofosmin Stress Test  06/02/2019: ?Nondiagnostic ECG stress due to [facility] pharmacologic stress.  ?Normal myocardial perfusion. All segments of left ventricle demonstrated normal wall motion and thickening. No stress lung uptake. Calculated Stress LV EF is mildly dysfunctional 48%. however appears normal visually.  ?No previous exam available for comparison. Low risk study.  ? ?Echocardiogram 04/01/2019:  ?Left ventricle cavity is normal in size. Mild concentric hypertrophy of  ?the left ventricle. Normal LV systolic function with EF 60%. Normal global  ?wall motion. Doppler evidence of grade I (impaired) diastolic dysfunction,  ?normal LAP.  ?Left atrial cavity is normal in size.  ?Trileaflet aortic valve.   Trace aortic valve stenosis. Moderate (Grade II)  ?aortic regurgitation.  ?Mild tricuspid regurgitation.  ?Trace pulmonic regurgitation.  ?No evidence of pulmonary hypertension. ?Past Medical History:  ?Diagnosis Date  ? A-fib ([place])   ? Episode x1  ? Arthritis   ? shoulders  ? Diverticulosis of colon (without mention of hemorrhage)   ? Hemorrhoids   ? History of prostate cancer   ? S/P RADIATION [place]  ? History of radiation therapy   ? Hyperlipemia   ? Incomplete RBBB 12/2019  ? Intermittent self-catheterization of bladder   ? ongoing; has been self catherizing for 5 years without any issues   ? Personal history of colonic polyps 01/23/2003  ? pt denies  ? Prostate cancer ([place])   ? ? ?Past Surgical History:  ?Procedure Laterality Date  ? CATARACT EXTRACTION W/ INTRAOCULAR LENS  IMPLANT, BILATERAL    ? CHOLECYSTECTOMY  02/07/2012  ? Procedure: LAPAROSCOPIC CHOLECYSTECTOMY WITH INTRAOPERATIVE CHOLANGIOGRAM;  Surgeon: Earnstine Regal, MD;  Location: [facility];  Service: General;  Laterality: N/A;  ? COLONOSCOPY    ? INGUINAL HERNIA REPAIR Right 05/07/2018  ? Procedure: OPEN RIGHT INGUINAL HERNIA REPAIR WITH INSERTION OF MESH;  Surgeon: Armandina Gemma, MD;  [facility];  Service: General;  Laterality: Right;  ? INGUINAL HERNIA REPAIR Left 03/12/2020  ? Procedure: OPEN LEFT INGUINAL HERNIA REPAIR WITH MESH;  Surgeon: Armandina Gemma, MD;  Location: [facility];  Service: General;  Laterality: Left;  ? JOINT REPLACEMENT    ?  REVERSE SHOULDER ARTHROPLASTY Left 05/03/2017  ? Procedure: REVERSE LEFT SHOULDER ARTHROPLASTY;  Surgeon: Justice Britain, MD;  Location: [place];  Service: Orthopedics;  Laterality: Left;  ? REVERSE SHOULDER ARTHROPLASTY Right 09/27/2017  ? Procedure: RIGHT REVERSE SHOULDER ARTHROPLASTY;  Surgeon: Justice Britain, MD;  Location: [place];  Service: Orthopedics;  Laterality: Right;  ? SHOULDER ARTHROSCOPY    ? BILATERAL  ? TOTAL HIP ARTHROPLASTY    ? right  ? TOTAL HIP ARTHROPLASTY  03/06/2012  ? Procedure: TOTAL HIP  ARTHROPLASTY;  Surgeon: Gearlean Alf, MD;  Location[facility];  Service: Orthopedics;  Laterality: Left;  ? ? ?MEDICATIONS: ? Apoaequorin (PREVAGEN PO)  ? aspirin EC 81 MG tablet  ? Bacillus Coagulans-Inulin (PROBIOTIC-PREBIOTIC PO)  ? Cholecalciferol (VITAMIN D3 GUMMIES PO)  ? Coenzyme Q10 (COQ10) 200 MG CAPS  ? ELDERBERRY PO  ? Magnesium Oxide 250 MG TABS  ? Menthol, Topical Analgesic, (BIOFREEZE) 10 % CREA  ? Multiple Vitamin (MULTIVITAMIN WITH MINERALS) TABS tablet  ? rosuvastatin (CRESTOR) 5 MG tablet  ? Zinc 50 MG TABS  ? zolpidem (AMBIEN) 5 MG tablet  ? ?No current facility-administered medications for this encounter.  ? ? ? ?Konrad Felix Ward, PA-C ?[facility] ?[phone] ? ? ? ? ? ?

## 2021-10-31 ENCOUNTER — Ambulatory Visit (HOSPITAL_COMMUNITY): Payer: BC Managed Care – PPO | Admitting: Registered Nurse

## 2021-10-31 ENCOUNTER — Observation Stay (HOSPITAL_COMMUNITY)
Admission: RE | Admit: 2021-10-31 | Discharge: 2021-11-01 | Disposition: A | Discharge status: Home / Self Care | Payer: BC Managed Care – PPO | Source: Ambulatory Visit | Attending: Orthopedic Surgery | Admitting: Orthopedic Surgery

## 2021-10-31 ENCOUNTER — Ambulatory Visit (HOSPITAL_COMMUNITY): Payer: BC Managed Care – PPO | Admitting: Physician Assistant

## 2021-10-31 ENCOUNTER — Encounter (HOSPITAL_COMMUNITY)
Admission: RE | Disposition: A | Discharge status: Home / Self Care | Payer: Self-pay | Source: Ambulatory Visit | Attending: Orthopedic Surgery

## 2021-10-31 ENCOUNTER — Other Ambulatory Visit: Payer: Self-pay

## 2021-10-31 ENCOUNTER — Encounter (HOSPITAL_COMMUNITY): Payer: Self-pay | Admitting: Orthopedic Surgery

## 2021-10-31 DIAGNOSIS — M179 Osteoarthritis of knee, unspecified: Secondary | ICD-10-CM | POA: Diagnosis present

## 2021-10-31 DIAGNOSIS — G8918 Other acute postprocedural pain: Secondary | ICD-10-CM | POA: Diagnosis not present

## 2021-10-31 DIAGNOSIS — N35813 Other membranous urethral stricture, male: Secondary | ICD-10-CM | POA: Diagnosis not present

## 2021-10-31 DIAGNOSIS — Z8546 Personal history of malignant neoplasm of prostate: Secondary | ICD-10-CM | POA: Insufficient documentation

## 2021-10-31 DIAGNOSIS — Z7982 Long term (current) use of aspirin: Secondary | ICD-10-CM | POA: Insufficient documentation

## 2021-10-31 DIAGNOSIS — Z79899 Other long term (current) drug therapy: Secondary | ICD-10-CM | POA: Diagnosis not present

## 2021-10-31 DIAGNOSIS — Z96612 Presence of left artificial shoulder joint: Secondary | ICD-10-CM | POA: Insufficient documentation

## 2021-10-31 DIAGNOSIS — Z923 Personal history of irradiation: Secondary | ICD-10-CM | POA: Diagnosis not present

## 2021-10-31 DIAGNOSIS — Z96611 Presence of right artificial shoulder joint: Secondary | ICD-10-CM | POA: Insufficient documentation

## 2021-10-31 DIAGNOSIS — M1711 Unilateral primary osteoarthritis, right knee: Secondary | ICD-10-CM | POA: Diagnosis present

## 2021-10-31 DIAGNOSIS — M1712 Unilateral primary osteoarthritis, left knee: Principal | ICD-10-CM | POA: Insufficient documentation

## 2021-10-31 DIAGNOSIS — Z96643 Presence of artificial hip joint, bilateral: Secondary | ICD-10-CM | POA: Diagnosis not present

## 2021-10-31 HISTORY — DX: Osteoarthritis of knee, unspecified: M17.9

## 2021-10-31 HISTORY — PX: CYSTOSCOPY: SHX5120

## 2021-10-31 HISTORY — PX: TOTAL KNEE ARTHROPLASTY: SHX125

## 2021-10-31 SURGERY — ARTHROPLASTY, KNEE, TOTAL
Anesthesia: Spinal | Site: Urethra | Laterality: Left

## 2021-10-31 MED ORDER — RIVAROXABAN 10 MG PO TABS
10.0000 mg | ORAL_TABLET | Freq: Every day | ORAL | Status: DC
  Administered 2021-11-01: 10 mg via ORAL
  Filled 2021-10-31: qty 1

## 2021-10-31 MED ORDER — DEXAMETHASONE SODIUM PHOSPHATE 10 MG/ML IJ SOLN
10.0000 mg | Freq: Once | INTRAMUSCULAR | Status: AC
  Administered 2021-11-01: 10 mg via INTRAVENOUS
  Filled 2021-10-31: qty 1

## 2021-10-31 MED ORDER — AMISULPRIDE (ANTIEMETIC) 5 MG/2ML IV SOLN: 10.0000 mg | Freq: Once | INTRAVENOUS | Status: DC | PRN

## 2021-10-31 MED ORDER — ROPIVACAINE HCL 5 MG/ML IJ SOLN
INTRAMUSCULAR | Status: DC | PRN
  Administered 2021-10-31: 20 mL via PERINEURAL

## 2021-10-31 MED ORDER — SODIUM CHLORIDE 0.9 % IR SOLN
Status: DC | PRN
  Administered 2021-10-31 (×2): 1000 mL

## 2021-10-31 MED ORDER — POLYETHYLENE GLYCOL 3350 17 G PO PACK: 17.0000 g | PACK | Freq: Every day | ORAL | Status: DC | PRN

## 2021-10-31 MED ORDER — BISACODYL 10 MG RE SUPP: 10.0000 mg | Freq: Every day | RECTAL | Status: DC | PRN

## 2021-10-31 MED ORDER — FENTANYL CITRATE PF 50 MCG/ML IJ SOSY
50.0000 ug | PREFILLED_SYRINGE | INTRAMUSCULAR | Status: DC
  Administered 2021-10-31: 50 ug via INTRAVENOUS
  Filled 2021-10-31: qty 2

## 2021-10-31 MED ORDER — BUPIVACAINE LIPOSOME 1.3 % IJ SUSP
INTRAMUSCULAR | Status: DC | PRN
  Administered 2021-10-31: 20 mL

## 2021-10-31 MED ORDER — PHENYLEPHRINE HCL (PRESSORS) 10 MG/ML IV SOLN
INTRAVENOUS | Status: AC
  Filled 2021-10-31: qty 1

## 2021-10-31 MED ORDER — EPHEDRINE SULFATE-NACL 50-0.9 MG/10ML-% IV SOSY
PREFILLED_SYRINGE | INTRAVENOUS | Status: DC | PRN
  Administered 2021-10-31 (×3): 5 mg via INTRAVENOUS

## 2021-10-31 MED ORDER — PHENOL 1.4 % MT LIQD: 1.0000 | OROMUCOSAL | Status: DC | PRN

## 2021-10-31 MED ORDER — BUPIVACAINE LIPOSOME 1.3 % IJ SUSP
INTRAMUSCULAR | Status: AC
  Filled 2021-10-31: qty 20

## 2021-10-31 MED ORDER — METHOCARBAMOL 500 MG IVPB - SIMPLE MED: 500.0000 mg | Freq: Four times a day (QID) | INTRAVENOUS | Status: DC | PRN

## 2021-10-31 MED ORDER — SODIUM CHLORIDE (PF) 0.9 % IJ SOLN
INTRAMUSCULAR | Status: AC
  Filled 2021-10-31: qty 10

## 2021-10-31 MED ORDER — LACTATED RINGERS IV SOLN: INTRAVENOUS | Status: DC

## 2021-10-31 MED ORDER — DEXAMETHASONE SODIUM PHOSPHATE 10 MG/ML IJ SOLN
8.0000 mg | Freq: Once | INTRAMUSCULAR | Status: AC
  Administered 2021-10-31: 8 mg via INTRAVENOUS

## 2021-10-31 MED ORDER — SODIUM CHLORIDE (PF) 0.9 % IJ SOLN
INTRAMUSCULAR | Status: DC | PRN
  Administered 2021-10-31: 60 mL

## 2021-10-31 MED ORDER — SODIUM CHLORIDE 0.9 % IV SOLN: INTRAVENOUS | Status: DC

## 2021-10-31 MED ORDER — ACETAMINOPHEN 10 MG/ML IV SOLN
1000.0000 mg | Freq: Once | INTRAVENOUS | Status: AC
  Administered 2021-10-31: 1000 mg via INTRAVENOUS
  Filled 2021-10-31: qty 100

## 2021-10-31 MED ORDER — ONDANSETRON HCL 4 MG/2ML IJ SOLN
INTRAMUSCULAR | Status: DC | PRN
  Administered 2021-10-31: 4 mg via INTRAVENOUS

## 2021-10-31 MED ORDER — DEXAMETHASONE SODIUM PHOSPHATE 10 MG/ML IJ SOLN
INTRAMUSCULAR | Status: AC
  Filled 2021-10-31: qty 1

## 2021-10-31 MED ORDER — METOCLOPRAMIDE HCL 5 MG PO TABS: 5.0000 mg | ORAL_TABLET | Freq: Three times a day (TID) | ORAL | Status: DC | PRN

## 2021-10-31 MED ORDER — DOCUSATE SODIUM 100 MG PO CAPS
100.0000 mg | ORAL_CAPSULE | Freq: Two times a day (BID) | ORAL | Status: DC
  Administered 2021-11-01: 100 mg via ORAL
  Filled 2021-10-31: qty 1

## 2021-10-31 MED ORDER — DIPHENHYDRAMINE HCL 12.5 MG/5ML PO ELIX: 12.5000 mg | ORAL_SOLUTION | ORAL | Status: DC | PRN

## 2021-10-31 MED ORDER — FLEET ENEMA 7-19 GM/118ML RE ENEM: 1.0000 | ENEMA | Freq: Once | RECTAL | Status: DC | PRN

## 2021-10-31 MED ORDER — BUPIVACAINE LIPOSOME 1.3 % IJ SUSP: 20.0000 mL | Freq: Once | INTRAMUSCULAR | Status: DC

## 2021-10-31 MED ORDER — ONDANSETRON HCL 4 MG/2ML IJ SOLN
INTRAMUSCULAR | Status: AC
  Filled 2021-10-31: qty 2

## 2021-10-31 MED ORDER — PROPOFOL 500 MG/50ML IV EMUL
INTRAVENOUS | Status: DC | PRN
  Administered 2021-10-31: 40 ug/kg/min via INTRAVENOUS

## 2021-10-31 MED ORDER — EPHEDRINE 5 MG/ML INJ
INTRAVENOUS | Status: AC
  Filled 2021-10-31: qty 5

## 2021-10-31 MED ORDER — MENTHOL 3 MG MT LOZG: 1.0000 | LOZENGE | OROMUCOSAL | Status: DC | PRN

## 2021-10-31 MED ORDER — ROSUVASTATIN CALCIUM 5 MG PO TABS: 5.0000 mg | ORAL_TABLET | Freq: Every day | ORAL | Status: DC

## 2021-10-31 MED ORDER — SODIUM CHLORIDE (PF) 0.9 % IJ SOLN
INTRAMUSCULAR | Status: AC
  Filled 2021-10-31: qty 50

## 2021-10-31 MED ORDER — BUPIVACAINE IN DEXTROSE 0.75-8.25 % IT SOLN
INTRATHECAL | Status: DC | PRN
  Administered 2021-10-31: 1.6 mL via INTRATHECAL

## 2021-10-31 MED ORDER — ONDANSETRON HCL 4 MG PO TABS: 4.0000 mg | ORAL_TABLET | Freq: Four times a day (QID) | ORAL | Status: DC | PRN

## 2021-10-31 MED ORDER — PROPOFOL 1000 MG/100ML IV EMUL
INTRAVENOUS | Status: AC
  Filled 2021-10-31: qty 100

## 2021-10-31 MED ORDER — STERILE WATER FOR IRRIGATION IR SOLN
Status: DC | PRN
  Administered 2021-10-31: 2000 mL

## 2021-10-31 MED ORDER — FENTANYL CITRATE PF 50 MCG/ML IJ SOSY: 25.0000 ug | PREFILLED_SYRINGE | INTRAMUSCULAR | Status: DC | PRN

## 2021-10-31 MED ORDER — METHOCARBAMOL 500 MG PO TABS
500.0000 mg | ORAL_TABLET | Freq: Four times a day (QID) | ORAL | Status: DC | PRN
  Administered 2021-10-31 – 2021-11-01 (×3): 500 mg via ORAL
  Filled 2021-10-31 (×3): qty 1

## 2021-10-31 MED ORDER — CHLORHEXIDINE GLUCONATE 0.12 % MT SOLN
15.0000 mL | Freq: Once | OROMUCOSAL | Status: AC
  Administered 2021-10-31: 15 mL via OROMUCOSAL

## 2021-10-31 MED ORDER — TRAMADOL HCL 50 MG PO TABS
50.0000 mg | ORAL_TABLET | Freq: Four times a day (QID) | ORAL | Status: DC | PRN
  Administered 2021-10-31 – 2021-11-01 (×2): 100 mg via ORAL
  Filled 2021-10-31 (×2): qty 2

## 2021-10-31 MED ORDER — METOCLOPRAMIDE HCL 5 MG/ML IJ SOLN: 5.0000 mg | Freq: Three times a day (TID) | INTRAMUSCULAR | Status: DC | PRN

## 2021-10-31 MED ORDER — TRANEXAMIC ACID-NACL 1000-0.7 MG/100ML-% IV SOLN
1000.0000 mg | INTRAVENOUS | Status: AC
  Administered 2021-10-31: 1000 mg via INTRAVENOUS
  Filled 2021-10-31: qty 100

## 2021-10-31 MED ORDER — ORAL CARE MOUTH RINSE: 15.0000 mL | Freq: Once | OROMUCOSAL | Status: AC

## 2021-10-31 MED ORDER — CEFAZOLIN SODIUM-DEXTROSE 2-4 GM/100ML-% IV SOLN
2.0000 g | Freq: Four times a day (QID) | INTRAVENOUS | Status: AC
  Administered 2021-10-31 – 2021-11-01 (×2): 2 g via INTRAVENOUS
  Filled 2021-10-31 (×2): qty 100

## 2021-10-31 MED ORDER — ACETAMINOPHEN 500 MG PO TABS
1000.0000 mg | ORAL_TABLET | Freq: Four times a day (QID) | ORAL | Status: DC
  Administered 2021-11-01 (×2): 1000 mg via ORAL
  Filled 2021-10-31 (×2): qty 2

## 2021-10-31 MED ORDER — OXYCODONE HCL 5 MG PO TABS
5.0000 mg | ORAL_TABLET | ORAL | Status: DC | PRN
  Administered 2021-10-31 – 2021-11-01 (×2): 10 mg via ORAL
  Administered 2021-11-01: 5 mg via ORAL
  Filled 2021-10-31 (×3): qty 2

## 2021-10-31 MED ORDER — POVIDONE-IODINE 10 % EX SWAB
2.0000 "application " | Freq: Once | CUTANEOUS | Status: AC
  Administered 2021-10-31: 2 via TOPICAL

## 2021-10-31 MED ORDER — CEFAZOLIN SODIUM-DEXTROSE 2-4 GM/100ML-% IV SOLN
2.0000 g | INTRAVENOUS | Status: AC
  Administered 2021-10-31: 2 g via INTRAVENOUS
  Filled 2021-10-31: qty 100

## 2021-10-31 MED ORDER — MORPHINE SULFATE (PF) 2 MG/ML IV SOLN: 1.0000 mg | INTRAVENOUS | Status: DC | PRN

## 2021-10-31 MED ORDER — PHENYLEPHRINE HCL-NACL 20-0.9 MG/250ML-% IV SOLN
INTRAVENOUS | Status: DC | PRN
  Administered 2021-10-31: 25 ug/min via INTRAVENOUS

## 2021-10-31 MED ORDER — ROSUVASTATIN CALCIUM 5 MG PO TABS: 5.0000 mg | ORAL_TABLET | Freq: Every evening | ORAL | Status: DC

## 2021-10-31 MED ORDER — ONDANSETRON HCL 4 MG/2ML IJ SOLN: 4.0000 mg | Freq: Four times a day (QID) | INTRAMUSCULAR | Status: DC | PRN

## 2021-10-31 SURGICAL SUPPLY — 70 items
ATTUNE MED DOME PAT 38 KNEE (Knees) ×1 IMPLANT
ATTUNE PS FEM LT SZ 8 CEM KNEE (Femur) ×1 IMPLANT
ATTUNE PSRP INSR SZ8 12 KNEE (Insert) ×1 IMPLANT
BAG COUNTER SPONGE SURGICOUNT (BAG) ×1 IMPLANT
BAG SPEC THK2 15X12 ZIP CLS (MISCELLANEOUS) ×2
BAG SPNG CNTER NS LX DISP (BAG) ×2
BAG ZIPLOCK 12X15 (MISCELLANEOUS) ×3 IMPLANT
BASE TIBIAL ROT PLAT SZ 8 KNEE (Knees) IMPLANT
BLADE SAG 18X100X1.27 (BLADE) ×3 IMPLANT
BLADE SAW SGTL 11.0X1.19X90.0M (BLADE) ×3 IMPLANT
BNDG ELASTIC 6X5.8 VLCR STR LF (GAUZE/BANDAGES/DRESSINGS) ×3 IMPLANT
BOWL SMART MIX CTS (DISPOSABLE) ×3 IMPLANT
BSPLAT TIB 8 CMNT ROT PLAT STR (Knees) ×2 IMPLANT
CATH COUDE FOLEY 5CC 14FR (CATHETERS) ×1 IMPLANT
CATH FOLEY 2W COUNCIL 5CC 16FR (CATHETERS) ×1 IMPLANT
CEMENT HV SMART SET (Cement) ×6 IMPLANT
CLSR STERI-STRIP ANTIMIC 1/2X4 (GAUZE/BANDAGES/DRESSINGS) ×1 IMPLANT
COVER SURGICAL LIGHT HANDLE (MISCELLANEOUS) ×3 IMPLANT
CUFF TOURN SGL QUICK 34 (TOURNIQUET CUFF) ×3
CUFF TRNQT CYL 34X4.125X (TOURNIQUET CUFF) ×2 IMPLANT
DRAPE INCISE IOBAN 66X45 STRL (DRAPES) ×3 IMPLANT
DRAPE U-SHAPE 47X51 STRL (DRAPES) ×3 IMPLANT
DRESSING MEPILEX FLEX 4X4 (GAUZE/BANDAGES/DRESSINGS) IMPLANT
DRSG AQUACEL AG ADV 3.5X10 (GAUZE/BANDAGES/DRESSINGS) ×3 IMPLANT
DRSG MEPILEX FLEX 4X4 (GAUZE/BANDAGES/DRESSINGS) ×3
DURAPREP 26ML APPLICATOR (WOUND CARE) ×3 IMPLANT
ELECT REM PT RETURN 15FT ADLT (MISCELLANEOUS) ×3 IMPLANT
GAUZE SPONGE 2X2 8PLY STRL LF (GAUZE/BANDAGES/DRESSINGS) IMPLANT
GLOVE BIO SURGEON STRL SZ 6.5 (GLOVE) ×3 IMPLANT
GLOVE BIO SURGEON STRL SZ7.5 (GLOVE) ×1 IMPLANT
GLOVE BIO SURGEON STRL SZ8 (GLOVE) ×3 IMPLANT
GLOVE BIOGEL PI IND STRL 6.5 (GLOVE) IMPLANT
GLOVE BIOGEL PI IND STRL 7.0 (GLOVE) IMPLANT
GLOVE BIOGEL PI IND STRL 8 (GLOVE) ×2 IMPLANT
GLOVE BIOGEL PI INDICATOR 6.5 (GLOVE)
GLOVE BIOGEL PI INDICATOR 7.0 (GLOVE) ×3
GLOVE BIOGEL PI INDICATOR 8 (GLOVE) ×1
GOWN STRL REUS W/ TWL LRG LVL3 (GOWN DISPOSABLE) ×2 IMPLANT
GOWN STRL REUS W/ TWL XL LVL3 (GOWN DISPOSABLE) IMPLANT
GOWN STRL REUS W/TWL LRG LVL3 (GOWN DISPOSABLE) ×3
GOWN STRL REUS W/TWL XL LVL3 (GOWN DISPOSABLE) ×9
GUIDEWIRE STR DUAL SENSOR (WIRE) ×1 IMPLANT
HANDPIECE INTERPULSE COAX TIP (DISPOSABLE) ×3
HOLDER FOLEY CATH W/STRAP (MISCELLANEOUS) ×1 IMPLANT
IMMOBILIZER KNEE 20 (SOFTGOODS) ×3
IMMOBILIZER KNEE 20 THIGH 36 (SOFTGOODS) ×2 IMPLANT
KIT TURNOVER KIT A (KITS) IMPLANT
MANIFOLD NEPTUNE II (INSTRUMENTS) ×3 IMPLANT
NS IRRIG 1000ML POUR BTL (IV SOLUTION) ×3 IMPLANT
PACK TOTAL KNEE CUSTOM (KITS) ×3 IMPLANT
PADDING CAST COTTON 6X4 STRL (CAST SUPPLIES) ×6 IMPLANT
PADDING CAST SYN 6 (CAST SUPPLIES) ×1
PADDING CAST SYNTHETIC 6X4 NS (CAST SUPPLIES) IMPLANT
PROTECTOR NERVE ULNAR (MISCELLANEOUS) ×3 IMPLANT
SET CYSTO W/LG BORE CLAMP LF (SET/KITS/TRAYS/PACK) ×1 IMPLANT
SET HNDPC FAN SPRY TIP SCT (DISPOSABLE) ×2 IMPLANT
SPIKE FLUID TRANSFER (MISCELLANEOUS) ×3 IMPLANT
SPONGE GAUZE 2X2 STER 10/PKG (GAUZE/BANDAGES/DRESSINGS) ×1
STRIP CLOSURE SKIN 1/2X4 (GAUZE/BANDAGES/DRESSINGS) ×6 IMPLANT
SUT MNCRL AB 4-0 PS2 18 (SUTURE) ×3 IMPLANT
SUT STRATAFIX 0 PDS 27 VIOLET (SUTURE) ×3
SUT VIC AB 2-0 CT1 27 (SUTURE) ×9
SUT VIC AB 2-0 CT1 TAPERPNT 27 (SUTURE) ×6 IMPLANT
SUTURE STRATFX 0 PDS 27 VIOLET (SUTURE) ×2 IMPLANT
TIBIAL BASE ROT PLAT SZ 8 KNEE (Knees) ×3 IMPLANT
TOWEL OR 17X26 10 PK STRL BLUE (TOWEL DISPOSABLE) ×1 IMPLANT
TRAY FOLEY MTR SLVR 16FR STAT (SET/KITS/TRAYS/PACK) ×4 IMPLANT
TUBE SUCTION HIGH CAP CLEAR NV (SUCTIONS) ×3 IMPLANT
WATER STERILE IRR 1000ML POUR (IV SOLUTION) ×6 IMPLANT
WRAP KNEE MAXI GEL POST OP (GAUZE/BANDAGES/DRESSINGS) ×3 IMPLANT

## 2021-10-31 NOTE — Anesthesia Preprocedure Evaluation (Signed)
Anesthesia Evaluation  ?Patient identified by MRN, date of birth, ID band ?Patient awake ? ? ? ?Reviewed: ?Allergy & Precautions, NPO status , Patient's Chart, lab work & pertinent test results ? ?Airway ?Mallampati: II ? ?TM Distance: >3 FB ?Neck ROM: Full ? ? ? Dental ? ?(+) Dental Advisory Given ?  ?Pulmonary ?neg pulmonary ROS,  ?  ?breath sounds clear to auscultation ? ? ? ? ? ? Cardiovascular ?+ dysrhythmias  ?Rhythm:Regular Rate:Normal ? ? ?  ?Neuro/Psych ?negative neurological ROS ?   ? GI/Hepatic ?negative GI ROS, Neg liver ROS,   ?Endo/Other  ?negative endocrine ROS ? Renal/GU ?negative Renal ROS  ? ?  ?Musculoskeletal ? ?(+) Arthritis ,  ? Abdominal ?  ?Peds ? Hematology ?negative hematology ROS ?(+)   ?Anesthesia Other Findings ? ? Reproductive/Obstetrics ? ?  ? ? ? ? ? ? ? ? ? ? ? ? ? ?  ?  ? ? ? ? ? ? ? ? ?Lab Results  ?Component Value Date  ? WBC 7.2 03/02/2020  ? HGB 14.8 03/02/2020  ? HCT 44.1 03/02/2020  ? MCV 97.6 03/02/2020  ? PLT 225 03/02/2020  ? ?Lab Results  ?Component Value Date  ? CREATININE 1.17 03/02/2020  ? BUN 20 03/02/2020  ? NA 141 03/02/2020  ? K 4.5 03/02/2020  ? CL 103 03/02/2020  ? CO2 27 03/02/2020  ? ? ?Anesthesia Physical ?Anesthesia Plan ? ?ASA: 2 ? ?Anesthesia Plan: Spinal  ? ?Post-op Pain Management: Regional block* and Ofirmev IV (intra-op)*  ? ?Induction:  ? ?PONV Risk Score and Plan: 1 and Propofol infusion, Ondansetron and Treatment may vary due to age or medical condition ? ?Airway Management Planned: Natural Airway and Simple Face Mask ? ?Additional Equipment:  ? ?Intra-op Plan:  ? ?Post-operative Plan:  ? ?Informed Consent: I have reviewed the patients History and Physical, chart, labs and discussed the procedure including the risks, benefits and alternatives for the proposed anesthesia with the patient or authorized representative who has indicated his/her understanding and acceptance.  ? ? ? ? ? ?Plan Discussed with:  CRNA ? ?Anesthesia Plan Comments:   ? ? ? ? ? ? ?Anesthesia Quick Evaluation ? ?

## 2021-10-31 NOTE — Op Note (Signed)
OPERATIVE REPORT-TOTAL KNEE ARTHROPLASTY ? ? ?Pre-operative diagnosis- Osteoarthritis  Left knee(s) ? ?Post-operative diagnosis- Osteoarthritis Left knee(s) ? ?Procedure-  Left  Total Knee Arthroplasty ? ?Surgeon- Dione Plover. Cheron Pasquarelli, MD ? ?Assistant- Theresa Duty, PA-C  ? ?Anesthesia-   Adductor canal block and spinal ? ?EBL- 25 ml ? ?Drains None ? ?Tourniquet time-  ?Total Tourniquet Time Documented: ?Thigh (Left) - 40 minutes ?Total: Thigh (Left) - 40 minutes ?   ? ?Complications- None ? ?Condition-PACU - hemodynamically stable.  ? ?Brief Clinical Note   Shawn Bell is a 81 y.o. year old male with end stage OA of his left knee with progressively worsening pain and dysfunction. He has constant pain, with activity and at rest and significant functional deficits with difficulties even with ADLs. He has had extensive non-op management including analgesics, injections of cortisone, and home exercise program, but remains in significant pain with significant dysfunction. Radiographs show bone on bone arthritis medial and patellofemoral. He presents now for left Total Knee Arthroplasty.    ? ?Procedure in detail--- ? ? The patient is brought into the operating room and positioned supine on the operating table. After successful administration of  Adductor canal block and spinal,   a tourniquet is placed high on the  Left thigh(s) and the lower extremity is prepped and draped in the usual sterile fashion. Time out is performed by the operating team and then the  Left lower extremity is wrapped in Esmarch, knee flexed and the tourniquet inflated to 300 mmHg.  ?     A midline incision is made with a ten blade through the subcutaneous tissue to the level of the extensor mechanism. A fresh blade is used to make a medial parapatellar arthrotomy. Soft tissue over the proximal medial tibia is subperiosteally elevated to the joint line with a knife and into the semimembranosus bursa with a Cobb elevator. Soft tissue over  the proximal lateral tibia is elevated with attention being paid to avoiding the patellar tendon on the tibial tubercle. The patella is everted, knee flexed 90 degrees and the ACL and PCL are removed. Findings are bone on bone medial and patellofemoral with large global osteophytes   ?     The drill is used to create a starting hole in the distal femur and the canal is thoroughly irrigated with sterile saline to remove the fatty contents. The 5 degree Left  valgus alignment guide is placed into the femoral canal and the distal femoral cutting block is pinned to remove 9 mm off the distal femur. Resection is made with an oscillating saw. ?     The tibia is subluxed forward and the menisci are removed. The extramedullary alignment guide is placed referencing proximally at the medial aspect of the tibial tubercle and distally along the second metatarsal axis and tibial crest. The block is pinned to remove 59m off the more deficient medial  side. Resection is made with an oscillating saw. Size 8is the most appropriate size for the tibia and the proximal tibia is prepared with the modular drill and keel punch for that size. ?     The femoral sizing guide is placed and size 8 is most appropriate. Rotation is marked off the epicondylar axis and confirmed by creating a rectangular flexion gap at 90 degrees. The size 8 cutting block is pinned in this rotation and the anterior, posterior and chamfer cuts are made with the oscillating saw. The intercondylar block is then placed and that cut is made. ?  Trial size 8 tibial component, trial size 8 posterior stabilized femur and a 12  mm posterior stabilized rotating platform insert trial is placed. Full extension is achieved with excellent varus/valgus and anterior/posterior balance throughout full range of motion. The patella is everted and thickness measured to be 24  mm. Free hand resection is taken to 14 mm, a 38 template is placed, lug holes are drilled, trial patella is  placed, and it tracks normally. Osteophytes are removed off the posterior femur with the trial in place. All trials are removed and the cut bone surfaces prepared with pulsatile lavage. Cement is mixed and once ready for implantation, the size 8 tibial implant, size  8 posterior stabilized femoral component, and the size 38 patella are cemented in place and the patella is held with the clamp. The trial insert is placed and the knee held in full extension. The Exparel (20 ml mixed with 60 ml saline) is injected into the extensor mechanism, posterior capsule, medial and lateral gutters and subcutaneous tissues.  All extruded cement is removed and once the cement is hard the permanent 12 mm posterior stabilized rotating platform insert is placed into the tibial tray. ?     The wound is copiously irrigated with saline solution and the extensor mechanism closed with # 0 Stratofix suture. The tourniquet is released for a total tourniquet time of 40  minutes. Flexion against gravity is 140 degrees and the patella tracks normally. Subcutaneous tissue is closed with 2.0 vicryl and subcuticular with running 4.0 Monocryl. The incision is cleaned and dried and steri-strips and a bulky sterile dressing are applied. The limb is placed into a knee immobilizer and the patient is awakened and transported to recovery in stable condition. ?     Please note that a surgical assistant was a medical necessity for this procedure in order to perform it in a safe and expeditious manner. Surgical assistant was necessary to retract the ligaments and vital neurovascular structures to prevent injury to them and also necessary for proper positioning of the limb to allow for anatomic placement of the prosthesis. ? ? ?Dione Plover Linsi Humann, MD ? ? ? ?10/31/2021, 3:41 PM ? ? ?

## 2021-10-31 NOTE — Progress Notes (Signed)
Pt has declined use of CPAP QHS.  ?

## 2021-10-31 NOTE — Anesthesia Postprocedure Evaluation (Signed)
Anesthesia Post Note ? ?Patient: Shawn Bell ? ?Procedure(s) Performed: TOTAL KNEE ARTHROPLASTY (Left: Knee) ?CYSTOSCOPY FLEXIBLE (Urethra) ? ?  ? ?Patient location during evaluation: PACU ?Anesthesia Type: Spinal ?Level of consciousness: awake and alert ?Pain management: pain level controlled ?Vital Signs Assessment: post-procedure vital signs reviewed and stable ?Respiratory status: spontaneous breathing and respiratory function stable ?Cardiovascular status: blood pressure returned to baseline and stable ?Postop Assessment: spinal receding ?Anesthetic complications: no ? ? ?No notable events documented. ? ?Last Vitals:  ?Vitals:  ? 10/31/21 1715 10/31/21 1732  ?BP: 133/85 136/85  ?Pulse: 67 72  ?Resp: (!) 28 15  ?Temp: 36.4 ?C   ?SpO2: 100% 100%  ?  ?Last Pain:  ?Vitals:  ? 10/31/21 1715  ?TempSrc:   ?PainSc: 0-No pain  ? ? ?  ?  ?  ?  ?  ?  ? ?Suzette Battiest E ? ? ? ? ?

## 2021-10-31 NOTE — Discharge Instructions (Addendum)
? ?Gaynelle Arabian, MD ?Total Joint Specialist ?[facility] ?[address] ?[place], [place] [address] ?[phone] ? ?TOTAL KNEE REPLACEMENT POSTOPERATIVE DIRECTIONS ? ? ? ?Knee Rehabilitation, Guidelines Following Surgery  ?Results after knee surgery are often greatly improved when you follow the exercise, range of motion and muscle strengthening exercises prescribed by your doctor. Safety measures are also important to protect the knee from further injury. If any of these exercises cause you to have increased pain or swelling in your knee joint, decrease the amount until you are comfortable again and slowly increase them. If you have problems or questions, call your caregiver or physical therapist for advice.  ? ?BLOOD CLOT PREVENTION ?Take a 10 mg Xarelto once a day for three weeks following surgery. Then take an 81 mg Aspirin once a day for three weeks. Then discontinue Aspirin. ?You may resume your vitamins/supplements once you have discontinued the Xarelto. ?Do not take any NSAIDs (Advil, Aleve, Ibuprofen, Meloxicam, etc.) until you have discontinued the Xarelto.  ? ?HOME CARE INSTRUCTIONS  ?Remove items at home which could result in a fall. This includes throw rugs or furniture in walking pathways.  ?ICE to the affected knee as much as tolerated. Icing helps control swelling. If the swelling is well controlled you will be more comfortable and rehab easier. Continue to use ice on the knee for pain and swelling from surgery. You may notice swelling that will progress down to the foot and ankle. This is normal after surgery. Elevate the leg when you are not up walking on it.    ?Continue to use the breathing machine which will help keep your temperature down. It is common for your temperature to cycle up and down following surgery, especially at night when you are not up moving around and exerting yourself. The breathing machine keeps your lungs expanded and your temperature  down. ?Do not place pillow under the operative knee, focus on keeping the knee straight while resting ? ?DIET ?You may resume your previous home diet once you are discharged from the hospital. ? ?DRESSING / WOUND CARE / SHOWERING ?Keep your bulky bandage on for 2 days. On the third post-operative day you may remove the Ace bandage and gauze. There is a waterproof adhesive bandage on your skin which will stay in place until your first follow-up appointment. Once you remove this you will not need to place another bandage ?You may begin showering 3 days following surgery, but do not submerge the incision under water. ? ?ACTIVITY ?For the first 5 days, the key is rest and control of pain and swelling ?Do your home exercises twice a day starting on post-operative day 3. On the days you go to physical therapy, just do the home exercises once that day. ?You should rest, ice and elevate the leg for 50 minutes out of every hour. Get up and walk/stretch for 10 minutes per hour. After 5 days you can increase your activity slowly as tolerated. ?Walk with your walker as instructed. Use the walker until you are comfortable transitioning to a cane. Walk with the cane in the opposite hand of the operative leg. You may discontinue the cane once you are comfortable and walking steadily. ?Avoid periods of inactivity such as sitting longer than an hour when not asleep. This helps prevent blood clots.  ?You may discontinue the knee immobilizer once you are able to perform a straight leg raise while lying down. ?You may resume a sexual relationship in one month or  when given the OK by your doctor.  ?You may return to work once you are cleared by your doctor.  ?Do not drive a car for 6 weeks or until released by your surgeon.  ?Do not drive while taking narcotics. ? ?TED HOSE STOCKINGS ?Wear the elastic stockings on both legs for three weeks following surgery during the day. You may remove them at night for sleeping. ? ?WEIGHT  BEARING ?Weight bearing as tolerated with assist device (walker, cane, etc) as directed, use it as long as suggested by your surgeon or therapist, typically at least 4-6 weeks. ? ?POSTOPERATIVE CONSTIPATION PROTOCOL ?Constipation - defined medically as fewer than three stools per week and severe constipation as less than one stool per week. ? ?One of the most common issues patients have following surgery is constipation.  Even if you have a regular bowel pattern at home, your normal regimen is likely to be disrupted due to multiple reasons following surgery.  Combination of anesthesia, postoperative narcotics, change in appetite and fluid intake all can affect your bowels.  In order to avoid complications following surgery, here are some recommendations in order to help you during your recovery period. ? ?Colace (docusate) - Pick up an over-the-counter form of Colace or another stool softener and take twice a day as long as you are requiring postoperative pain medications.  Take with a full glass of water daily.  If you experience loose stools or diarrhea, hold the colace until you stool forms back up. If your symptoms do not get better within 1 week or if they get worse, check with your doctor. ?Dulcolax (bisacodyl) - Pick up over-the-counter and take as directed by the product packaging as needed to assist with the movement of your bowels.  Take with a full glass of water.  Use this product as needed if not relieved by Colace only.  ?MiraLax (polyethylene glycol) - Pick up over-the-counter to have on hand. MiraLax is a solution that will increase the amount of water in your bowels to assist with bowel movements.  Take as directed and can mix with a glass of water, juice, soda, coffee, or tea. Take if you go more than two days without a movement. Do not use MiraLax more than once per day. Call your doctor if you are still constipated or irregular after using this medication for 7 days in a row. ? ?If you continue  to have problems with postoperative constipation, please contact the office for further assistance and recommendations.  If you experience "the worst abdominal pain ever" or develop nausea or vomiting, please contact the office immediatly for further recommendations for treatment. ? ?ITCHING ?If you experience itching with your medications, try taking only a single pain pill, or even half a pain pill at a time.  You can also use Benadryl over the counter for itching or also to help with sleep.  ? ?MEDICATIONS ?See your medication summary on the ?After Visit Summary? that the nursing staff will review with you prior to discharge.  You may have some home medications which will be placed on hold until you complete the course of blood thinner medication.  It is important for you to complete the blood thinner medication as prescribed by your surgeon.  Continue your approved medications as instructed at time of discharge. ? ?PRECAUTIONS ?If you experience chest pain or shortness of breath - call 911 immediately for transfer to the hospital emergency department.  ?If you develop a fever greater that 101 F, purulent  drainage from wound, increased redness or drainage from wound, foul odor from the wound/dressing, or calf pain - CONTACT YOUR SURGEON.   ?                                                ?FOLLOW-UP APPOINTMENTS ?Make sure you keep all of your appointments after your operation with your surgeon and caregivers. You should call the office at the above phone number and make an appointment for approximately two weeks after the date of your surgery or on the date instructed by your surgeon outlined in the "After Visit Summary". ? ?RANGE OF MOTION AND STRENGTHENING EXERCISES  ?Rehabilitation of the knee is important following a knee injury or an operation. After just a few days of immobilization, the muscles of the thigh which control the knee become weakened and shrink (atrophy). Knee exercises are designed to build up  the tone and strength of the thigh muscles and to improve knee motion. Often times heat used for twenty to thirty minutes before working out will loosen up your tissues and help with improving the range of motion but do

## 2021-10-31 NOTE — Anesthesia Procedure Notes (Signed)
Procedure Name: [place] ?Date/Time: 10/31/2021 1:35 PM ?Performed by: Victoriano Lain, CRNA ?Pre-anesthesia Checklist: Patient identified, Emergency Drugs available, Suction available, Patient being monitored and Timeout performed ?Patient Re-evaluated:Patient Re-evaluated prior to induction ?Oxygen Delivery Method: Simple face mask ?Dental Injury: Teeth and Oropharynx as per pre-operative assessment  ? ? ? ? ?

## 2021-10-31 NOTE — Anesthesia Procedure Notes (Signed)
Anesthesia Regional Block: Adductor canal block  ? ?Pre-Anesthetic Checklist: , timeout performed,  Correct Patient, Correct Site, Correct Laterality,  Correct Procedure, Correct Position, site marked,  Risks and benefits discussed,  Surgical consent,  Pre-op evaluation,  At surgeon's request and post-op pain management ? ?Laterality: Left ? ?Prep: chloraprep     ?  ?Needles:  ?Injection technique: Single-shot ? ?Needle Type: Echogenic Needle   ? ? ?Needle Length: 9cm  ?Needle Gauge: 21  ? ? ? ?Additional Needles: ? ? ?Procedures:,,,, ultrasound used (permanent image in chart),,    ?Narrative:  ?Start time: 10/31/2021 1:07 PM ?End time: 10/31/2021 1:12 PM ?Injection made incrementally with aspirations every 5 mL. ? ?Performed by: Personally  ?Anesthesiologist: Suzette Battiest, MD ? ? ? ? ?

## 2021-10-31 NOTE — Progress Notes (Signed)
Orthopedic Tech Progress Note ?Patient Details:  ?Shawn Bell ?Nov 23, 1940 ?[id number] ? ?CPM Right Knee ?CPM Right Knee: On ?Right Knee Flexion (Degrees): 40 ?Right Knee Extension (Degrees): 10 ? ?Post Interventions ?Patient Tolerated: Well ?Instructions Provided: Care of device, Adjustment of device ? ?Shawn Bell ?10/31/2021, 4:10 PM ? ?

## 2021-10-31 NOTE — Transfer of Care (Signed)
Immediate Anesthesia Transfer of Care Note ? ?Patient: Shawn Bell ? ?Procedure(s) Performed: TOTAL KNEE ARTHROPLASTY (Left: Knee) ?CYSTOSCOPY FLEXIBLE (Urethra) ? ?Patient Location: PACU ? ?Anesthesia Type:Regional and Spinal ? ?Level of Consciousness: awake, alert  and oriented ? ?Airway & Oxygen Therapy: Patient Spontanous Breathing and Patient connected to face mask oxygen ? ?Post-op Assessment: Report given to RN and Post -op Vital signs reviewed and stable ? ?Post vital signs: Reviewed and stable ? ?Last Vitals:  ?Vitals Value Taken Time  ?BP    ?Temp    ?Pulse 78 10/31/21 1607  ?Resp 18 10/31/21 1607  ?SpO2 100 % 10/31/21 1607  ?Vitals shown include unvalidated device data. ? ?Last Pain:  ?Vitals:  ? 10/31/21 1310  ?TempSrc:   ?PainSc: 0-No pain  ?   ? ?Patients Stated Pain Goal: 4 (10/31/21 1135) ? ?Complications: No notable events documented. ?

## 2021-10-31 NOTE — Interval H&P Note (Signed)
History and Physical Interval Note: ? ?10/31/2021 ?11:30 AM ? ?Shawn Bell  has presented today for surgery, with the diagnosis of Left knee osteoarthritis.  The various methods of treatment have been discussed with the patient and family. After consideration of risks, benefits and other options for treatment, the patient has consented to  Procedure(s): ?TOTAL KNEE ARTHROPLASTY (Left) as a surgical intervention.  The patient's history has been reviewed, patient examined, no change in status, stable for surgery.  I have reviewed the patient's chart and labs.  Questions were answered to the patient's satisfaction.   ? ? ?Shawn Bell ? ? ?

## 2021-10-31 NOTE — Anesthesia Procedure Notes (Addendum)
Spinal ? ?Patient location during procedure: OR ?Start time: 10/31/2021 1:37 PM ?End time: 10/31/2021 1:42 PM ?Reason for block: surgical anesthesia ?Staffing ?Performed: anesthesiologist  ?Anesthesiologist: Suzette Battiest, MD ?Preanesthetic Checklist ?Completed: patient identified, IV checked, site marked, risks and benefits discussed, surgical consent, monitors and equipment checked, pre-op evaluation and timeout performed ?Spinal Block ?Patient position: sitting ?Prep: DuraPrep ?Patient monitoring: heart rate, cardiac monitor, continuous pulse ox and blood pressure ?Approach: midline ?Location: L4-5 ?Injection technique: single-shot ?Needle ?Needle type: Pencan  ?Needle gauge: 24 G ?Needle length: 9 cm ?Assessment ?Sensory level: T4 ?Events: CSF return ? ? ? ?

## 2021-10-31 NOTE — Consult Note (Signed)
Urology Intra Op Consult  ? ?Reason for consult: difficult foley placement ? ?History of Present Illness: Shawn Bell is a 81 y.o. who was consulted for the above. OR staff attempted catheter placement peri-operatively and were unsuccessful after several attempts ? ?Of note, Shawn Bell has a history of prostate radiation.  ? ?Past Medical History:  ?Diagnosis Date  ? A-fib ([place])   ? Episode x1  ? Arthritis   ? shoulders  ? Diverticulosis of colon (without mention of hemorrhage)   ? Hemorrhoids   ? History of prostate cancer   ? S/P RADIATION [place]  ? History of radiation therapy   ? Hyperlipemia   ? Incomplete RBBB 12/2019  ? Intermittent self-catheterization of bladder   ? ongoing; has been self catherizing for 5 years without any issues   ? OA (osteoarthritis) of knee 10/31/2021  ? Personal history of colonic polyps 01/23/2003  ? pt denies  ? Prostate cancer ([place])   ? ? ?Past Surgical History:  ?Procedure Laterality Date  ? CATARACT EXTRACTION W/ INTRAOCULAR LENS  IMPLANT, BILATERAL    ? CHOLECYSTECTOMY  02/07/2012  ? Procedure: LAPAROSCOPIC CHOLECYSTECTOMY WITH INTRAOPERATIVE CHOLANGIOGRAM;  Surgeon: Earnstine Regal, MD;  Location: [facility];  Service: General;  Laterality: N/A;  ? COLONOSCOPY    ? INGUINAL HERNIA REPAIR Right 05/07/2018  ? Procedure: OPEN RIGHT INGUINAL HERNIA REPAIR WITH INSERTION OF MESH;  Surgeon: Armandina Gemma, MD;  Location: [facility];  Service: General;  Laterality: Right;  ? INGUINAL HERNIA REPAIR Left 03/12/2020  ? Procedure: OPEN LEFT INGUINAL HERNIA REPAIR WITH MESH;  Surgeon: Armandina Gemma, MD;  Location: [facility];  Service: General;  Laterality: Left;  ? JOINT REPLACEMENT    ? REVERSE SHOULDER ARTHROPLASTY Left 05/03/2017  ? Procedure: REVERSE LEFT SHOULDER ARTHROPLASTY;  Surgeon: Justice Britain, MD;  Location: [place];  Service: Orthopedics;  Laterality: Left;  ? REVERSE SHOULDER ARTHROPLASTY Right 09/27/2017  ? Procedure: RIGHT REVERSE SHOULDER ARTHROPLASTY;  Surgeon: Justice Britain, MD;  Location: [place];  Service: Orthopedics;  Laterality: Right;  ? SHOULDER ARTHROSCOPY    ? BILATERAL  ? TOTAL HIP ARTHROPLASTY    ? right  ? TOTAL HIP ARTHROPLASTY  03/06/2012  ? Procedure: TOTAL HIP ARTHROPLASTY;  Surgeon: Gearlean Alf, MD;  Location: [facility];  Service: Orthopedics;  Laterality: Left;  ? ? ?Current Hospital Medications: ? ?Home Meds:  ?No current facility-administered medications on file prior to encounter.  ? ?Current Outpatient Medications on File Prior to Encounter  ?Medication Sig Dispense Refill  ? Apoaequorin (PREVAGEN PO) Take 1 tablet by mouth daily.    ? Bacillus Coagulans-Inulin (PROBIOTIC-PREBIOTIC PO) Take 2 capsules by mouth daily.    ? Cholecalciferol (VITAMIN D3 GUMMIES PO) Take 1 tablet by mouth daily.    ? Coenzyme Q10 (COQ10) 200 MG CAPS Take 200 mg by mouth every evening.     ? ELDERBERRY PO Take 2 tablets by mouth daily. Gummy    ? Magnesium Oxide 250 MG TABS Take 250 mg by mouth daily.    ? Menthol, Topical Analgesic, (BIOFREEZE) 10 % CREA Apply 1 application. topically daily as needed (knee pain).    ? Multiple Vitamin (MULTIVITAMIN WITH MINERALS) TABS tablet Take 1 tablet by mouth daily. Centrum Silver    ? rosuvastatin (CRESTOR) 5 MG tablet Take 1 tablet (5 mg total) by mouth daily. Needs office visit and labs-2nd notice (Patient taking differently: Take 5 mg by mouth every evening.) 30 tablet 0  ? Zinc 50  MG TABS Take 50 mg by mouth daily.    ? zolpidem (AMBIEN) 5 MG tablet Take 5 mg by mouth at bedtime.    ? aspirin EC 81 MG tablet Take 81 mg by mouth at bedtime. Swallow whole.    ? ? ? ?Scheduled Meds: ? bupivacaine liposome  20 mL Other Once  ? fentaNYL (SUBLIMAZE) injection  50-100 mcg Intravenous UD  ? ?Continuous Infusions: ? lactated ringers    ? lactated ringers 10 mL/hr at 10/31/21 1333  ? ?PRN Meds:.bupivacaine liposome, sodium chloride (PF), sodium chloride irrigation, sterile water for irrigation ? ?Allergies: No Known Allergies ? ?Family History  ?Problem Relation Age of  Onset  ? Heart failure Mother   ? Rheumatic fever Father   ? Colon cancer Neg Hx   ? Colon polyps Neg Hx   ? Rectal cancer Neg Hx   ? Stomach cancer Neg Hx   ? ? ?Social History:  reports that he has never smoked. He has never used smokeless tobacco. He reports current alcohol use. He reports that he does not use drugs. ? ?ROS: ?A complete review of systems was performed.  All systems are negative except for pertinent findings as noted. ? ?Physical Exam:  ?Vital signs in last 24 hours: ?Temp:  [97.6 ?F (36.4 ?C)] 97.6 ?F (36.4 ?C) (05/15 1132) ?Pulse Rate:  [55-83] 69 (05/15 1315) ?Resp:  [16-20] 20 (05/15 1315) ?BP: (125-157)/(83-90) 150/90 (05/15 1315) ?SpO2:  [95 %-98 %] 98 % (05/15 1315) ?Weight:  [93 kg] 93 kg (05/15 1135) ?Gen: patient under anesthesia at time of exam ?Cardiovascular: Regular rate and rhythm ?Respiratory: Normal respiratory effort, Lungs clear bilaterally ?GI: Abdomen is soft, nontender, nondistended, no abdominal masses ?Neurologic: Grossly intact, no focal deficits ?Psychiatric: Normal mood and affect ? ?Laboratory Data:  ?No results for input(s): WBC, HGB, HCT, PLT in the last 72 hours. ? ?No results for input(s): NA, K, CL, GLUCOSE, BUN, CALCIUM, CREATININE in the last 72 hours. ? ?Invalid input(s): CO3 ? ? ?No results found for this or any previous visit (from the past 24 hour(s)). ?No results found for this or any previous visit (from the past 240 hour(s)). ? ?Renal Function: ?No results for input(s): CREATININE in the last 168 hours. ?CrCl cannot be calculated (Patient's most recent lab result is older than the maximum 21 days allowed.). ? ?Procedure ?The patient was Prepped and draped in the usual sterile fashion.  I carefully inserted a 56 French coud? but met resistance and was unable to pass.  We then carefully inserted a cystoscope.  I encountered a stricture likely around the membranous urethra.  The tissue here appeared consistent with the patient's history of radiation.  It was  likely around it 10-12 French stricture.  I inserted a guidewire into the bladder and withdrew the cystoscope.  I then used [place] sounds to serially dilate the urethra from 12 [place] to 22 [place].  I then was able to insert a catheter over the wire.  The balloon was inflated with 10 cc and was connected to the appropriate collection bag.  There was return of clear yellow urine. ? ?Impression/Recommendation ?81 yo M s/p urethral dilation for difficult foley, urethral stricture ?-recommend foley stay in place for 1 week, at which time the patient should f/u with urology ? ?Donald Pore MD ?10/31/2021, 2:29 PM  ?[facility]  ?Pager: [phone] ? ? ? ?

## 2021-10-31 NOTE — OR Nursing (Signed)
Attempted to insert Foley catheter 16 fr with sterile technique.  Met with resistance, and stopped instructed by K Edmiston PA-C to hold foley and get a urology consult.   ?

## 2021-10-31 NOTE — Plan of Care (Signed)
  Problem: Nutrition: Goal: Adequate nutrition will be maintained Outcome: Progressing   Problem: Coping: Goal: Level of anxiety will decrease Outcome: Progressing   Problem: Pain Managment: Goal: General experience of comfort will improve Outcome: Progressing   

## 2021-11-01 ENCOUNTER — Encounter (HOSPITAL_COMMUNITY): Payer: Self-pay | Admitting: Orthopedic Surgery

## 2021-11-01 DIAGNOSIS — Z96611 Presence of right artificial shoulder joint: Secondary | ICD-10-CM | POA: Diagnosis not present

## 2021-11-01 DIAGNOSIS — Z79899 Other long term (current) drug therapy: Secondary | ICD-10-CM | POA: Diagnosis not present

## 2021-11-01 DIAGNOSIS — Z96643 Presence of artificial hip joint, bilateral: Secondary | ICD-10-CM | POA: Diagnosis not present

## 2021-11-01 DIAGNOSIS — M1712 Unilateral primary osteoarthritis, left knee: Secondary | ICD-10-CM | POA: Diagnosis not present

## 2021-11-01 DIAGNOSIS — Z8546 Personal history of malignant neoplasm of prostate: Secondary | ICD-10-CM | POA: Diagnosis not present

## 2021-11-01 DIAGNOSIS — Z96612 Presence of left artificial shoulder joint: Secondary | ICD-10-CM | POA: Diagnosis not present

## 2021-11-01 DIAGNOSIS — Z7982 Long term (current) use of aspirin: Secondary | ICD-10-CM | POA: Diagnosis not present

## 2021-11-01 LAB — CBC
HCT: 35.6 % — ABNORMAL LOW (ref 39.0–52.0)
Hemoglobin: 12 g/dL — ABNORMAL LOW (ref 13.0–17.0)
MCH: 32.8 pg (ref 26.0–34.0)
MCHC: 33.7 g/dL (ref 30.0–36.0)
MCV: 97.3 fL (ref 80.0–100.0)
Platelets: 186 10*3/uL (ref 150–400)
RBC: 3.66 MIL/uL — ABNORMAL LOW (ref 4.22–5.81)
RDW: 12.4 % (ref 11.5–15.5)
WBC: 12.9 10*3/uL — ABNORMAL HIGH (ref 4.0–10.5)
nRBC: 0 % (ref 0.0–0.2)

## 2021-11-01 LAB — BASIC METABOLIC PANEL
Anion gap: 8 (ref 5–15)
BUN: 14 mg/dL (ref 8–23)
CO2: 22 mmol/L (ref 22–32)
Calcium: 8 mg/dL — ABNORMAL LOW (ref 8.9–10.3)
Chloride: 105 mmol/L (ref 98–111)
Creatinine, Ser: 1 mg/dL (ref 0.61–1.24)
GFR, Estimated: >60 mL/min (ref >60)
Glucose, Bld: 138 mg/dL — ABNORMAL HIGH (ref 70–99)
Potassium: 4.2 mmol/L (ref 3.5–5.1)
Sodium: 135 mmol/L (ref 135–145)

## 2021-11-01 MED ORDER — METHOCARBAMOL 500 MG PO TABS: 500.0000 mg | ORAL_TABLET | Freq: Four times a day (QID) | ORAL | 0 refills | Status: DC | PRN

## 2021-11-01 MED ORDER — OXYCODONE HCL 5 MG PO TABS
5.0000 mg | ORAL_TABLET | Freq: Four times a day (QID) | ORAL | 0 refills | Status: DC | PRN
Start: 2021-11-01 — End: 2021-11-01

## 2021-11-01 MED ORDER — METHOCARBAMOL 500 MG PO TABS
500.0000 mg | ORAL_TABLET | Freq: Four times a day (QID) | ORAL | 0 refills | Status: DC | PRN
Start: 2021-11-01 — End: 2022-02-14

## 2021-11-01 MED ORDER — OXYCODONE HCL 5 MG PO TABS: 5.0000 mg | ORAL_TABLET | Freq: Four times a day (QID) | ORAL | 0 refills | Status: DC | PRN

## 2021-11-01 MED ORDER — TRAMADOL HCL 50 MG PO TABS: 50.0000 mg | ORAL_TABLET | Freq: Four times a day (QID) | ORAL | 0 refills | Status: DC | PRN

## 2021-11-01 MED ORDER — RIVAROXABAN 10 MG PO TABS
10.0000 mg | ORAL_TABLET | Freq: Every day | ORAL | 0 refills | Status: DC
Start: 2021-11-01 — End: 2021-11-01

## 2021-11-01 MED ORDER — RIVAROXABAN 10 MG PO TABS: 10.0000 mg | ORAL_TABLET | Freq: Every day | ORAL | 0 refills | Status: DC

## 2021-11-01 NOTE — Evaluation (Signed)
Physical Therapy Evaluation ?Patient Details ?Name: Shawn Bell ?MRN: [id number] ?DOB: [birth year] ?Today's Date: 11/01/2021 ? ?History of Present Illness ? s/p L TKA and cystoscopy on 11/01/21. PMH:  bil TSA. bil THA, ataxia, prostate CA  ?Clinical Impression ? Patient evaluated by Physical Therapy with no further acute PT needs identified. All education has been completed and the patient has no further questions.  ?Reviewed areas as noted below, initiated TKA HEP; emphasis and education os use of RW not rollator and overall safety awareness. Reviewed terminal knee extension and importance of such.  Pt is ready to d.c with assist from family as needed from PT standpoint. ? See below for any follow-up Physical Therapy or equipment needs. PT is signing off. Thank you for this referral. ?   ?   ? ?Recommendations for follow up therapy are one component of a multi-disciplinary discharge planning process, led by the attending physician.  Recommendations may be updated based on patient status, additional functional criteria and insurance authorization. ? ?Follow Up Recommendations Follow physician's recommendations for discharge plan and follow up therapies ? ?  ?Assistance Recommended at Discharge    ?Patient can return home with the following ? Assist for transportation;Help with stairs or ramp for entrance;A little help with bathing/dressing/bathroom;A little help with walking and/or transfers;Assistance with cooking/housework ? ?  ?Equipment Recommendations None recommended by PT  ?Recommendations for Other Services ?    ?  ?Functional Status Assessment Patient has had a recent decline in their functional status and demonstrates the ability to make significant improvements in function in a reasonable and predictable amount of time.  ? ?  ?Precautions / Restrictions Precautions ?Precautions: Fall;Knee ?Required Braces or Orthoses: Knee Immobilizer - Left ?Knee Immobilizer - Left: Discontinue once straight leg raise with  < 10 degree lag ?Restrictions ?Weight Bearing Restrictions: No ?Other Position/Activity Restrictions: WBAT  ? ?  ? ?Mobility ? Bed Mobility ?Overal bed mobility: Modified Independent ?  ?  ?  ?  ?  ?  ?General bed mobility comments: no physical assist ?  ? ?Transfers ?Overall transfer level: Needs assistance ?Equipment used: Rolling walker (2 wheels) ?Transfers: Sit to/from Stand ?Sit to Stand: Supervision ?  ?  ?  ?  ?  ?General transfer comment: cues for hand placement ?  ? ?Ambulation/Gait ?Ambulation/Gait assistance: Supervision, Min guard ?Gait Distance (Feet): 140 Feet ?Assistive device: Rolling walker (2 wheels) ?Gait Pattern/deviations: Step-to pattern, Step-through pattern, Decreased stance time - left ?  ?  ?  ?General Gait Details: cues for RW position and safety. beginning step through gait pattern. cautioned pt not to use rollator until OPPT advises rollator or cane. pt demonstrates carryover of techniques during session ? ?Stairs ?  ?  ?  ?  ?  ? ?Wheelchair Mobility ?  ? ?Modified Rankin (Stroke Patients Only) ?  ? ?  ? ?Balance Overall balance assessment: Needs assistance ?Sitting-balance support: No upper extremity supported, Feet supported ?Sitting balance-Leahy Scale: Good ?  ?  ?Standing balance support: During functional activity, Reliant on assistive device for balance, No upper extremity supported ?Standing balance-Leahy Scale: Fair ?  ?  ?  ?  ?  ?  ?  ?  ?  ?  ?  ?  ?   ? ? ? ?Pertinent Vitals/Pain Pain Assessment ?Pain Assessment: 0-10 ?Pain Score: 5  ?Pain Location: L knee ?Pain Descriptors / Indicators: Aching, Sore ?Pain Intervention(s): Premedicated before session, Monitored during session, Limited activity within patient's tolerance, Repositioned  ? ? ?  Home Living Family/patient expects to be discharged to:: Private residence ?Living Arrangements: Spouse/significant other ?Available Help at Discharge: Family ?Type of Home: House ?Home Access: Stairs to enter ?  ?Entrance Stairs-Number  of Steps: 2 ?Alternate Level Stairs-Number of Steps: elevator ?Home Layout: Multi-level ?Home Equipment: Conservation officer, nature (2 wheels);Rollator (4 wheels) ?   ?  ?Prior Function Prior Level of Function : Independent/Modified Independent ?  ?  ?  ?  ?  ?  ?Mobility Comments: pt still works full time, owns multiple companies ?  ?  ? ? ?Hand Dominance  ?   ? ?  ?Extremity/Trunk Assessment  ? Upper Extremity Assessment ?Upper Extremity Assessment: Overall WFL for tasks assessed ?  ? ?Lower Extremity Assessment ?Lower Extremity Assessment: LLE deficits/detail ?LLE Deficits / Details: ankle WFL, knee and hip grossly 3/5. knee AROM ~8 to 60 degrees flexion ?  ? ?   ?Communication  ?    ?Cognition Arousal/Alertness: Awake/alert ?Behavior During Therapy: [facility] for tasks assessed/performed ?Overall Cognitive Status: Within Functional Limits for tasks assessed ?  ?  ?  ?  ?  ?  ?  ?  ?  ?  ?  ?  ?  ?  ?  ?  ?  ?  ?  ? ?  ?General Comments   ? ?  ?Exercises Total Joint Exercises ?Ankle Circles/Pumps: AROM, 10 reps, Both ?Quad Sets: AROM, Both, 10 reps ?Heel Slides: AAROM, AROM, Left, 10 reps ?Straight Leg Raises: AROM, Left, 10 reps  ? ?Assessment/Plan  ?  ?PT Assessment All further PT needs can be met in the next venue of care  ?PT Problem List   ? ?   ?  ?PT Treatment Interventions     ? ?PT Goals (Current goals can be found in the Care Plan section)  ?Acute Rehab PT Goals ?Patient Stated Goal: to go home! ?PT Goal Formulation: All assessment and education complete, DC therapy ? ?  ?Frequency   ?  ? ? ?Co-evaluation   ?  ?  ?  ?  ? ? ?  ?AM-PAC PT "6 Clicks" Mobility  ?Outcome Measure Help needed turning from your back to your side while in a flat bed without using bedrails?: None ?Help needed moving from lying on your back to sitting on the side of a flat bed without using bedrails?: None ?Help needed moving to and from a bed to a chair (including a wheelchair)?: None ?Help needed standing up from a chair using your arms (e.g.,  wheelchair or bedside chair)?: A Little ?Help needed to walk in hospital room?: A Little ?Help needed climbing 3-5 steps with a railing? : A Little ?6 Click Score: 21 ? ?  ?End of Session Equipment Utilized During Treatment: Gait belt ?Activity Tolerance: Patient tolerated treatment well ?Patient left: in chair;with call bell/phone within reach;with chair alarm set ?Nurse Communication: Mobility status ?PT Visit Diagnosis: Difficulty in walking, not elsewhere classified (R26.2) ?  ? ?Time: 5102-5852 ?PT Time Calculation (min) (ACUTE ONLY): 24 min ? ? ?Charges:   PT Evaluation ?$PT Eval Low Complexity: 1 Low ?PT Treatments ?$Gait Training: 8-22 mins ?  ?   ? ? ?Baxter Flattery, PT ? ?Acute Rehab Dept [facility]) [phone] ?Pager [phone] ? ?11/01/2021 ? ? ?Kell Ferris ?11/01/2021, 11:40 AM ? ?

## 2021-11-01 NOTE — Progress Notes (Signed)
Patient wants foley out prior to d/c. States that he does self catheterization and does not need the foley. Explained to patient that urology had recommended leaving in place d/t difficulty of placement of foley and that the patient may have some difficulty with self caths. Patient states he will not have any issues and that he will remove the foley on his own if RN does not. Foley removed, patient verbalized understanding to return to ER or call urologist if experiencing any difficulty r/t self catheterization.  ?

## 2021-11-01 NOTE — Progress Notes (Signed)
Patient discharged to home w/ family. Given all belongings, instructions, equipment. Verbalized understanding of all instructions. Escorted to pov via w/c. 

## 2021-11-01 NOTE — TOC Transition Note (Signed)
Transition of Care (TOC) - CM/SW Discharge Note ? ? ?Patient Details  ?Name: Shawn Bell ?MRN: [id number] ?Date of Birth: 1941-06-14 ? ?Transition of Care (TOC) CM/SW Contact:  ?Nateisha Moyd, LCSW ?Phone Number: ?11/01/2021, 10:38 AM ? ? ?Clinical Narrative:    ?Met with pt who reports need for rolling walker/ no agency preference and referral placed with [employer] for delivery to room.  Pt notes OPPT already arranged with [facility].  No further TOC needs. ? ? ?Final next level of care: OP Rehab ?Barriers to Discharge: No Barriers Identified ? ? ?Patient Goals and CMS Choice ?Patient states their goals for this hospitalization and ongoing recovery are:: return home ?  ?  ? ?Discharge Placement ?  ?           ?  ?  ?  ?  ? ?Discharge Plan and Services ?  ?  ?           ?DME Arranged: Walker rolling ?DME Agency: [employer] ?Date DME Agency Contacted: 11/01/21 ?Time DME Agency Contacted: [phone] ?Representative spoke with at DME Agency: Wells Guiles ?  ?  ?  ?  ?  ? ?Social Determinants of Health (SDOH) Interventions ?  ? ? ?Readmission Risk Interventions ?   ? View : No data to display.  ?  ?  ?  ? ? ? ? ? ?

## 2021-11-01 NOTE — Progress Notes (Signed)
? ?Subjective: ?1 Day Post-Op Procedure(s) (LRB): ?TOTAL KNEE ARTHROPLASTY (Left) ?CYSTOSCOPY FLEXIBLE ?Patient reports pain as mild.   ?Patient seen in rounds by Dr. Wynelle Link. ?Patient is well, and has had no acute complaints or problems No issues overnight. Denies chest pain, SOB, or calf pain.  ?We will begin therapy today. ? ?Objective: ?Vital signs in last 24 hours: ?Temp:  [97.5 ?F (36.4 ?C)-98.3 ?F (36.8 ?C)] 98.3 ?F (36.8 ?C) (05/16 0435) ?Pulse Rate:  [55-85] 73 (05/16 0435) ?Resp:  [15-28] 16 (05/16 0435) ?BP: (106-157)/(55-98) 106/77 (05/16 0435) ?SpO2:  [95 %-100 %] 96 % (05/16 0435) ?Weight:  [93 kg] 93 kg (05/15 1135) ? ?Intake/Output from previous day: ? ?Intake/Output Summary (Last 24 hours) at 11/01/2021 0831 ?Last data filed at 11/01/2021 0600 ?Gross per 24 hour  ?Intake 3147.01 ml  ?Output 3025 ml  ?Net 122.01 ml  ?  ? ?Intake/Output this shift: ?No intake/output data recorded. ? ?Labs: ?Recent Labs  ?  11/01/21 ?0327  ?HGB 12.0*  ? ?Recent Labs  ?  11/01/21 ?0327  ?WBC 12.9*  ?RBC 3.66*  ?HCT 35.6*  ?PLT 186  ? ?Recent Labs  ?  11/01/21 ?0327  ?NA 135  ?K 4.2  ?CL 105  ?CO2 22  ?BUN 14  ?CREATININE 1.00  ?GLUCOSE 138*  ?CALCIUM 8.0*  ? ?No results for input(s): LABPT, INR in the last 72 hours. ? ?Exam: ?General - Patient is Alert and Oriented ?Extremity - Neurologically intact ?Neurovascular intact ?Sensation intact distally ?Dorsiflexion/Plantar flexion intact ?Dressing - dressing C/D/I ?Motor Function - intact, moving foot and toes well on exam.  ? ?Past Medical History:  ?Diagnosis Date  ? A-fib ([place])   ? Episode x1  ? Arthritis   ? shoulders  ? Diverticulosis of colon (without mention of hemorrhage)   ? Hemorrhoids   ? History of prostate cancer   ? S/P RADIATION [place]  ? History of radiation therapy   ? Hyperlipemia   ? Incomplete RBBB 12/2019  ? Intermittent self-catheterization of bladder   ? ongoing; has been self catherizing for 5 years without any issues   ? OA (osteoarthritis) of knee  10/31/2021  ? Personal history of colonic polyps 01/23/2003  ? pt denies  ? Prostate cancer ([place])   ? ? ?Assessment/Plan: ?1 Day Post-Op Procedure(s) (LRB): ?TOTAL KNEE ARTHROPLASTY (Left) ?CYSTOSCOPY FLEXIBLE ?Principal Problem: ?  OA (osteoarthritis) of knee ?Active Problems: ?  Primary osteoarthritis of right knee ? ?Estimated body mass index is 27.05 kg/m? as calculated from the following: ?  Height as of this encounter: '6\' 1"'$  (1.854 m). ?  Weight as of this encounter: 93 kg. ?Advance diet ?Up with therapy ?D/C IV fluids ? ? ?Patient's anticipated LOS is less than 2 midnights, meeting these requirements: ?- Lives within 1 hour of care ?- Has a competent adult at home to recover with post-op recover ?- NO history of ? - Chronic pain requiring opioids ? - Diabetes ? - Coronary Artery Disease ? - Heart failure ? - Heart attack ? - Stroke ? - DVT/VTE ? - Respiratory Failure/COPD ? - Renal failure ? - Anemia ? - Advanced Liver disease  ? ?DVT Prophylaxis - Xarelto ?Weight bearing as tolerated. ?Begin therapy. ? ?Had to have foley catheter placed by urology postop yesterday after failed attempt by circulator nurse. Plan is for foley to stay in place x 1 week with urology follow-up at that time. ? ?Plan is to go Home after hospital stay. ?Plan for discharge later today  if progresses with therapy and meeting goals. ?Scheduled for OPPT at [facility]. ?Follow-up in the office in 2 weeks. ? ?The PDMP database was reviewed today prior to any opioid medications being prescribed to this patient. ? ?Theresa Duty, PA-C ?Orthopedic Surgery ?[phone] ?11/01/2021, 8:31 AM ? ?

## 2021-11-02 NOTE — Discharge Summary (Signed)
Patient ID: ?Shawn Bell ?MRN: [id number] ?DOB/AGE: 23-Feb-1941 81 y.o. ? ?Admit date: 10/31/2021 ?Discharge date: 11/01/2021 ? ?Admission Diagnoses:  ?Principal Problem: ?  OA (osteoarthritis) of knee ?Active Problems: ?  Primary osteoarthritis of right knee ? ? ?Discharge Diagnoses:  ?Same ? ?Past Medical History:  ?Diagnosis Date  ? A-fib ([place])   ? Episode x1  ? Arthritis   ? shoulders  ? Diverticulosis of colon (without mention of hemorrhage)   ? Hemorrhoids   ? History of prostate cancer   ? S/P RADIATION [place]  ? History of radiation therapy   ? Hyperlipemia   ? Incomplete RBBB 12/2019  ? Intermittent self-catheterization of bladder   ? ongoing; has been self catherizing for 5 years without any issues   ? OA (osteoarthritis) of knee 10/31/2021  ? Personal history of colonic polyps 01/23/2003  ? pt denies  ? Prostate cancer ([place])   ? ? ?Surgeries: Procedure(s): ?TOTAL KNEE ARTHROPLASTY ?CYSTOSCOPY FLEXIBLE on 10/31/2021 ?  ?Consultants:  ? ?Discharged Condition: Improved ? ?Hospital Course: Shawn Bell is an 81 y.o. male who was admitted 10/31/2021 for operative treatment ofOA (osteoarthritis) of knee. Patient has severe unremitting pain that affects sleep, daily activities, and work/hobbies. After pre-op clearance the patient was taken to the operating room on 10/31/2021 and underwent  Procedure(s): ?TOTAL KNEE ARTHROPLASTY ?CYSTOSCOPY FLEXIBLE.   ? ?Patient was given perioperative antibiotics:  ?Anti-infectives (From admission, onward)  ? ? Start     Dose/Rate Route Frequency Ordered Stop  ? 10/31/21 1945  ceFAZolin (ANCEF) IVPB 2g/100 mL premix       ? 2 g ?200 mL/hr over 30 Minutes Intravenous Every 6 hours 10/31/21 1737 11/01/21 0148  ? 10/31/21 1130  ceFAZolin (ANCEF) IVPB 2g/100 mL premix       ? 2 g ?200 mL/hr over 30 Minutes Intravenous On call to O.R. 10/31/21 1122 10/31/21 1413  ? ?  ?  ? ?Patient was given sequential compression devices, early ambulation, and chemoprophylaxis to prevent DVT. ? ?Patient  benefited maximally from hospital stay and there were no complications.   ? ?Recent vital signs: No data found.  ? ?Recent laboratory studies:  ?Recent Labs  ?  11/01/21 ?0327  ?WBC 12.9*  ?HGB 12.0*  ?HCT 35.6*  ?PLT 186  ?NA 135  ?K 4.2  ?CL 105  ?CO2 22  ?BUN 14  ?CREATININE 1.00  ?GLUCOSE 138*  ?CALCIUM 8.0*  ? ? ? ?Discharge Medications:   ?Allergies as of 11/01/2021   ?No Known Allergies ?  ? ?  ?Medication List  ?  ? ?STOP taking these medications   ? ?aspirin EC 81 MG tablet ?  ?Biofreeze 10 % Crea ?Generic drug: Menthol (Topical Analgesic) ?  ?CoQ10 200 MG Caps ?  ?ELDERBERRY PO ?  ?Magnesium Oxide 250 MG Tabs ?  ?multivitamin with minerals Tabs tablet ?  ?PREVAGEN PO ?  ?VITAMIN D3 GUMMIES PO ?  ?Zinc 50 MG Tabs ?  ?zolpidem 5 MG tablet ?Commonly known as: AMBIEN ?  ? ?  ? ?TAKE these medications   ? ?methocarbamol 500 MG tablet ?Commonly known as: ROBAXIN ?Take 1 tablet (500 mg total) by mouth every 6 (six) hours as needed for muscle spasms. ?  ?oxyCODONE 5 MG immediate release tablet ?Commonly known as: Oxy IR/ROXICODONE ?Take 1-2 tablets (5-10 mg total) by mouth every 6 (six) hours as needed for severe pain. ?  ?PROBIOTIC-PREBIOTIC PO ?Take 2 capsules by mouth daily. ?  ?rivaroxaban 10 MG Tabs  tablet ?Commonly known as: XARELTO ?Take 1 tablet (10 mg total) by mouth daily with breakfast for 20 days. Then resume one 81 mg aspirin once a day. ?  ?rosuvastatin 5 MG tablet ?Commonly known as: Crestor ?Take 1 tablet (5 mg total) by mouth daily. Needs office visit and labs-2nd notice ?What changed:  ?when to take this ?additional instructions ?  ?traMADol 50 MG tablet ?Commonly known as: ULTRAM ?Take 1-2 tablets (50-100 mg total) by mouth every 6 (six) hours as needed for moderate pain. ?  ? ?  ? ?  ?  ? ? ?  ?Discharge Care Instructions  ?(From admission, onward)  ?  ? ? ?  ? ?  Start     Ordered  ? 11/01/21 0000  Weight bearing as tolerated       ? 11/01/21 0835  ? 11/01/21 0000  Change dressing        ?Comments: You may remove the bulky bandage (ACE wrap and gauze) two days after surgery. You will have an adhesive waterproof bandage underneath. Leave this in place until your first follow-up appointment.  ? 11/01/21 0835  ? ?  ?  ? ?  ? ? ?Diagnostic Studies: No results found. ? ?Disposition: Discharge disposition: 01-Home or Self Care ? ? ? ? ? ? ?Discharge Instructions   ? ? Call MD / Call 911   Complete by: As directed ?  ? If you experience chest pain or shortness of breath, CALL 911 and be transported to the hospital emergency room.  If you develope a fever above 101 F, pus (white drainage) or increased drainage or redness at the wound, or calf pain, call your surgeon's office.  ? Change dressing   Complete by: As directed ?  ? You may remove the bulky bandage (ACE wrap and gauze) two days after surgery. You will have an adhesive waterproof bandage underneath. Leave this in place until your first follow-up appointment.  ? Constipation Prevention   Complete by: As directed ?  ? Drink plenty of fluids.  Prune juice may be helpful.  You may use a stool softener, such as Colace (over the counter) 100 mg twice a day.  Use MiraLax (over the counter) for constipation as needed.  ? Diet - low sodium heart healthy   Complete by: As directed ?  ? Do not put a pillow under the knee. Place it under the heel.   Complete by: As directed ?  ? Driving restrictions   Complete by: As directed ?  ? No driving for two weeks  ? Post-operative opioid taper instructions:   Complete by: As directed ?  ? POST-OPERATIVE OPIOID TAPER INSTRUCTIONS: ?It is important to wean off of your opioid medication as soon as possible. If you do not need pain medication after your surgery it is ok to stop day one. ?Opioids include: ?Codeine, Hydrocodone(Norco, Vicodin), Oxycodone(Percocet, oxycontin) and hydromorphone amongst others.  ?Long term and even short term use of opiods can cause: ?Increased pain  response ?Dependence ?Constipation ?Depression ?Respiratory depression ?And more.  ?Withdrawal symptoms can include ?Flu like symptoms ?Nausea, vomiting ?And more ?Techniques to manage these symptoms ?Hydrate well ?Eat regular healthy meals ?Stay active ?Use relaxation techniques(deep breathing, meditating, yoga) ?Do Not substitute Alcohol to help with tapering ?If you have been on opioids for less than two weeks and do not have pain than it is ok to stop all together.  ?Plan to wean off of opioids ?This plan should start within one  week post op of your joint replacement. ?Maintain the same interval or time between taking each dose and first decrease the dose.  ?Cut the total daily intake of opioids by one tablet each day ?Next start to increase the time between doses. ?The last dose that should be eliminated is the evening dose.  ? ?  ? TED hose   Complete by: As directed ?  ? Use stockings (TED hose) for three weeks on both leg(s).  You may remove them at night for sleeping.  ? Weight bearing as tolerated   Complete by: As directed ?  ? ?  ? ? ? Follow-up Information   ? ? Gaynelle Arabian, MD. Schedule an appointment as soon as possible for a visit in 2 week(s).   ?Specialty: Orthopedic Surgery ?Contact information: ?[address] ?[address] ?[place] [address] ?[phone] ? ? ?  ?  ? ? Myrlene Broker, MD. Schedule an appointment as soon as possible for a visit in 1 week(s).   ?Specialty: Urology ?Why: Follow up with Urology for Foley Catheter removal ?Contact information: ?[address] ?[place] [address] ?[phone] ? ? ?  ?  ? ?  ?  ? ?  ? ? ? ?Signed: ?Theresa Duty ?11/02/2021, 10:03 AM ? ? ? ?

## 2021-11-03 DIAGNOSIS — M1712 Unilateral primary osteoarthritis, left knee: Secondary | ICD-10-CM | POA: Diagnosis not present

## 2021-11-03 DIAGNOSIS — M1711 Unilateral primary osteoarthritis, right knee: Secondary | ICD-10-CM | POA: Diagnosis not present

## 2021-11-08 DIAGNOSIS — M1711 Unilateral primary osteoarthritis, right knee: Secondary | ICD-10-CM | POA: Diagnosis not present

## 2021-11-08 DIAGNOSIS — M1712 Unilateral primary osteoarthritis, left knee: Secondary | ICD-10-CM | POA: Diagnosis not present

## 2021-11-10 DIAGNOSIS — M1711 Unilateral primary osteoarthritis, right knee: Secondary | ICD-10-CM | POA: Diagnosis not present

## 2021-11-10 DIAGNOSIS — M1712 Unilateral primary osteoarthritis, left knee: Secondary | ICD-10-CM | POA: Diagnosis not present

## 2021-11-15 DIAGNOSIS — M1712 Unilateral primary osteoarthritis, left knee: Secondary | ICD-10-CM | POA: Diagnosis not present

## 2021-11-17 DIAGNOSIS — M1712 Unilateral primary osteoarthritis, left knee: Secondary | ICD-10-CM | POA: Diagnosis not present

## 2021-11-17 DIAGNOSIS — M1711 Unilateral primary osteoarthritis, right knee: Secondary | ICD-10-CM | POA: Diagnosis not present

## 2021-11-21 DIAGNOSIS — M1712 Unilateral primary osteoarthritis, left knee: Secondary | ICD-10-CM | POA: Diagnosis not present

## 2021-11-23 DIAGNOSIS — M1712 Unilateral primary osteoarthritis, left knee: Secondary | ICD-10-CM | POA: Diagnosis not present

## 2021-11-23 DIAGNOSIS — M1711 Unilateral primary osteoarthritis, right knee: Secondary | ICD-10-CM | POA: Diagnosis not present

## 2021-11-24 DIAGNOSIS — R339 Retention of urine, unspecified: Secondary | ICD-10-CM | POA: Diagnosis not present

## 2021-11-24 DIAGNOSIS — C61 Malignant neoplasm of prostate: Secondary | ICD-10-CM | POA: Diagnosis not present

## 2021-11-29 DIAGNOSIS — M1712 Unilateral primary osteoarthritis, left knee: Secondary | ICD-10-CM | POA: Diagnosis not present

## 2021-12-01 DIAGNOSIS — M1712 Unilateral primary osteoarthritis, left knee: Secondary | ICD-10-CM | POA: Diagnosis not present

## 2021-12-06 DIAGNOSIS — M1712 Unilateral primary osteoarthritis, left knee: Secondary | ICD-10-CM | POA: Diagnosis not present

## 2021-12-06 DIAGNOSIS — Z4789 Encounter for other orthopedic aftercare: Secondary | ICD-10-CM | POA: Diagnosis not present

## 2021-12-06 DIAGNOSIS — M25562 Pain in left knee: Secondary | ICD-10-CM | POA: Diagnosis not present

## 2021-12-08 DIAGNOSIS — M1712 Unilateral primary osteoarthritis, left knee: Secondary | ICD-10-CM | POA: Diagnosis not present

## 2021-12-13 DIAGNOSIS — M1712 Unilateral primary osteoarthritis, left knee: Secondary | ICD-10-CM | POA: Diagnosis not present

## 2021-12-15 DIAGNOSIS — M1712 Unilateral primary osteoarthritis, left knee: Secondary | ICD-10-CM | POA: Diagnosis not present

## 2022-01-12 ENCOUNTER — Other Ambulatory Visit (HOSPITAL_COMMUNITY): Payer: BC Managed Care – PPO

## 2022-01-24 NOTE — H&P (Signed)
TOTAL KNEE ADMISSION H&P  Patient is being admitted for right total knee arthroplasty.  Subjective:  Chief Complaint: Right knee pain.  HPI: Shawn Bell, 81 y.o. male has a history of pain and functional disability in the right knee due to arthritis and has failed non-surgical conservative treatments for greater than 12 weeks to include NSAID's and/or analgesics, corticosteriod injections, and activity modification. Onset of symptoms was gradual, starting several years ago with gradually worsening course since that time. The patient noted no past surgery on the right knee.  Patient currently rates pain in the right knee at 7 out of 10 with activity. Patient has night pain, worsening of pain with activity and weight bearing, and pain that interferes with activities of daily living. Patient has evidence of  bone-on-bone arthritis in the medial and patellofemoral compartments of the bilateral knees  by imaging studies. There is no active infection.  Patient Active Problem List   Diagnosis Date Noted   OA (osteoarthritis) of knee 10/31/2021   Primary osteoarthritis of right knee 10/31/2021   Left inguinal hernia 05/06/2018   S/p reverse total shoulder arthroplasty 05/03/2017   Weakness 07/03/2015   Ataxia 07/03/2015   Double vision 07/03/2015   Hyperlipemia    History of prostate cancer    Postop Hyponatremia 03/07/2012   OA (osteoarthritis) of hip 03/06/2012   Cholelithiasis with cholecystitis 01/30/2012   Prostate cancer ([name]) 01/02/2012   Lipid disorder 01/02/2012   Heme positive stool 01/02/2012   DIVERTICULOSIS-COLON 10/23/2008   ANAL FISSURE 10/23/2008    Past Medical History:  Diagnosis Date   A-fib [facility])    Episode x1   Arthritis    shoulders   Diverticulosis of colon (without mention of hemorrhage)    Hemorrhoids    History of prostate cancer    S/P RADIATION [place]   History of radiation therapy    Hyperlipemia    Incomplete RBBB 12/2019   Intermittent  self-catheterization of bladder    ongoing; has been self catherizing for 5 years without any issues    OA (osteoarthritis) of knee 10/31/2021   Personal history of colonic polyps 01/23/2003   pt denies   Prostate cancer [facility])     Past Surgical History:  Procedure Laterality Date   CATARACT EXTRACTION W/ INTRAOCULAR LENS  IMPLANT, BILATERAL     CHOLECYSTECTOMY  02/07/2012   Procedure: LAPAROSCOPIC CHOLECYSTECTOMY WITH INTRAOPERATIVE CHOLANGIOGRAM;  Surgeon: Earnstine Regal, MD;  Location: [facility];  Service: General;  Laterality: N/A;   COLONOSCOPY     CYSTOSCOPY  10/31/2021   Procedure: Erlene Quan;  Surgeon: Gaynelle Arabian, MD;  Location: [facility];  Service: Orthopedics;;   INGUINAL HERNIA REPAIR Right 05/07/2018   Procedure: OPEN RIGHT INGUINAL HERNIA REPAIR WITH INSERTION OF MESH;  Surgeon: Armandina Gemma, MD;  Location: [facility];  Service: General;  Laterality: Right;   INGUINAL HERNIA REPAIR Left 03/12/2020   Procedure: OPEN LEFT INGUINAL HERNIA REPAIR WITH MESH;  Surgeon: Armandina Gemma, MD;  Location: [facility];  Service: General;  Laterality: Left;   JOINT REPLACEMENT     REVERSE SHOULDER ARTHROPLASTY Left 05/03/2017   Procedure: REVERSE LEFT SHOULDER ARTHROPLASTY;  Surgeon: Justice Britain, MD;  Location: [place];  Service: Orthopedics;  Laterality: Left;   REVERSE SHOULDER ARTHROPLASTY Right 09/27/2017   Procedure: RIGHT REVERSE SHOULDER ARTHROPLASTY;  Surgeon: Justice Britain, MD;  Location: [place];  Service: Orthopedics;  Laterality: Right;   SHOULDER ARTHROSCOPY     BILATERAL   TOTAL HIP ARTHROPLASTY  right   TOTAL HIP ARTHROPLASTY  03/06/2012   Procedure: TOTAL HIP ARTHROPLASTY;  Surgeon: Gearlean Alf, MD;  Location: [facility];  Service: Orthopedics;  Laterality: Left;   TOTAL KNEE ARTHROPLASTY Left 10/31/2021   Procedure: TOTAL KNEE ARTHROPLASTY;  Surgeon: Gaynelle Arabian, MD;  Location: [facility];  Service: Orthopedics;  Laterality: Left;    Prior to Admission medications   Medication  Sig Start Date End Date Taking? Authorizing Provider  Bacillus Coagulans-Inulin (PROBIOTIC-PREBIOTIC PO) Take 2 capsules by mouth daily.    [provider]  methocarbamol (ROBAXIN) 500 MG tablet Take 1 tablet (500 mg total) by mouth every 6 (six) hours as needed for muscle spasms. 11/01/21   Edmisten, Kristie L, PA  oxyCODONE (OXY IR/ROXICODONE) 5 MG immediate release tablet Take 1-2 tablets (5-10 mg total) by mouth every 6 (six) hours as needed for severe pain. 11/01/21   Edmisten, Ok Anis, PA  rivaroxaban (XARELTO) 10 MG TABS tablet Take 1 tablet (10 mg total) by mouth daily with breakfast for 20 days. Then resume one 81 mg aspirin once a day. 11/01/21 11/21/21  Edmisten, Ok Anis, PA  rosuvastatin (CRESTOR) 5 MG tablet Take 1 tablet (5 mg total) by mouth daily. Needs office visit and labs-2nd notice Patient taking differently: Take 5 mg by mouth every evening. 12/23/12   Dunn, Areta Haber, PA-C  traMADol (ULTRAM) 50 MG tablet Take 1-2 tablets (50-100 mg total) by mouth every 6 (six) hours as needed for moderate pain. 11/01/21   Edmisten, Ok Anis, PA    No Known Allergies  Social History   Socioeconomic History   Marital status: Married    Spouse name: Not on file   Number of children: 2   Years of education: Not on file   Highest education level: Not on file  Occupational History   Occupation: Owner    Employer: [employer]  Tobacco Use   Smoking status: Never   Smokeless tobacco: Never  Vaping Use   Vaping Use: Never used  Substance and Sexual Activity   Alcohol use: Yes    Comment: rarely   Drug use: No   Sexual activity: Not on file  Other Topics Concern   Not on file  Social History Narrative   Not on file   Social Determinants of Health   Financial Resource Strain: Not on file  Food Insecurity: Not on file  Transportation Needs: Not on file  Physical Activity: Not on file  Stress: Not on file  Social Connections: Not on file  Intimate Partner Violence: Not on  file    Tobacco Use: Low Risk  (11/01/2021)   Patient History    Smoking Tobacco Use: Never    Smokeless Tobacco Use: Never    Passive Exposure: Not on file   Social History   Substance and Sexual Activity  Alcohol Use Yes   Comment: rarely    Family History  Problem Relation Age of Onset   Heart failure Mother    Rheumatic fever Father    Colon cancer Neg Hx    Colon polyps Neg Hx    Rectal cancer Neg Hx    Stomach cancer Neg Hx     Review of Systems  Constitutional:  Negative for chills and fever.  HENT: Negative.    Eyes: Negative.   Respiratory:  Negative for cough and shortness of breath.   Cardiovascular:  Negative for chest pain and palpitations.  Gastrointestinal:  Negative for abdominal pain, constipation, diarrhea, nausea and  vomiting.  Genitourinary:  Negative for dysuria, frequency and urgency.  Musculoskeletal:  Positive for joint pain.  Skin:  Negative for rash.   Objective:  Physical Exam: General: Alert and oriented x3, cooperative and pleasant, no acute distress.  Head: normocephalic, atraumatic, neck supple.  Eyes: EOMI. Abdomen: non-tender to palpation and soft, normoactive bowel sounds. Musculoskeletal: Bilateral Hip Exam:  The range of motion: normal without discomfort.   Right Knee Exam:  No effusion present. No swelling present.  The range of motion is: 5 to 120 degrees.  Moderate crepitus on range of motion of the knee.  Positive medial greater than lateral joint line tenderness.  The knee is stable.   Calves soft and nontender. Motor function intact in LE. Strength 5/5 LE bilaterally. Neuro: Distal pulses 2+. Sensation to light touch intact in LE.  Vital signs in last 24 hours: BP: ()/()  Arterial Line BP: ()/()   Imaging Review Plain radiographs demonstrate moderate degenerative joint disease of the right knee. The overall alignment is mild varus. The bone quality appears to be adequate for age and reported activity  level.  Assessment/Plan:  End stage arthritis, right knee   The patient history, physical examination, clinical judgment of the provider and imaging studies are consistent with end stage degenerative joint disease of the right knee and total knee arthroplasty is deemed medically necessary. The treatment options including medical management, injection therapy arthroscopy and arthroplasty were discussed at length. The risks and benefits of total knee arthroplasty were presented and reviewed. The risks due to aseptic loosening, infection, stiffness, patella tracking problems, thromboembolic complications and other imponderables were discussed. The patient acknowledged the explanation, agreed to proceed with the plan and consent was signed. Patient is being admitted for inpatient treatment for surgery, pain control, PT, OT, prophylactic antibiotics, VTE prophylaxis, progressive ambulation and ADLs and discharge planning. The patient is planning to be discharged  home .  Patient's anticipated LOS is less than 2 midnights, meeting these requirements: - Lives within 1 hour of care - Has a competent adult at home to recover with post-op - NO history of  - Chronic pain requiring opiods  - Diabetes  - Coronary Artery Disease  - Heart failure  - Heart attack  - Stroke  - DVT/VTE  - Cardiac arrhythmia  - Respiratory Failure/COPD  - Renal failure  - Anemia  - Advanced Liver disease  Therapy Plans: [facility] Disposition: Home with Grandson Planned DVT Prophylaxis: Xarelto 10 mg (hx prostate cancer) DME Needed: None PCP: Campbell Stall, NP (clearance received) Cardiologist: Adrian Prows, MD (clearance received) TXA: IV Allergies: NKDA Anesthesia Concerns: None BMI: 28 Last HgbA1c: n/a  Pharmacy: [place] [facility])  Other: -Per cardiologist, hold aspirin 7 days prior to surgery  - Patient was instructed on what medications to stop prior to  surgery. - Follow-up visit in 2 weeks with Dr. Wynelle Link - Begin physical therapy following surgery - Pre-operative lab work as pre-surgical testing - Prescriptions will be provided in hospital at time of discharge  R. Jaynie Bream, PA-C Orthopedic Surgery [facility] [place]

## 2022-01-31 ENCOUNTER — Other Ambulatory Visit (HOSPITAL_COMMUNITY): Payer: Self-pay

## 2022-02-02 ENCOUNTER — Other Ambulatory Visit (HOSPITAL_COMMUNITY): Payer: Self-pay | Admitting: *Deleted

## 2022-02-02 NOTE — Progress Notes (Addendum)
Anesthesia Review:  PCP: Campbell Stall  Cardiologist : DR Einar Gip- pt states he sees due to age  [place]- 09/09/21  Chest x-ray : EKG : 08/12/21  Echo : 2020  Stress test:2020  Cardiac Cath :  Activity level: can do a flight of stairs without difficutly  Sleep Study/ CPAP :  has cpap  Fasting Blood Sugar :      / Checks Blood Sugar -- times a day:   Blood Thinner/ Instructions /Last Dose: ASA / Instructions/ Last Dose :   PT has 2 maids at home per pt .   LEFt Knee done 10/31/21

## 2022-02-06 ENCOUNTER — Other Ambulatory Visit (HOSPITAL_COMMUNITY): Payer: Self-pay | Admitting: *Deleted

## 2022-02-06 NOTE — Progress Notes (Signed)
SURGICAL WAITING ROOM VISITATION Patients having surgery or a procedure may have no more than 2 support people in the waiting area - these visitors may rotate.   Children under the age of 81 must have an adult with them who is not the patient. If the patient needs to stay at the hospital during part of their recovery, the visitor guidelines for inpatient rooms apply. Pre-op nurse will coordinate an appropriate time for 1 support person to accompany patient in pre-op.  This support person may not rotate.    Please refer to the [facility] website for the visitor guidelines for Inpatients (after your surgery is over and you are in a regular room).       Your procedure is scheduled on:  02/13/22    Report to [facility] Main Entrance    Report to admitting at  0545 AM   Call this number if you have problems the morning of surgery [phone]   Do not eat food :After Midnight.   After Midnight you may have the following liquids until __ 0515____ AM/ DAY OF SURGERY  Water Non-Citrus Juices (without pulp, NO RED) Carbonated Beverages Black Coffee (NO MILK/CREAM OR CREAMERS, sugar ok)  Clear Tea (NO MILK/CREAM OR CREAMERS, sugar ok) regular and decaf                             Plain Jell-O (NO RED)                                           Fruit ices (not with fruit pulp, NO RED)                                     Popsicles (NO RED)                                                               Sports drinks like Gatorade (NO RED)              Drink 2 Ensure/G2 drinks AT 10:00 PM the night before surgery.        The day of surgery:  Drink ONE (1) Pre-Surgery Clear Ensure or G2 at AM the morning of surgery. Drink in one sitting. Do not sip.  This drink was given to you during your hospital  pre-op appointment visit. Nothing else to drink after completing the  Pre-Surgery Clear Ensure or G2.      Oral Hygiene is also important to reduce your risk of infection.                                     Remember - BRUSH YOUR TEETH THE MORNING OF SURGERY WITH YOUR REGULAR TOOTHPASTE   Do NOT smoke after Midnight   Take these medicines the morning of surgery with A SIP OF WATER:  none   DO NOT TAKE ANY ORAL DIABETIC MEDICATIONS DAY OF YOUR SURGERY  Bring CPAP mask and tubing day  of surgery.                              You may not have any metal on your body including hair pins, jewelry, and body piercing             Do not wear make-up, lotions, powders, perfumes/cologne, or deodorant  Do not wear nail polish including gel and S&S, artificial/acrylic nails, or any other type of covering on natural nails including finger and toenails. If you have artificial nails, gel coating, etc. that needs to be removed by a nail salon please have this removed prior to surgery or surgery may need to be canceled/ delayed if the surgeon/ anesthesia feels like they are unable to be safely monitored.   Do not shave  48 hours prior to surgery.               Men may shave face and neck.   Do not bring valuables to the hospital. [place].   Contacts, dentures or bridgework may not be worn into surgery.   Bring small overnight bag day of surgery.   DO NOT [place]. PHARMACY WILL DISPENSE MEDICATIONS LISTED ON YOUR MEDICATION LIST TO YOU DURING YOUR ADMISSION [place]!    Patients discharged on the day of surgery will not be allowed to drive home.  Someone NEEDS to stay with you for the first 24 hours after anesthesia.   Special Instructions: Bring a copy of your healthcare power of attorney and living will documents         the day of surgery if you haven't scanned them before.              Please read over the following fact sheets you were given: IF YOU HAVE QUESTIONS ABOUT YOUR PRE-OP INSTRUCTIONS PLEASE CALL [phone]     [facility] - Preparing for Surgery Before surgery, you can  play an important role.  Because skin is not sterile, your skin needs to be as free of germs as possible.  You can reduce the number of germs on your skin by washing with CHG (chlorahexidine gluconate) soap before surgery.  CHG is an antiseptic cleaner which kills germs and bonds with the skin to continue killing germs even after washing. Please DO NOT use if you have an allergy to CHG or antibacterial soaps.  If your skin becomes reddened/irritated stop using the CHG and inform your nurse when you arrive at Short Stay. Do not shave (including legs and underarms) for at least 48 hours prior to the first CHG shower.  You may shave your face/neck. Please follow these instructions carefully:  1.  Shower with CHG Soap the night before surgery and the  morning of Surgery.  2.  If you choose to wash your hair, wash your hair first as usual with your  normal  shampoo.  3.  After you shampoo, rinse your hair and body thoroughly to remove the  shampoo.                           4.  Use CHG as you would any other liquid soap.  You can apply chg directly  to the skin and wash  Gently with a scrungie or clean washcloth.  5.  Apply the CHG Soap to your body ONLY FROM THE NECK DOWN.   Do not use on face/ open                           Wound or open sores. Avoid contact with eyes, ears mouth and genitals (private parts).                       Wash face,  Genitals (private parts) with your normal soap.             6.  Wash thoroughly, paying special attention to the area where your surgery  will be performed.  7.  Thoroughly rinse your body with warm water from the neck down.  8.  DO NOT shower/wash with your normal soap after using and rinsing off  the CHG Soap.                9.  Pat yourself dry with a clean towel.            10.  Wear clean pajamas.            11.  Place clean sheets on your bed the night of your first shower and do not  sleep with pets. Day of Surgery : Do not apply any  lotions/deodorants the morning of surgery.  Please wear clean clothes to the hospital/surgery center.  FAILURE TO FOLLOW THESE INSTRUCTIONS MAY RESULT IN THE CANCELLATION OF YOUR SURGERY PATIENT SIGNATURE_________________________________  NURSE SIGNATURE__________________________________  ________________________________________________________________________

## 2022-02-07 ENCOUNTER — Other Ambulatory Visit: Payer: Self-pay

## 2022-02-07 ENCOUNTER — Encounter (HOSPITAL_COMMUNITY): Payer: Self-pay

## 2022-02-07 ENCOUNTER — Encounter (HOSPITAL_COMMUNITY)
Admission: RE | Admit: 2022-02-07 | Discharge: 2022-02-07 | Disposition: A | Discharge status: Home / Self Care | Payer: BC Managed Care – PPO | Source: Ambulatory Visit | Attending: Orthopedic Surgery | Admitting: Orthopedic Surgery

## 2022-02-07 DIAGNOSIS — Z01818 Encounter for other preprocedural examination: Secondary | ICD-10-CM | POA: Diagnosis not present

## 2022-02-07 HISTORY — DX: Sleep apnea, unspecified: G47.30

## 2022-02-07 LAB — SURGICAL PCR SCREEN
MRSA, PCR: NEGATIVE
Staphylococcus aureus: NEGATIVE

## 2022-02-07 LAB — CBC
HCT: 42.3 % (ref 39.0–52.0)
Hemoglobin: 13.7 g/dL (ref 13.0–17.0)
MCH: 30.8 pg (ref 26.0–34.0)
MCHC: 32.4 g/dL (ref 30.0–36.0)
MCV: 95.1 fL (ref 80.0–100.0)
Platelets: 247 10*3/uL (ref 150–400)
RBC: 4.45 MIL/uL (ref 4.22–5.81)
RDW: 13.3 % (ref 11.5–15.5)
WBC: 5.6 10*3/uL (ref 4.0–10.5)
nRBC: 0 % (ref 0.0–0.2)

## 2022-02-07 LAB — BASIC METABOLIC PANEL
Anion gap: 7 (ref 5–15)
BUN: 13 mg/dL (ref 8–23)
CO2: 25 mmol/L (ref 22–32)
Calcium: 9.5 mg/dL (ref 8.9–10.3)
Chloride: 108 mmol/L (ref 98–111)
Creatinine, Ser: 1.15 mg/dL (ref 0.61–1.24)
GFR, Estimated: >60 mL/min (ref >60)
Glucose, Bld: 118 mg/dL — ABNORMAL HIGH (ref 70–99)
Potassium: 4.3 mmol/L (ref 3.5–5.1)
Sodium: 140 mmol/L (ref 135–145)

## 2022-02-12 NOTE — Anesthesia Preprocedure Evaluation (Signed)
Anesthesia Evaluation  Patient identified by MRN, date of birth, ID band Patient awake    Reviewed: Allergy & Precautions, H&P , NPO status , Patient's Chart, lab work & pertinent test results  Airway Mallampati: III  TM Distance: >3 FB Neck ROM: Full    Dental no notable dental hx. (+) Teeth Intact, Dental Advisory Given   Pulmonary sleep apnea and Continuous Positive Airway Pressure Ventilation ,    Pulmonary exam normal breath sounds clear to auscultation       Cardiovascular Exercise Tolerance: Good + dysrhythmias Atrial Fibrillation  Rhythm:Regular Rate:Normal     Neuro/Psych negative neurological ROS  negative psych ROS   GI/Hepatic negative GI ROS, Neg liver ROS,   Endo/Other  negative endocrine ROS  Renal/GU negative Renal ROS  negative genitourinary   Musculoskeletal  (+) Arthritis ,   Abdominal   Peds  Hematology negative hematology ROS (+)   Anesthesia Other Findings   Reproductive/Obstetrics negative OB ROS                            Anesthesia Physical Anesthesia Plan  ASA: 3  Anesthesia Plan: Spinal   Post-op Pain Management: Ofirmev IV (intra-op)* and Regional block*   Induction: Intravenous  PONV Risk Score and Plan: 2 and Propofol infusion and Ondansetron  Airway Management Planned: Natural Airway and Simple Face Mask  Additional Equipment:   Intra-op Plan:   Post-operative Plan:   Informed Consent: I have reviewed the patients History and Physical, chart, labs and discussed the procedure including the risks, benefits and alternatives for the proposed anesthesia with the patient or authorized representative who has indicated his/her understanding and acceptance.     Dental advisory given  Plan Discussed with: CRNA  Anesthesia Plan Comments:        Anesthesia Quick Evaluation

## 2022-02-13 ENCOUNTER — Ambulatory Visit (HOSPITAL_COMMUNITY): Payer: BC Managed Care – PPO | Admitting: Anesthesiology

## 2022-02-13 ENCOUNTER — Other Ambulatory Visit: Payer: Self-pay

## 2022-02-13 ENCOUNTER — Encounter (HOSPITAL_COMMUNITY): Payer: Self-pay | Admitting: Orthopedic Surgery

## 2022-02-13 ENCOUNTER — Ambulatory Visit (HOSPITAL_COMMUNITY): Payer: BC Managed Care – PPO | Admitting: Physician Assistant

## 2022-02-13 ENCOUNTER — Observation Stay (HOSPITAL_COMMUNITY)
Admission: RE | Admit: 2022-02-13 | Discharge: 2022-02-14 | Disposition: A | Discharge status: Home / Self Care | Payer: BC Managed Care – PPO | Source: Ambulatory Visit | Attending: Orthopedic Surgery | Admitting: Orthopedic Surgery

## 2022-02-13 ENCOUNTER — Encounter (HOSPITAL_COMMUNITY)
Admission: RE | Disposition: A | Discharge status: Home / Self Care | Payer: Self-pay | Source: Ambulatory Visit | Attending: Orthopedic Surgery

## 2022-02-13 DIAGNOSIS — Z96652 Presence of left artificial knee joint: Secondary | ICD-10-CM | POA: Insufficient documentation

## 2022-02-13 DIAGNOSIS — G8918 Other acute postprocedural pain: Secondary | ICD-10-CM | POA: Diagnosis not present

## 2022-02-13 DIAGNOSIS — Z7901 Long term (current) use of anticoagulants: Secondary | ICD-10-CM | POA: Insufficient documentation

## 2022-02-13 DIAGNOSIS — I4891 Unspecified atrial fibrillation: Secondary | ICD-10-CM | POA: Diagnosis not present

## 2022-02-13 DIAGNOSIS — Z8546 Personal history of malignant neoplasm of prostate: Secondary | ICD-10-CM | POA: Insufficient documentation

## 2022-02-13 DIAGNOSIS — Z79899 Other long term (current) drug therapy: Secondary | ICD-10-CM | POA: Diagnosis not present

## 2022-02-13 DIAGNOSIS — Z01818 Encounter for other preprocedural examination: Secondary | ICD-10-CM

## 2022-02-13 DIAGNOSIS — Z96643 Presence of artificial hip joint, bilateral: Secondary | ICD-10-CM | POA: Diagnosis not present

## 2022-02-13 DIAGNOSIS — M1711 Unilateral primary osteoarthritis, right knee: Secondary | ICD-10-CM | POA: Diagnosis not present

## 2022-02-13 DIAGNOSIS — Z9989 Dependence on other enabling machines and devices: Secondary | ICD-10-CM | POA: Diagnosis not present

## 2022-02-13 DIAGNOSIS — Z96611 Presence of right artificial shoulder joint: Secondary | ICD-10-CM | POA: Diagnosis not present

## 2022-02-13 DIAGNOSIS — G4733 Obstructive sleep apnea (adult) (pediatric): Secondary | ICD-10-CM | POA: Diagnosis not present

## 2022-02-13 DIAGNOSIS — Z96612 Presence of left artificial shoulder joint: Secondary | ICD-10-CM | POA: Insufficient documentation

## 2022-02-13 DIAGNOSIS — M179 Osteoarthritis of knee, unspecified: Secondary | ICD-10-CM | POA: Diagnosis present

## 2022-02-13 HISTORY — PX: TOTAL KNEE ARTHROPLASTY: SHX125

## 2022-02-13 SURGERY — ARTHROPLASTY, KNEE, TOTAL
Anesthesia: Spinal | Site: Knee | Laterality: Right

## 2022-02-13 MED ORDER — METOCLOPRAMIDE HCL 5 MG/ML IJ SOLN: 5.0000 mg | Freq: Three times a day (TID) | INTRAMUSCULAR | Status: DC | PRN

## 2022-02-13 MED ORDER — METHOCARBAMOL 500 MG PO TABS
500.0000 mg | ORAL_TABLET | Freq: Four times a day (QID) | ORAL | Status: DC | PRN
  Administered 2022-02-13 – 2022-02-14 (×2): 500 mg via ORAL
  Filled 2022-02-13 (×2): qty 1

## 2022-02-13 MED ORDER — MENTHOL 3 MG MT LOZG: 1.0000 | LOZENGE | OROMUCOSAL | Status: DC | PRN

## 2022-02-13 MED ORDER — STERILE WATER FOR IRRIGATION IR SOLN
Status: DC | PRN
  Administered 2022-02-13: 2000 mL

## 2022-02-13 MED ORDER — ONDANSETRON HCL 4 MG/2ML IJ SOLN
INTRAMUSCULAR | Status: AC
  Filled 2022-02-13: qty 2

## 2022-02-13 MED ORDER — ONDANSETRON HCL 4 MG/2ML IJ SOLN: 4.0000 mg | Freq: Four times a day (QID) | INTRAMUSCULAR | Status: DC | PRN

## 2022-02-13 MED ORDER — SODIUM CHLORIDE 0.9 % IV SOLN: INTRAVENOUS | Status: DC

## 2022-02-13 MED ORDER — ACETAMINOPHEN 500 MG PO TABS
1000.0000 mg | ORAL_TABLET | Freq: Four times a day (QID) | ORAL | Status: DC
  Administered 2022-02-13 – 2022-02-14 (×3): 1000 mg via ORAL
  Filled 2022-02-13 (×3): qty 2

## 2022-02-13 MED ORDER — TRANEXAMIC ACID-NACL 1000-0.7 MG/100ML-% IV SOLN
1000.0000 mg | INTRAVENOUS | Status: AC
  Administered 2022-02-13: 1000 mg via INTRAVENOUS
  Filled 2022-02-13: qty 100

## 2022-02-13 MED ORDER — LACTATED RINGERS IV SOLN: INTRAVENOUS | Status: DC

## 2022-02-13 MED ORDER — CEFAZOLIN SODIUM-DEXTROSE 2-4 GM/100ML-% IV SOLN
2.0000 g | Freq: Four times a day (QID) | INTRAVENOUS | Status: AC
  Administered 2022-02-13 (×2): 2 g via INTRAVENOUS
  Filled 2022-02-13 (×2): qty 100

## 2022-02-13 MED ORDER — DOCUSATE SODIUM 100 MG PO CAPS
100.0000 mg | ORAL_CAPSULE | Freq: Two times a day (BID) | ORAL | Status: DC
  Administered 2022-02-13 – 2022-02-14 (×2): 100 mg via ORAL
  Filled 2022-02-13 (×2): qty 1

## 2022-02-13 MED ORDER — BUPIVACAINE LIPOSOME 1.3 % IJ SUSP
INTRAMUSCULAR | Status: AC
  Filled 2022-02-13: qty 20

## 2022-02-13 MED ORDER — ONDANSETRON HCL 4 MG/2ML IJ SOLN
INTRAMUSCULAR | Status: DC | PRN
  Administered 2022-02-13: 4 mg via INTRAVENOUS

## 2022-02-13 MED ORDER — ACETAMINOPHEN 10 MG/ML IV SOLN
1000.0000 mg | Freq: Four times a day (QID) | INTRAVENOUS | Status: DC
  Administered 2022-02-13: 1000 mg via INTRAVENOUS
  Filled 2022-02-13: qty 100

## 2022-02-13 MED ORDER — FENTANYL CITRATE PF 50 MCG/ML IJ SOSY: 25.0000 ug | PREFILLED_SYRINGE | INTRAMUSCULAR | Status: DC | PRN

## 2022-02-13 MED ORDER — METOCLOPRAMIDE HCL 5 MG PO TABS: 5.0000 mg | ORAL_TABLET | Freq: Three times a day (TID) | ORAL | Status: DC | PRN

## 2022-02-13 MED ORDER — MORPHINE SULFATE (PF) 2 MG/ML IV SOLN: 1.0000 mg | INTRAVENOUS | Status: DC | PRN

## 2022-02-13 MED ORDER — PROPOFOL 500 MG/50ML IV EMUL
INTRAVENOUS | Status: AC
  Filled 2022-02-13: qty 50

## 2022-02-13 MED ORDER — FENTANYL CITRATE PF 50 MCG/ML IJ SOSY
50.0000 ug | PREFILLED_SYRINGE | INTRAMUSCULAR | Status: AC
  Administered 2022-02-13: 50 ug via INTRAVENOUS

## 2022-02-13 MED ORDER — DEXAMETHASONE SODIUM PHOSPHATE 10 MG/ML IJ SOLN
10.0000 mg | Freq: Once | INTRAMUSCULAR | Status: AC
  Administered 2022-02-14: 10 mg via INTRAVENOUS
  Filled 2022-02-13: qty 1

## 2022-02-13 MED ORDER — PROPOFOL 1000 MG/100ML IV EMUL
INTRAVENOUS | Status: AC
  Filled 2022-02-13: qty 100

## 2022-02-13 MED ORDER — SODIUM CHLORIDE 0.9 % IR SOLN
Status: DC | PRN
  Administered 2022-02-13: 1000 mL

## 2022-02-13 MED ORDER — ONDANSETRON HCL 4 MG PO TABS: 4.0000 mg | ORAL_TABLET | Freq: Four times a day (QID) | ORAL | Status: DC | PRN

## 2022-02-13 MED ORDER — PROPOFOL 500 MG/50ML IV EMUL
INTRAVENOUS | Status: DC | PRN
  Administered 2022-02-13: 125 ug/kg/min via INTRAVENOUS

## 2022-02-13 MED ORDER — OXYCODONE HCL 5 MG PO TABS
5.0000 mg | ORAL_TABLET | ORAL | Status: DC | PRN
  Administered 2022-02-13 – 2022-02-14 (×4): 10 mg via ORAL
  Filled 2022-02-13 (×4): qty 2

## 2022-02-13 MED ORDER — EPHEDRINE 5 MG/ML INJ
INTRAVENOUS | Status: AC
  Filled 2022-02-13: qty 5

## 2022-02-13 MED ORDER — POLYETHYLENE GLYCOL 3350 17 G PO PACK
17.0000 g | PACK | Freq: Every day | ORAL | Status: DC | PRN
Start: 2022-02-13 — End: 2022-02-14

## 2022-02-13 MED ORDER — TRAMADOL HCL 50 MG PO TABS: 50.0000 mg | ORAL_TABLET | Freq: Four times a day (QID) | ORAL | Status: DC | PRN

## 2022-02-13 MED ORDER — ROSUVASTATIN CALCIUM 5 MG PO TABS: 5.0000 mg | ORAL_TABLET | Freq: Every evening | ORAL | Status: DC

## 2022-02-13 MED ORDER — SODIUM CHLORIDE (PF) 0.9 % IJ SOLN
INTRAMUSCULAR | Status: DC | PRN
  Administered 2022-02-13: 60 mL

## 2022-02-13 MED ORDER — EPHEDRINE SULFATE-NACL 50-0.9 MG/10ML-% IV SOSY
PREFILLED_SYRINGE | INTRAVENOUS | Status: DC | PRN
  Administered 2022-02-13 (×5): 5 mg via INTRAVENOUS

## 2022-02-13 MED ORDER — BUPIVACAINE-EPINEPHRINE (PF) 0.5% -1:200000 IJ SOLN
INTRAMUSCULAR | Status: DC | PRN
  Administered 2022-02-13: 20 mL via PERINEURAL

## 2022-02-13 MED ORDER — BUPIVACAINE LIPOSOME 1.3 % IJ SUSP: 20.0000 mL | Freq: Once | INTRAMUSCULAR | Status: AC

## 2022-02-13 MED ORDER — 0.9 % SODIUM CHLORIDE (POUR BTL) OPTIME
TOPICAL | Status: DC | PRN
  Administered 2022-02-13: 1000 mL

## 2022-02-13 MED ORDER — CHLORHEXIDINE GLUCONATE 0.12 % MT SOLN
15.0000 mL | Freq: Once | OROMUCOSAL | Status: AC
  Administered 2022-02-13: 15 mL via OROMUCOSAL

## 2022-02-13 MED ORDER — METHOCARBAMOL 500 MG IVPB - SIMPLE MED: 500.0000 mg | Freq: Four times a day (QID) | INTRAVENOUS | Status: DC | PRN

## 2022-02-13 MED ORDER — SODIUM CHLORIDE (PF) 0.9 % IJ SOLN
INTRAMUSCULAR | Status: AC
  Filled 2022-02-13: qty 50

## 2022-02-13 MED ORDER — PHENOL 1.4 % MT LIQD: 1.0000 | OROMUCOSAL | Status: DC | PRN

## 2022-02-13 MED ORDER — CEFAZOLIN SODIUM-DEXTROSE 2-4 GM/100ML-% IV SOLN
2.0000 g | INTRAVENOUS | Status: AC
  Administered 2022-02-13: 2 g via INTRAVENOUS
  Filled 2022-02-13: qty 100

## 2022-02-13 MED ORDER — ZOLPIDEM TARTRATE 5 MG PO TABS
5.0000 mg | ORAL_TABLET | Freq: Every evening | ORAL | Status: DC | PRN
  Administered 2022-02-13: 5 mg via ORAL
  Filled 2022-02-13: qty 1

## 2022-02-13 MED ORDER — PROPOFOL 10 MG/ML IV BOLUS
INTRAVENOUS | Status: DC | PRN
  Administered 2022-02-13 (×3): 20 mg via INTRAVENOUS

## 2022-02-13 MED ORDER — DEXAMETHASONE SODIUM PHOSPHATE 10 MG/ML IJ SOLN
8.0000 mg | Freq: Once | INTRAMUSCULAR | Status: AC
  Administered 2022-02-13: 10 mg via INTRAVENOUS

## 2022-02-13 MED ORDER — PHENYLEPHRINE 80 MCG/ML (10ML) SYRINGE FOR IV PUSH (FOR BLOOD PRESSURE SUPPORT)
PREFILLED_SYRINGE | INTRAVENOUS | Status: DC | PRN
  Administered 2022-02-13 (×2): 80 ug via INTRAVENOUS
  Administered 2022-02-13: 160 ug via INTRAVENOUS
  Administered 2022-02-13 (×8): 80 ug via INTRAVENOUS

## 2022-02-13 MED ORDER — PHENYLEPHRINE 80 MCG/ML (10ML) SYRINGE FOR IV PUSH (FOR BLOOD PRESSURE SUPPORT)
PREFILLED_SYRINGE | INTRAVENOUS | Status: AC
  Filled 2022-02-13: qty 10

## 2022-02-13 MED ORDER — BISACODYL 10 MG RE SUPP: 10.0000 mg | Freq: Every day | RECTAL | Status: DC | PRN

## 2022-02-13 MED ORDER — POVIDONE-IODINE 10 % EX SWAB
2.0000 | Freq: Once | CUTANEOUS | Status: AC
  Administered 2022-02-13: 2 via TOPICAL

## 2022-02-13 MED ORDER — DEXAMETHASONE SODIUM PHOSPHATE 10 MG/ML IJ SOLN
INTRAMUSCULAR | Status: AC
  Filled 2022-02-13: qty 1

## 2022-02-13 MED ORDER — FLEET ENEMA 7-19 GM/118ML RE ENEM
1.0000 | ENEMA | Freq: Once | RECTAL | Status: DC | PRN
Start: 2022-02-13 — End: 2022-02-14

## 2022-02-13 MED ORDER — FENTANYL CITRATE PF 50 MCG/ML IJ SOSY
PREFILLED_SYRINGE | INTRAMUSCULAR | Status: AC
  Filled 2022-02-13: qty 2

## 2022-02-13 MED ORDER — DIPHENHYDRAMINE HCL 12.5 MG/5ML PO ELIX: 12.5000 mg | ORAL_SOLUTION | ORAL | Status: DC | PRN

## 2022-02-13 MED ORDER — BUPIVACAINE IN DEXTROSE 0.75-8.25 % IT SOLN
INTRATHECAL | Status: DC | PRN
  Administered 2022-02-13: 1.8 mL via INTRATHECAL

## 2022-02-13 MED ORDER — SODIUM CHLORIDE (PF) 0.9 % IJ SOLN
INTRAMUSCULAR | Status: AC
  Filled 2022-02-13: qty 10

## 2022-02-13 MED ORDER — BUPIVACAINE LIPOSOME 1.3 % IJ SUSP
INTRAMUSCULAR | Status: DC | PRN
  Administered 2022-02-13: 20 mL

## 2022-02-13 MED ORDER — ORAL CARE MOUTH RINSE: 15.0000 mL | Freq: Once | OROMUCOSAL | Status: AC

## 2022-02-13 MED ORDER — RIVAROXABAN 10 MG PO TABS
10.0000 mg | ORAL_TABLET | Freq: Every day | ORAL | Status: DC
  Administered 2022-02-14: 10 mg via ORAL
  Filled 2022-02-13: qty 1

## 2022-02-13 SURGICAL SUPPLY — 59 items
ATTUNE MED DOME PAT 38 KNEE (Knees) IMPLANT
ATTUNE PS FEM RT SZ 8 CEM KNEE (Femur) IMPLANT
ATTUNE PSRP INSR SZ8 10 KNEE (Insert) IMPLANT
BAG COUNTER SPONGE SURGICOUNT (BAG) IMPLANT
BAG SPEC THK2 15X12 ZIP CLS (MISCELLANEOUS) ×1
BAG SPNG CNTER NS LX DISP (BAG) ×1
BAG ZIPLOCK 12X15 (MISCELLANEOUS) ×1 IMPLANT
BASE TIBIAL ROT PLAT SZ 8 KNEE (Knees) IMPLANT
BLADE SAG 18X100X1.27 (BLADE) ×1 IMPLANT
BLADE SAW SGTL 11.0X1.19X90.0M (BLADE) ×1 IMPLANT
BNDG ELASTIC 6X5.8 VLCR STR LF (GAUZE/BANDAGES/DRESSINGS) ×1 IMPLANT
BOWL SMART MIX CTS (DISPOSABLE) ×1 IMPLANT
BSPLAT TIB 8 CMNT ROT PLAT STR (Knees) ×1 IMPLANT
CEMENT HV SMART SET (Cement) ×2 IMPLANT
CLOSURE STERI STRIP 1/2 X4 (GAUZE/BANDAGES/DRESSINGS) IMPLANT
COVER SURGICAL LIGHT HANDLE (MISCELLANEOUS) ×1 IMPLANT
CUFF TOURN SGL QUICK 34 (TOURNIQUET CUFF) ×1
CUFF TRNQT CYL 34X4.125X (TOURNIQUET CUFF) ×1 IMPLANT
DRAPE INCISE IOBAN 66X45 STRL (DRAPES) ×1 IMPLANT
DRAPE U-SHAPE 47X51 STRL (DRAPES) ×1 IMPLANT
DRSG AQUACEL AG ADV 3.5X10 (GAUZE/BANDAGES/DRESSINGS) ×1 IMPLANT
DURAPREP 26ML APPLICATOR (WOUND CARE) ×1 IMPLANT
ELECT REM PT RETURN 15FT ADLT (MISCELLANEOUS) ×1 IMPLANT
GLOVE BIO SURGEON STRL SZ 6.5 (GLOVE) IMPLANT
GLOVE BIO SURGEON STRL SZ7.5 (GLOVE) IMPLANT
GLOVE BIO SURGEON STRL SZ8 (GLOVE) ×1 IMPLANT
GLOVE BIOGEL PI IND STRL 6.5 (GLOVE) IMPLANT
GLOVE BIOGEL PI IND STRL 7.0 (GLOVE) IMPLANT
GLOVE BIOGEL PI IND STRL 8 (GLOVE) ×1 IMPLANT
GLOVE BIOGEL PI INDICATOR 6.5 (GLOVE) ×1
GLOVE BIOGEL PI INDICATOR 7.0 (GLOVE)
GLOVE BIOGEL PI INDICATOR 8 (GLOVE) ×1
GOWN STRL REUS W/ TWL LRG LVL3 (GOWN DISPOSABLE) ×1 IMPLANT
GOWN STRL REUS W/ TWL XL LVL3 (GOWN DISPOSABLE) IMPLANT
GOWN STRL REUS W/TWL LRG LVL3 (GOWN DISPOSABLE) ×1
GOWN STRL REUS W/TWL XL LVL3 (GOWN DISPOSABLE)
HANDPIECE INTERPULSE COAX TIP (DISPOSABLE) ×1
HOLDER FOLEY CATH W/STRAP (MISCELLANEOUS) IMPLANT
IMMOBILIZER KNEE 20 (SOFTGOODS) ×1
IMMOBILIZER KNEE 20 THIGH 36 (SOFTGOODS) ×1 IMPLANT
KIT TURNOVER KIT A (KITS) IMPLANT
MANIFOLD NEPTUNE II (INSTRUMENTS) ×1 IMPLANT
NS IRRIG 1000ML POUR BTL (IV SOLUTION) ×1 IMPLANT
PACK TOTAL KNEE CUSTOM (KITS) ×1 IMPLANT
PADDING CAST COTTON 6X4 STRL (CAST SUPPLIES) ×2 IMPLANT
PROTECTOR NERVE ULNAR (MISCELLANEOUS) ×1 IMPLANT
SET HNDPC FAN SPRY TIP SCT (DISPOSABLE) ×1 IMPLANT
SPIKE FLUID TRANSFER (MISCELLANEOUS) ×1 IMPLANT
STRIP CLOSURE SKIN 1/2X4 (GAUZE/BANDAGES/DRESSINGS) ×2 IMPLANT
SUT MNCRL AB 4-0 PS2 18 (SUTURE) ×1 IMPLANT
SUT STRATAFIX 0 PDS 27 VIOLET (SUTURE) ×1
SUT VIC AB 2-0 CT1 27 (SUTURE) ×3
SUT VIC AB 2-0 CT1 TAPERPNT 27 (SUTURE) ×3 IMPLANT
SUTURE STRATFX 0 PDS 27 VIOLET (SUTURE) ×1 IMPLANT
TIBIAL BASE ROT PLAT SZ 8 KNEE (Knees) ×1 IMPLANT
TRAY FOLEY MTR SLVR 16FR STAT (SET/KITS/TRAYS/PACK) ×1 IMPLANT
TUBE SUCTION HIGH CAP CLEAR NV (SUCTIONS) ×1 IMPLANT
WATER STERILE IRR 1000ML POUR (IV SOLUTION) ×2 IMPLANT
WRAP KNEE MAXI GEL POST OP (GAUZE/BANDAGES/DRESSINGS) ×1 IMPLANT

## 2022-02-13 NOTE — Progress Notes (Signed)
Orthopedic Tech Progress Note Patient Details:  Shawn Bell 12-21-1940 [id number]  CPM Right Knee CPM Right Knee: On Right Knee Flexion (Degrees): 40 Right Knee Extension (Degrees): 10  Post Interventions Patient Tolerated: Well Instructions Provided: Care of device CPM placed back on by PT at 1530. Vernona Rieger 02/13/2022, 4:00 PM

## 2022-02-13 NOTE — Interval H&P Note (Signed)
History and Physical Interval Note:  02/13/2022 6:30 AM  Shawn Bell  has presented today for surgery, with the diagnosis of right knee osteoarthritis.  The various methods of treatment have been discussed with the patient and family. After consideration of risks, benefits and other options for treatment, the patient has consented to  Procedure(s): TOTAL KNEE ARTHROPLASTY (Right) as a surgical intervention.  The patient's history has been reviewed, patient examined, no change in status, stable for surgery.  I have reviewed the patient's chart and labs.  Questions were answered to the patient's satisfaction.     Pilar Plate Ariellah Faust

## 2022-02-13 NOTE — Anesthesia Procedure Notes (Signed)
Spinal  Patient location during procedure: OR End time: 02/13/2022 8:19 AM Reason for block: surgical anesthesia Staffing Performed: resident/CRNA  Resident/CRNA: Marijo Conception, CRNA Performed by: Zeeshan Korte, Clinical cytogeneticist D, CRNA Authorized by: Roderic Palau, MD   Preanesthetic Checklist Completed: patient identified, IV checked, site marked, risks and benefits discussed, surgical consent, monitors and equipment checked, pre-op evaluation and timeout performed Spinal Block Patient position: sitting Prep: DuraPrep Patient monitoring: heart rate, continuous pulse ox and blood pressure Approach: midline Location: L3-4 Injection technique: single-shot Needle Needle type: Pencan  Needle gauge: 24 G Needle length: 9 cm Assessment Sensory level: T6 Events: CSF return

## 2022-02-13 NOTE — Plan of Care (Signed)
  Problem: Activity: Goal: Ability to avoid complications of mobility impairment will improve Outcome: Progressing   Problem: Education: Goal: Knowledge of the prescribed therapeutic regimen will improve Outcome: Progressing   Problem: Clinical Measurements: Goal: Postoperative complications will be avoided or minimized Outcome: Progressing   Problem: Pain Management: Goal: Pain level will decrease with appropriate interventions Outcome: Progressing

## 2022-02-13 NOTE — Progress Notes (Signed)
Orthopedic Tech Progress Note Patient Details:  Shawn Bell Dec 16, 1940 [id number]  CPM Right Knee CPM Right Knee: Off Right Knee Flexion (Degrees): 40 Right Knee Extension (Degrees): 10  Post Interventions Patient Tolerated: Well Instructions Provided: Care of device CPM removed. 4 hours completed. Vernona Rieger 02/13/2022, 5:47 PM

## 2022-02-13 NOTE — Transfer of Care (Signed)
Immediate Anesthesia Transfer of Care Note  Patient: Shawn Bell  Procedure(s) Performed: TOTAL KNEE ARTHROPLASTY (Right: Knee)  Patient Location: PACU  Anesthesia Type:Regional and Spinal  Level of Consciousness: awake, alert  and oriented  Airway & Oxygen Therapy: Patient Spontanous Breathing and Patient connected to face mask oxygen  Post-op Assessment: Report given to RN and Post -op Vital signs reviewed and stable  Post vital signs: Reviewed and stable  Last Vitals:  Vitals Value Taken Time  BP 108/70 02/13/22 1031  Temp    Pulse 80 02/13/22 1033  Resp 25 02/13/22 1033  SpO2 98 % 02/13/22 1033  Vitals shown include unvalidated device data.  Last Pain:  Vitals:   02/13/22 0624  TempSrc:   PainSc: 0-No pain      Patients Stated Pain Goal: 4 (95/32/02 3343)  Complications: No notable events documented.

## 2022-02-13 NOTE — Anesthesia Procedure Notes (Signed)
Anesthesia Regional Block: Adductor canal block   Pre-Anesthetic Checklist: , timeout performed,  Correct Patient, Correct Site, Correct Laterality,  Correct Procedure, Correct Position, site marked,  Risks and benefits discussed,  Pre-op evaluation,  At surgeon's request and post-op pain management  Laterality: Right  Prep: Maximum Sterile Barrier Precautions used, chloraprep       Needles:  Injection technique: Single-shot  Needle Type: Echogenic Stimulator Needle     Needle Length: 9cm  Needle Gauge: 21     Additional Needles:   Procedures:,,,, ultrasound used (permanent image in chart),,    Narrative:  Start time: 02/13/2022 7:34 AM End time: 02/13/2022 7:44 AM Injection made incrementally with aspirations every 5 mL.  Performed by: Personally  Anesthesiologist: Roderic Palau, MD

## 2022-02-13 NOTE — Progress Notes (Signed)
Orthopedic Tech Progress Note Patient Details:  AUTHOR HATLESTAD 1940/12/07 [id number]  CPM Right Knee CPM Right Knee: Off Right Knee Flexion (Degrees): 40 Right Knee Extension (Degrees): 10  Post Interventions Patient Tolerated: Well Instructions Provided: Care of device  [facility] 02/13/2022, 2:00 PM

## 2022-02-13 NOTE — Progress Notes (Signed)
Orthopedic Tech Progress Note Patient Details:  Shawn Bell [birth year] [id number]  CPM Right Knee CPM Right Knee: On Right Knee Flexion (Degrees): 40 Right Knee Extension (Degrees): 10  Post Interventions Patient Tolerated: Well RN called and said the patient wanted to go back in the CPM, but when I arrived I realized he was still in the machine and it was never started back up upon his arrival to 3W. Patient has been in his CPM since I placed him in it in PACU, and he has been sitting in it for over an hour in the 10-40 position because the machine was never started. 02/13/2022, 1:03 PM

## 2022-02-13 NOTE — Evaluation (Signed)
Physical Therapy Evaluation Patient Details Name: Shawn Bell MRN: [id number] DOB: 03/05/1941 Today's Date: 02/13/2022  History of Present Illness  Pt is an 81yo male presenting s/p R-TKA on 02/13/22. PMH: afib, hx of prostate CA s/p radiation, HLD, incomplete RBBB, OSA on CPAP, b/l reverse TSA, L-TKA 10/31/2021, b/l THA.   Clinical Impression  Shawn Bell is a 81 y.o. male POD 0 s/p R-TKA. Patient reports independence with mobility at baseline. Patient is now limited by functional impairments (see PT problem list below) and requires supervision for bed mobility and min guard for transfers. Patient was able to ambulate 15 feet with RW and min guard level of assist. Patient instructed in exercise to facilitate ROM and circulation to manage edema. Provided incentive spirometer and with Vcs pt able to achieve 2262m. At end of session pt supine in bed and per pt's request placed pt's R knee in CPM utilizing pre-set 10deg ext and 40deg flexion parameters and appropriate positioning; messaged orthopedic technician of parameters and start time to allow appropriate removal time per MD's protocol. Patient will benefit from continued skilled PT interventions to address impairments and progress towards PLOF. Acute PT will follow to progress mobility and stair training in preparation for safe discharge home.       Recommendations for follow up therapy are one component of a multi-disciplinary discharge planning process, led by the attending physician.  Recommendations may be updated based on patient status, additional functional criteria and insurance authorization.  Follow Up Recommendations Follow physician's recommendations for discharge plan and follow up therapies      Assistance Recommended at Discharge Set up Supervision/Assistance  Patient can return home with the following  A little help with walking and/or transfers;A little help with bathing/dressing/bathroom;Assistance with  cooking/housework;Assist for transportation;Help with stairs or ramp for entrance    Equipment Recommendations None recommended by PT  Recommendations for Other Services       Functional Status Assessment Patient has had a recent decline in their functional status and demonstrates the ability to make significant improvements in function in a reasonable and predictable amount of time.     Precautions / Restrictions Precautions Precautions: Fall Restrictions Weight Bearing Restrictions: No      Mobility  Bed Mobility Overal bed mobility: Needs Assistance Bed Mobility: Supine to Sit, Sit to Supine     Supine to sit: Supervision Sit to supine: Supervision   General bed mobility comments: For safety only, no physical assist required. Pt able to use LLE and BUE to scoot up in supine to repositon CPM    Transfers Overall transfer level: Needs assistance Equipment used: Rolling walker (2 wheels) Transfers: Sit to/from Stand Sit to Stand: Min guard, From elevated surface           General transfer comment: For safety only, no physical assist required    Ambulation/Gait Ambulation/Gait assistance: Min guard Gait Distance (Feet): 15 Feet Assistive device: Rolling walker (2 wheels) Gait Pattern/deviations: Step-to pattern, Decreased step length - right, Decreased stance time - right Gait velocity: decreased     General Gait Details: Pt ambulated with RW and min guard, no physical assist required or overt LOB noted.  Stairs            Wheelchair Mobility    Modified Rankin (Stroke Patients Only)       Balance Overall balance assessment: Needs assistance Sitting-balance support: Feet supported, No upper extremity supported Sitting balance-Leahy Scale: Good     Standing balance support: Reliant on  assistive device for balance, During functional activity, Bilateral upper extremity supported Standing balance-Leahy Scale: Poor                                Pertinent Vitals/Pain Pain Assessment Pain Assessment: 0-10 Pain Score: 4  Pain Location: Right knee Pain Descriptors / Indicators: Operative site guarding Pain Intervention(s): Limited activity within patient's tolerance, Monitored during session, Repositioned, Ice applied    Home Living Family/patient expects to be discharged to:: Private residence Living Arrangements: Spouse/significant other Available Help at Discharge: Family;Available 24 hours/day Type of Home: House Home Access: Stairs to enter Entrance Stairs-Rails: Right Entrance Stairs-Number of Steps: 2 Alternate Level Stairs-Number of Steps: elevator Home Layout: Multi-level Home Equipment: Conservation officer, nature (2 wheels);Rollator (4 wheels)      Prior Function Prior Level of Function : Independent/Modified Independent             Mobility Comments: IND, pt still works full time, 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, manufacturing Dominance   Dominant Hand: Right    Extremity/Trunk Assessment   Upper Extremity Assessment Upper Extremity Assessment: Overall WFL for tasks assessed    Lower Extremity Assessment Lower Extremity Assessment: RLE deficits/detail;LLE deficits/detail RLE Deficits / Details: MMT ank DF/PF 5/5, no extensor lag noted RLE Sensation: WNL LLE Deficits / Details: MMT ank DF/PF 5/5 LLE Sensation: WNL    Cervical / Trunk Assessment Cervical / Trunk Assessment: Normal  Communication   Communication: No difficulties  Cognition Arousal/Alertness: Awake/alert Behavior During Therapy: WFL for tasks assessed/performed Overall Cognitive Status: Within Functional Limits for tasks assessed                                          General Comments General comments (skin integrity, edema, etc.): Wife Leda Gauze present    Exercises Total Joint Exercises Ankle Circles/Pumps: AROM, Both, 10 reps, Supine   Assessment/Plan    PT Assessment Patient needs continued PT  services  PT Problem List Decreased strength;Decreased range of motion;Decreased activity tolerance;Decreased balance;Decreased mobility;Decreased coordination;Pain       PT Treatment Interventions DME instruction;Gait training;Stair training;Functional mobility training;Therapeutic activities;Therapeutic exercise;Balance training;Neuromuscular re-education;Patient/family education    PT Goals (Current goals can be found in the Care Plan section)  Acute Rehab PT Goals Patient Stated Goal: To walk without pain PT Goal Formulation: With patient Time For Goal Achievement: 02/20/22 Potential to Achieve Goals: Good    Frequency 7X/week     Co-evaluation               AM-PAC PT "6 Clicks" Mobility  Outcome Measure Help needed turning from your back to your side while in a flat bed without using bedrails?: None Help needed moving from lying on your back to sitting on the side of a flat bed without using bedrails?: A Little Help needed moving to and from a bed to a chair (including a wheelchair)?: A Little Help needed standing up from a chair using your arms (e.g., wheelchair or bedside chair)?: A Little Help needed to walk in hospital room?: A Little Help needed climbing 3-5 steps with a railing? : A Little 6 Click Score: 19    End of Session Equipment Utilized During Treatment: Gait belt Activity Tolerance: Patient tolerated treatment well;No increased pain Patient left: in bed;in CPM;with call bell/phone within reach;with bed  alarm set;with family/visitor present;with SCD's reapplied Nurse Communication: Mobility status PT Visit Diagnosis: Pain;Difficulty in walking, not elsewhere classified (R26.2) Pain - Right/Left: Right Pain - part of body: Knee    Time: 3276-1470 PT Time Calculation (min) (ACUTE ONLY): 26 min   Charges:   PT Evaluation $PT Eval Low Complexity: 1 Low PT Treatments $Gait Training: 8-22 mins       Coolidge Breeze, PT, DPT [facility]  Department Office: [phone] Pager: [phone]  Coolidge Breeze 02/13/2022, 5:36 PM

## 2022-02-13 NOTE — Progress Notes (Signed)
Orthopedic Tech Progress Note Patient Details:  Shawn Bell 17-Jan-1941 [id number]  CPM Right Knee CPM Right Knee: On Right Knee Flexion (Degrees): 40 Right Knee Extension (Degrees): 10  Post Interventions Patient Tolerated: Well  Vernona Rieger 02/13/2022, 10:56 AM

## 2022-02-13 NOTE — Op Note (Signed)
OPERATIVE REPORT-TOTAL KNEE ARTHROPLASTY   Pre-operative diagnosis- Osteoarthritis  Right knee(s)  Post-operative diagnosis- Osteoarthritis Right knee(s)  Procedure-  Right  Total Knee Arthroplasty  Surgeon- Dione Plover. Archie Atilano, MD  Assistant- Jaynie Bream, PA-C   Anesthesia-   Adductor canal block and spinal  EBL-25 ml   Drains None  Tourniquet time-  Total Tourniquet Time Documented: Thigh (Right) - 48 minutes Total: Thigh (Right) - 48 minutes     Complications- None  Condition-PACU - hemodynamically stable.   Brief Clinical Note  Shawn Bell is a 81 y.o. year old male with end stage OA of his right knee with progressively worsening pain and dysfunction. He has constant pain, with activity and at rest and significant functional deficits with difficulties even with ADLs. He has had extensive non-op management including analgesics, injections of cortisone and viscosupplements, and home exercise program, but remains in significant pain with significant dysfunction. Radiographs show bone on bone arthritis medial and patellofemoral. He presents now for right Total Knee Arthroplasty.     Procedure in detail---   The patient is brought into the operating room and positioned supine on the operating table. After successful administration of  Adductor canal block and spinal,   a tourniquet is placed high on the  Right thigh(s) and the lower extremity is prepped and draped in the usual sterile fashion. Time out is performed by the operating team and then the  Right lower extremity is wrapped in Esmarch, knee flexed and the tourniquet inflated to 300 mmHg.       A midline incision is made with a ten blade through the subcutaneous tissue to the level of the extensor mechanism. A fresh blade is used to make a medial parapatellar arthrotomy. Soft tissue over the proximal medial tibia is subperiosteally elevated to the joint line with a knife and into the semimembranosus bursa with a Cobb  elevator. Soft tissue over the proximal lateral tibia is elevated with attention being paid to avoiding the patellar tendon on the tibial tubercle. The patella is everted, knee flexed 90 degrees and the ACL and PCL are removed. Findings are bone on bone medial and patellofemoral with large global osteophytes        The drill is used to create a starting hole in the distal femur and the canal is thoroughly irrigated with sterile saline to remove the fatty contents. The 5 degree Right  valgus alignment guide is placed into the femoral canal and the distal femoral cutting block is pinned to remove 9 mm off the distal femur. Resection is made with an oscillating saw.      The tibia is subluxed forward and the menisci are removed. The extramedullary alignment guide is placed referencing proximally at the medial aspect of the tibial tubercle and distally along the second metatarsal axis and tibial crest. The block is pinned to remove 60m off the more deficient medial  side. Resection is made with an oscillating saw. Size 8is the most appropriate size for the tibia and the proximal tibia is prepared with the modular drill and keel punch for that size.      The femoral sizing guide is placed and size 8 is most appropriate. Rotation is marked off the epicondylar axis and confirmed by creating a rectangular flexion gap at 90 degrees. The size 8 cutting block is pinned in this rotation and the anterior, posterior and chamfer cuts are made with the oscillating saw. The intercondylar block is then placed and that cut is  made.      Trial size 8 tibial component, trial size 8 posterior stabilized femur and a 10  mm posterior stabilized rotating platform insert trial is placed. Full extension is achieved with excellent varus/valgus and anterior/posterior balance throughout full range of motion. The patella is everted and thickness measured to be 24  mm. Free hand resection is taken to 14 mm, a 38 template is placed, lug holes  are drilled, trial patella is placed, and it tracks normally. Osteophytes are removed off the posterior femur with the trial in place. All trials are removed and the cut bone surfaces prepared with pulsatile lavage. Cement is mixed and once ready for implantation, the size 8 tibial implant, size  8 posterior stabilized femoral component, and the size 38 patella are cemented in place and the patella is held with the clamp. The trial insert is placed and the knee held in full extension. The Exparel (20 ml mixed with 60 ml saline) is injected into the extensor mechanism, posterior capsule, medial and lateral gutters and subcutaneous tissues.  All extruded cement is removed and once the cement is hard the permanent 10 mm posterior stabilized rotating platform insert is placed into the tibial tray.      The wound is copiously irrigated with saline solution and the extensor mechanism closed with # 0 Stratofix suture. The tourniquet is released for a total tourniquet time of 48  minutes. Flexion against gravity is 140 degrees and the patella tracks normally. Subcutaneous tissue is closed with 2.0 vicryl and subcuticular with running 4.0 Monocryl. The incision is cleaned and dried and steri-strips and a bulky sterile dressing are applied. The limb is placed into a knee immobilizer and the patient is awakened and transported to recovery in stable condition.      Please note that a surgical assistant was a medical necessity for this procedure in order to perform it in a safe and expeditious manner. Surgical assistant was necessary to retract the ligaments and vital neurovascular structures to prevent injury to them and also necessary for proper positioning of the limb to allow for anatomic placement of the prosthesis.   Dione Plover Jeanae Whitmill, MD    02/13/2022, 9:44 AM

## 2022-02-13 NOTE — Discharge Instructions (Addendum)
 Frank Aluisio, MD Total Joint Specialist [facility] [address] [place], [place] [address] [phone]  TOTAL KNEE REPLACEMENT POSTOPERATIVE DIRECTIONS    Knee Rehabilitation, Guidelines Following Surgery  Results after knee surgery are often greatly improved when you follow the exercise, range of motion and muscle strengthening exercises prescribed by your doctor. Safety measures are also important to protect the knee from further injury. If any of these exercises cause you to have increased pain or swelling in your knee joint, decrease the amount until you are comfortable again and slowly increase them. If you have problems or questions, call your caregiver or physical therapist for advice.   BLOOD CLOT PREVENTION Take a 10 mg Xarelto once a day for three weeks following surgery. Then resume an 81 mg Aspirin once a day. You may resume your vitamins/supplements once you have discontinued the Xarelto. Do not take any NSAIDs (Advil, Aleve, Ibuprofen, Meloxicam, etc.) until you have discontinued the Xarelto.   HOME CARE INSTRUCTIONS  Remove items at home which could result in a fall. This includes throw rugs or furniture in walking pathways.  ICE to the affected knee as much as tolerated. Icing helps control swelling. If the swelling is well controlled you will be more comfortable and rehab easier. Continue to use ice on the knee for pain and swelling from surgery. You may notice swelling that will progress down to the foot and ankle. This is normal after surgery. Elevate the leg when you are not up walking on it.    Continue to use the breathing machine which will help keep your temperature down. It is common for your temperature to cycle up and down following surgery, especially at night when you are not up moving around and exerting yourself. The breathing machine keeps your lungs expanded and your temperature down. Do not place pillow under the operative  knee, focus on keeping the knee straight while resting  DIET You may resume your previous home diet once you are discharged from the hospital.  DRESSING / WOUND CARE / SHOWERING Keep your bulky bandage on for 2 days. On the third post-operative day you may remove the Ace bandage and gauze. There is a waterproof adhesive bandage on your skin which will stay in place until your first follow-up appointment. Once you remove this you will not need to place another bandage You may begin showering 3 days following surgery, but do not submerge the incision under water.  ACTIVITY For the first 5 days, the key is rest and control of pain and swelling Do your home exercises twice a day starting on post-operative day 3. On the days you go to physical therapy, just do the home exercises once that day. You should rest, ice and elevate the leg for 50 minutes out of every hour. Get up and walk/stretch for 10 minutes per hour. After 5 days you can increase your activity slowly as tolerated. Walk with your walker as instructed. Use the walker until you are comfortable transitioning to a cane. Walk with the cane in the opposite hand of the operative leg. You may discontinue the cane once you are comfortable and walking steadily. Avoid periods of inactivity such as sitting longer than an hour when not asleep. This helps prevent blood clots.  You may discontinue the knee immobilizer once you are able to perform a straight leg raise while lying down. You may resume a sexual relationship in one month or when given the OK by your   doctor.  You may return to work once you are cleared by your doctor.  Do not drive a car for 6 weeks or until released by your surgeon.  Do not drive while taking narcotics.  TED HOSE STOCKINGS Wear the elastic stockings on both legs for three weeks following surgery during the day. You may remove them at night for sleeping.  WEIGHT BEARING Weight bearing as tolerated with assist device  (walker, cane, etc) as directed, use it as long as suggested by your surgeon or therapist, typically at least 4-6 weeks.  POSTOPERATIVE CONSTIPATION PROTOCOL Constipation - defined medically as fewer than three stools per week and severe constipation as less than one stool per week.  One of the most common issues patients have following surgery is constipation.  Even if you have a regular bowel pattern at home, your normal regimen is likely to be disrupted due to multiple reasons following surgery.  Combination of anesthesia, postoperative narcotics, change in appetite and fluid intake all can affect your bowels.  In order to avoid complications following surgery, here are some recommendations in order to help you during your recovery period.  Colace (docusate) - Pick up an over-the-counter form of Colace or another stool softener and take twice a day as long as you are requiring postoperative pain medications.  Take with a full glass of water daily.  If you experience loose stools or diarrhea, hold the colace until you stool forms back up. If your symptoms do not get better within 1 week or if they get worse, check with your doctor. Dulcolax (bisacodyl) - Pick up over-the-counter and take as directed by the product packaging as needed to assist with the movement of your bowels.  Take with a full glass of water.  Use this product as needed if not relieved by Colace only.  MiraLax (polyethylene glycol) - Pick up over-the-counter to have on hand. MiraLax is a solution that will increase the amount of water in your bowels to assist with bowel movements.  Take as directed and can mix with a glass of water, juice, soda, coffee, or tea. Take if you go more than two days without a movement. Do not use MiraLax more than once per day. Call your doctor if you are still constipated or irregular after using this medication for 7 days in a row.  If you continue to have problems with postoperative constipation, please  contact the office for further assistance and recommendations.  If you experience "the worst abdominal pain ever" or develop nausea or vomiting, please contact the office immediatly for further recommendations for treatment.  ITCHING If you experience itching with your medications, try taking only a single pain pill, or even half a pain pill at a time.  You can also use Benadryl over the counter for itching or also to help with sleep.   MEDICATIONS See your medication summary on the "After Visit Summary" that the nursing staff will review with you prior to discharge.  You may have some home medications which will be placed on hold until you complete the course of blood thinner medication.  It is important for you to complete the blood thinner medication as prescribed by your surgeon.  Continue your approved medications as instructed at time of discharge.  PRECAUTIONS If you experience chest pain or shortness of breath - call 911 immediately for transfer to the hospital emergency department.  If you develop a fever greater that 101 F, purulent drainage from wound, increased redness or   drainage from wound, foul odor from the wound/dressing, or calf pain - CONTACT YOUR SURGEON.                                                   FOLLOW-UP APPOINTMENTS Make sure you keep all of your appointments after your operation with your surgeon and caregivers. You should call the office at the above phone number and make an appointment for approximately two weeks after the date of your surgery or on the date instructed by your surgeon outlined in the "After Visit Summary".  RANGE OF MOTION AND STRENGTHENING EXERCISES  Rehabilitation of the knee is important following a knee injury or an operation. After just a few days of immobilization, the muscles of the thigh which control the knee become weakened and shrink (atrophy). Knee exercises are designed to build up the tone and strength of the thigh muscles and to  improve knee motion. Often times heat used for twenty to thirty minutes before working out will loosen up your tissues and help with improving the range of motion but do not use heat for the first two weeks following surgery. These exercises can be done on a training (exercise) mat, on the floor, on a table or on a bed. Use what ever works the best and is most comfortable for you Knee exercises include:  Leg Lifts - While your knee is still immobilized in a splint or cast, you can do straight leg raises. Lift the leg to 60 degrees, hold for 3 sec, and slowly lower the leg. Repeat 10-20 times 2-3 times daily. Perform this exercise against resistance later as your knee gets better.  Quad and Hamstring Sets - Tighten up the muscle on the front of the thigh (Quad) and hold for 5-10 sec. Repeat this 10-20 times hourly. Hamstring sets are done by pushing the foot backward against an object and holding for 5-10 sec. Repeat as with quad sets.  Leg Slides: Lying on your back, slowly slide your foot toward your buttocks, bending your knee up off the floor (only go as far as is comfortable). Then slowly slide your foot back down until your leg is flat on the floor again. Angel Wings: Lying on your back spread your legs to the side as far apart as you can without causing discomfort.  A rehabilitation program following serious knee injuries can speed recovery and prevent re-injury in the future due to weakened muscles. Contact your doctor or a physical therapist for more information on knee rehabilitation.   POST-OPERATIVE OPIOID TAPER INSTRUCTIONS: It is important to wean off of your opioid medication as soon as possible. If you do not need pain medication after your surgery it is ok to stop day one. Opioids include: Codeine, Hydrocodone(Norco, Vicodin), Oxycodone(Percocet, oxycontin) and hydromorphone amongst others.  Long term and even short term use of opiods can cause: Increased pain  response Dependence Constipation Depression Respiratory depression And more.  Withdrawal symptoms can include Flu like symptoms Nausea, vomiting And more Techniques to manage these symptoms Hydrate well Eat regular healthy meals Stay active Use relaxation techniques(deep breathing, meditating, yoga) Do Not substitute Alcohol to help with tapering If you have been on opioids for less than two weeks and do not have pain than it is ok to stop all together.  Plan to wean off of opioids This plan  should start within one week post op of your joint replacement. Maintain the same interval or time between taking each dose and first decrease the dose.  Cut the total daily intake of opioids by one tablet each day Next start to increase the time between doses. The last dose that should be eliminated is the evening dose.   IF YOU ARE TRANSFERRED TO A SKILLED REHAB FACILITY If the patient is transferred to a skilled rehab facility following release from the hospital, a list of the current medications will be sent to the facility for the patient to continue.  When discharged from the skilled rehab facility, please have the facility set up the patient's Home Health Physical Therapy prior to being released. Also, the skilled facility will be responsible for providing the patient with their medications at time of release from the facility to include their pain medication, the muscle relaxants, and their blood thinner medication. If the patient is still at the rehab facility at time of the two week follow up appointment, the skilled rehab facility will also need to assist the patient in arranging follow up appointment in our office and any transportation needs.  MAKE SURE YOU:  Understand these instructions.  Get help right away if you are not doing well or get worse.   DENTAL ANTIBIOTICS:  In most cases prophylactic antibiotics for Dental procdeures after total joint surgery are not  necessary.  Exceptions are as follows:  1. History of prior total joint infection  2. Severely immunocompromised (Organ Transplant, cancer chemotherapy, Rheumatoid biologic medications such as Humera)  3. Poorly controlled diabetes (A1C &gt; 8.0, blood glucose over 200)  If you have one of these conditions, contact your surgeon for an antibiotic prescription, prior to your dental procedure.    Pick up stool softner and laxative for home use following surgery while on pain medications. Do not submerge incision under water. Please use good hand washing techniques while changing dressing each day. May shower starting three days after surgery. Please use a clean towel to pat the incision dry following showers. Continue to use ice for pain and swelling after surgery. Do not use any lotions or creams on the incision until instructed by your surgeon.   Information on my medicine - XARELTO (Rivaroxaban)   Why was Xarelto prescribed for you? Xarelto was prescribed for you to reduce the risk of blood clots forming after orthopedic surgery. The medical term for these abnormal blood clots is venous thromboembolism (VTE).  What do you need to know about xarelto ? Take your Xarelto ONCE DAILY at the same time every day. You may take it either with or without food.  If you have difficulty swallowing the tablet whole, you may crush it and mix in applesauce just prior to taking your dose.  Take Xarelto exactly as prescribed by your doctor and DO NOT stop taking Xarelto without talking to the doctor who prescribed the medication.  Stopping without other VTE prevention medication to take the place of Xarelto may increase your risk of developing a clot.  After discharge, you should have regular check-up appointments with your healthcare provider that is prescribing your Xarelto.    What do you do if you miss a dose? If you miss a dose, take it as soon as you remember on the same day then  continue your regularly scheduled once daily regimen the next day. Do not take two doses of Xarelto on the same day.   Important Safety Information A possible   side effect of Xarelto is bleeding. You should call your healthcare provider right away if you experience any of the following: Bleeding from an injury or your nose that does not stop. Unusual colored urine (red or dark brown) or unusual colored stools (red or black). Unusual bruising for unknown reasons. A serious fall or if you hit your head (even if there is no bleeding).  Some medicines may interact with Xarelto and might increase your risk of bleeding while on Xarelto. To help avoid this, consult your healthcare provider or pharmacist prior to using any new prescription or non-prescription medications, including herbals, vitamins, non-steroidal anti-inflammatory drugs (NSAIDs) and supplements.  This website has more information on Xarelto: www.xarelto.com.    

## 2022-02-13 NOTE — Anesthesia Postprocedure Evaluation (Signed)
Anesthesia Post Note  Patient: Shawn Bell  Procedure(s) Performed: TOTAL KNEE ARTHROPLASTY (Right: Knee)     Patient location during evaluation: PACU Anesthesia Type: Spinal and Regional Level of consciousness: oriented and awake and alert Pain management: pain level controlled Vital Signs Assessment: post-procedure vital signs reviewed and stable Respiratory status: spontaneous breathing and respiratory function stable Cardiovascular status: blood pressure returned to baseline and stable Postop Assessment: no headache, no backache, no apparent nausea or vomiting, spinal receding and patient able to bend at knees Anesthetic complications: no   No notable events documented.  Last Vitals:  Vitals:   02/13/22 1130 02/13/22 1200  BP: 113/76 127/77  Pulse: 77 71  Resp: 18 17  Temp: (!) 36.4 C   SpO2: 92% 95%    Last Pain:  Vitals:   02/13/22 1130  TempSrc:   PainSc: 0-No pain                 Antanette Richwine,W. EDMOND

## 2022-02-14 ENCOUNTER — Encounter (HOSPITAL_COMMUNITY): Payer: Self-pay | Admitting: Orthopedic Surgery

## 2022-02-14 DIAGNOSIS — I4891 Unspecified atrial fibrillation: Secondary | ICD-10-CM | POA: Diagnosis not present

## 2022-02-14 DIAGNOSIS — Z7901 Long term (current) use of anticoagulants: Secondary | ICD-10-CM | POA: Diagnosis not present

## 2022-02-14 DIAGNOSIS — M1711 Unilateral primary osteoarthritis, right knee: Secondary | ICD-10-CM | POA: Diagnosis not present

## 2022-02-14 DIAGNOSIS — Z96612 Presence of left artificial shoulder joint: Secondary | ICD-10-CM | POA: Diagnosis not present

## 2022-02-14 DIAGNOSIS — Z96611 Presence of right artificial shoulder joint: Secondary | ICD-10-CM | POA: Diagnosis not present

## 2022-02-14 DIAGNOSIS — Z79899 Other long term (current) drug therapy: Secondary | ICD-10-CM | POA: Diagnosis not present

## 2022-02-14 DIAGNOSIS — Z96643 Presence of artificial hip joint, bilateral: Secondary | ICD-10-CM | POA: Diagnosis not present

## 2022-02-14 DIAGNOSIS — Z96652 Presence of left artificial knee joint: Secondary | ICD-10-CM | POA: Diagnosis not present

## 2022-02-14 DIAGNOSIS — Z8546 Personal history of malignant neoplasm of prostate: Secondary | ICD-10-CM | POA: Diagnosis not present

## 2022-02-14 LAB — CBC
HCT: 33.9 % — ABNORMAL LOW (ref 39.0–52.0)
Hemoglobin: 11.3 g/dL — ABNORMAL LOW (ref 13.0–17.0)
MCH: 31.5 pg (ref 26.0–34.0)
MCHC: 33.3 g/dL (ref 30.0–36.0)
MCV: 94.4 fL (ref 80.0–100.0)
Platelets: 201 10*3/uL (ref 150–400)
RBC: 3.59 MIL/uL — ABNORMAL LOW (ref 4.22–5.81)
RDW: 13.3 % (ref 11.5–15.5)
WBC: 11.8 10*3/uL — ABNORMAL HIGH (ref 4.0–10.5)
nRBC: 0 % (ref 0.0–0.2)

## 2022-02-14 LAB — BASIC METABOLIC PANEL
Anion gap: 8 (ref 5–15)
BUN: 15 mg/dL (ref 8–23)
CO2: 23 mmol/L (ref 22–32)
Calcium: 8.5 mg/dL — ABNORMAL LOW (ref 8.9–10.3)
Chloride: 106 mmol/L (ref 98–111)
Creatinine, Ser: 1.01 mg/dL (ref 0.61–1.24)
GFR, Estimated: >60 mL/min (ref >60)
Glucose, Bld: 121 mg/dL — ABNORMAL HIGH (ref 70–99)
Potassium: 4.3 mmol/L (ref 3.5–5.1)
Sodium: 137 mmol/L (ref 135–145)

## 2022-02-14 MED ORDER — RIVAROXABAN 10 MG PO TABS: 10.0000 mg | ORAL_TABLET | Freq: Every day | ORAL | 0 refills | Status: AC

## 2022-02-14 MED ORDER — TRAMADOL HCL 50 MG PO TABS: 50.0000 mg | ORAL_TABLET | Freq: Four times a day (QID) | ORAL | 0 refills | Status: DC | PRN

## 2022-02-14 MED ORDER — OXYCODONE HCL 5 MG PO TABS: 5.0000 mg | ORAL_TABLET | Freq: Four times a day (QID) | ORAL | 0 refills | Status: DC | PRN

## 2022-02-14 MED ORDER — OXYCODONE HCL 5 MG PO TABS: 5.0000 mg | ORAL_TABLET | ORAL | 0 refills | Status: DC | PRN

## 2022-02-14 MED ORDER — METHOCARBAMOL 500 MG PO TABS: 500.0000 mg | ORAL_TABLET | Freq: Four times a day (QID) | ORAL | 0 refills | Status: DC | PRN

## 2022-02-14 NOTE — Progress Notes (Signed)
Subjective: 1 Day Post-Op Procedure(s) (LRB): TOTAL KNEE ARTHROPLASTY (Right) Patient seen in rounds by Dr. Wynelle Link. Patient is well, and has had no acute complaints or problems. Denies SOB or chest pain. Denies calf pain. Intermittent straight cath as needed. Patient reports pain as mild.  Worked with physical therapy yesterday and ambulated 15'. We will continue physical therapy today.  Objective: Vital signs in last 24 hours: Temp:  [96.8 F (36 C)-97.9 F (36.6 C)] 97.7 F (36.5 C) (08/29 0601) Pulse Rate:  [64-104] 64 (08/29 0601) Resp:  [16-21] 18 (08/29 0601) BP: (101-133)/(59-86) 109/59 (08/29 0601) SpO2:  [92 %-98 %] 98 % (08/29 0601)  Intake/Output from previous day:  Intake/Output Summary (Last 24 hours) at 02/14/2022 0727 Last data filed at 02/14/2022 0600 Gross per 24 hour  Intake 3465.7 ml  Output 2850 ml  Net 615.7 ml     Intake/Output this shift: No intake/output data recorded.  Labs: Recent Labs    02/14/22 0353  HGB 11.3*   Recent Labs    02/14/22 0353  WBC 11.8*  RBC 3.59*  HCT 33.9*  PLT 201   Recent Labs    02/14/22 0353  NA 137  K 4.3  CL 106  CO2 23  BUN 15  CREATININE 1.01  GLUCOSE 121*  CALCIUM 8.5*   No results for input(s): "LABPT", "INR" in the last 72 hours.  Exam: General - Patient is Alert and Oriented Extremity - Neurologically intact Neurovascular intact Sensation intact distally Dorsiflexion/Plantar flexion intact Dressing - dressing C/D/I Motor Function - intact, moving foot and toes well on exam.  Past Medical History:  Diagnosis Date   A-fib ([place])    Episode x1   Arthritis    shoulders   Diverticulosis of colon (without mention of hemorrhage)    Hemorrhoids    History of prostate cancer    S/P RADIATION [place]   History of radiation therapy    Hyperlipemia    Incomplete RBBB 12/2019   Intermittent self-catheterization of bladder    ongoing; has been self catherizing for 5 years without any issues     OA (osteoarthritis) of knee 10/31/2021   Personal history of colonic polyps 01/23/2003   pt denies   Prostate cancer ([place])    Sleep apnea    cpap    Assessment/Plan: 1 Day Post-Op Procedure(s) (LRB): TOTAL KNEE ARTHROPLASTY (Right) Principal Problem:   OA (osteoarthritis) of knee Active Problems:   Osteoarthritis of right knee  Estimated body mass index is 27.44 kg/m as calculated from the following:   Height as of this encounter: '6\' 1"'$  (1.854 m).   Weight as of this encounter: 94.3 kg. Advance diet Up with therapy D/C IV fluids  Patient's anticipated LOS is less than 2 midnights, meeting these requirements: - Lives within 1 hour of care - Has a competent adult at home to recover with post-op - NO history of  - Chronic pain requiring opioids  - Diabetes  - Coronary Artery Disease  - Heart failure  - Heart attack  - Stroke  - DVT/VTE  - Cardiac arrhythmia  - Respiratory Failure/COPD  - Renal failure  - Anemia  - Advanced Liver disease  DVT Prophylaxis - Xarelto Weight bearing as tolerated. Continue physical therapy.  Plan is to go Home after hospital stay. Expected discharge today pending progress with physical therapy. Scheduled for OPPT at [place]. Follow-up in clinic in 2 weeks.  The PDMP database was reviewed today prior to any opioid medications  being prescribed to this patient.  R. Jaynie Bream, PA-C Orthopedic Surgery [phone] 02/14/2022, 7:27 AM

## 2022-02-14 NOTE — Discharge Summary (Signed)
Physician Discharge Summary   Patient ID: Shawn Bell MRN: [id number] DOB/AGE: 83-20-1942 83 y.o.  Admit date: 02/13/2022 Discharge date: 02/14/2022  Primary Diagnosis: Osteoarthritis, right knee   Admission Diagnoses:  Past Medical History:  Diagnosis Date   A-fib [facility])    Episode x1   Arthritis    shoulders   Diverticulosis of colon (without mention of hemorrhage)    Hemorrhoids    History of prostate cancer    S/P RADIATION [place]   History of radiation therapy    Hyperlipemia    Incomplete RBBB 12/2019   Intermittent self-catheterization of bladder    ongoing; has been self catherizing for 5 years without any issues    OA (osteoarthritis) of knee 10/31/2021   Personal history of colonic polyps 01/23/2003   pt denies   Prostate cancer ([place])    Sleep apnea    cpap   Discharge Diagnoses:   Principal Problem:   OA (osteoarthritis) of knee Active Problems:   Osteoarthritis of right knee  Estimated body mass index is 27.44 kg/m as calculated from the following:   Height as of this encounter: '6\' 1"'$  (1.854 m).   Weight as of this encounter: 94.3 kg.  Procedure:  Procedure(s) (LRB): TOTAL KNEE ARTHROPLASTY (Right)   Consults: None  HPI: Shawn Bell is a 81 y.o. year old male with end stage OA of his right knee with progressively worsening pain and dysfunction. He has constant pain, with activity and at rest and significant functional deficits with difficulties even with ADLs. He has had extensive non-op management including analgesics, injections of cortisone and viscosupplements, and home exercise program, but remains in significant pain with significant dysfunction. Radiographs show bone on bone arthritis medial and patellofemoral. He presents now for right Total Knee Arthroplasty.  Laboratory Data: Admission on 02/13/2022, Discharged on 02/14/2022  Component Date Value Ref Range Status   WBC 02/14/2022 11.8 (H)  4.0 - 10.5 K/uL Final   RBC 02/14/2022 3.59 (L)   4.22 - 5.81 MIL/uL Final   Hemoglobin 02/14/2022 11.3 (L)  13.0 - 17.0 g/dL Final   HCT 02/14/2022 33.9 (L)  39.0 - 52.0 % Final   MCV 02/14/2022 94.4  80.0 - 100.0 fL Final   MCH 02/14/2022 31.5  26.0 - 34.0 pg Final   MCHC 02/14/2022 33.3  30.0 - 36.0 g/dL Final   RDW 02/14/2022 13.3  11.5 - 15.5 % Final   Platelets 02/14/2022 201  150 - 400 K/uL Final   nRBC 02/14/2022 0.0  0.0 - 0.2 % Final   Performed at [facility], [place] [address], [place], [place] [address]   Sodium 02/14/2022 137  135 - 145 mmol/L Final   Potassium 02/14/2022 4.3  3.5 - 5.1 mmol/L Final   Chloride 02/14/2022 106  98 - 111 mmol/L Final   CO2 02/14/2022 23  22 - 32 mmol/L Final   Glucose, Bld 02/14/2022 121 (H)  70 - 99 mg/dL Final   Glucose reference range applies only to samples taken after fasting for at least 8 hours.   BUN 02/14/2022 15  8 - 23 mg/dL Final   Creatinine, Ser 02/14/2022 1.01  0.61 - 1.24 mg/dL Final   Calcium 02/14/2022 8.5 (L)  8.9 - 10.3 mg/dL Final   GFR, Estimated 02/14/2022 >60  >60 mL/min Final   Comment: (NOTE) Calculated using the CKD-EPI Creatinine Equation (2021)    Anion gap 02/14/2022 8  5 - 15 Final   Performed at [facility]  [facility], [place] [address], [place], [place] [address]  Hospital Outpatient Visit on 02/07/2022  Component Date Value Ref Range Status   MRSA, PCR 02/07/2022 NEGATIVE  NEGATIVE Final   Staphylococcus aureus 02/07/2022 NEGATIVE  NEGATIVE Final   Comment: (NOTE) The Xpert SA Assay (FDA approved for NASAL specimens in patients 74 years of age and older), is one component of a comprehensive surveillance program. It is not intended to diagnose infection nor to guide or monitor treatment. Performed at [facility], [place] [address] [place], [place] [address]    WBC 02/07/2022 5.6  4.0 - 10.5 K/uL Final   RBC 02/07/2022 4.45  4.22 - 5.81 MIL/uL Final   Hemoglobin 02/07/2022 13.7  13.0 - 17.0 g/dL Final   HCT  02/07/2022 42.3  39.0 - 52.0 % Final   MCV 02/07/2022 95.1  80.0 - 100.0 fL Final   MCH 02/07/2022 30.8  26.0 - 34.0 pg Final   MCHC 02/07/2022 32.4  30.0 - 36.0 g/dL Final   RDW 02/07/2022 13.3  11.5 - 15.5 % Final   Platelets 02/07/2022 247  150 - 400 K/uL Final   nRBC 02/07/2022 0.0  0.0 - 0.2 % Final   Performed at [facility], [place] [address], [place], [place] [address]   Sodium 02/07/2022 140  135 - 145 mmol/L Final   Potassium 02/07/2022 4.3  3.5 - 5.1 mmol/L Final   Chloride 02/07/2022 108  98 - 111 mmol/L Final   CO2 02/07/2022 25  22 - 32 mmol/L Final   Glucose, Bld 02/07/2022 118 (H)  70 - 99 mg/dL Final   Glucose reference range applies only to samples taken after fasting for at least 8 hours.   BUN 02/07/2022 13  8 - 23 mg/dL Final   Creatinine, Ser 02/07/2022 1.15  0.61 - 1.24 mg/dL Final   Calcium 02/07/2022 9.5  8.9 - 10.3 mg/dL Final   GFR, Estimated 02/07/2022 >60  >60 mL/min Final   Comment: (NOTE) Calculated using the CKD-EPI Creatinine Equation (2021)    Anion gap 02/07/2022 7  5 - 15 Final   Performed at [facility], [place] [address], [place], [place] [address]     X-Rays:No results found.  EKG: Orders placed or performed in visit on 08/12/21   EKG 12-Lead     Hospital Course: Shawn Bell is a 81 y.o. who was admitted to [facility]. They were brought to the operating room on 02/13/2022 and underwent Procedure(s): TOTAL KNEE ARTHROPLASTY.  Patient tolerated the procedure well and was later transferred to the recovery room and then to the orthopaedic floor for postoperative care. They were given PO and IV analgesics for pain control following their surgery. They were given 24 hours of postoperative antibiotics of  Anti-infectives (From admission, onward)    Start     Dose/Rate Route Frequency Ordered Stop   02/13/22 1400  ceFAZolin (ANCEF) IVPB 2g/100 mL premix        2 g 200 mL/hr over 30 Minutes Intravenous  Every 6 hours 02/13/22 1237 02/13/22 2054   02/13/22 0600  ceFAZolin (ANCEF) IVPB 2g/100 mL premix        2 g 200 mL/hr over 30 Minutes Intravenous On call to O.R. 02/13/22 2706 02/13/22 2376      and started on DVT prophylaxis in the form of Xarelto.   PT and OT were ordered for total joint protocol. Discharge planning consulted to help with postop disposition and  equipment needs.  Patient had a good night on the evening of surgery. They started to get up OOB with therapy on POD #0. Pt was seen during rounds and was ready to go home pending progress with therapy. He worked with therapy on POD #1 and was meeting his goals. Pt was discharged to home later that day in stable condition.  Diet: Regular diet Activity: WBAT Follow-up: in 2 weeks Disposition: Home Discharged Condition: stable   Discharge Instructions     Call MD / Call 911   Complete by: As directed    If you experience chest pain or shortness of breath, CALL 911 and be transported to the hospital emergency room.  If you develope a fever above 101 F, pus (white drainage) or increased drainage or redness at the wound, or calf pain, call your surgeon's office.   Change dressing   Complete by: As directed    You may remove the bulky bandage (ACE wrap and gauze) two days after surgery. You will have an adhesive waterproof bandage underneath. Leave this in place until your first follow-up appointment.   Constipation Prevention   Complete by: As directed    Drink plenty of fluids.  Prune juice may be helpful.  You may use a stool softener, such as Colace (over the counter) 100 mg twice a day.  Use MiraLax (over the counter) for constipation as needed.   Diet - low sodium heart healthy   Complete by: As directed    Do not put a pillow under the knee. Place it under the heel.   Complete by: As directed    Driving restrictions   Complete by: As directed    No driving for two weeks   Post-operative opioid taper instructions:    Complete by: As directed    POST-OPERATIVE OPIOID TAPER INSTRUCTIONS: It is important to wean off of your opioid medication as soon as possible. If you do not need pain medication after your surgery it is ok to stop day one. Opioids include: Codeine, Hydrocodone(Norco, Vicodin), Oxycodone(Percocet, oxycontin) and hydromorphone amongst others.  Long term and even short term use of opiods can cause: Increased pain response Dependence Constipation Depression Respiratory depression And more.  Withdrawal symptoms can include Flu like symptoms Nausea, vomiting And more Techniques to manage these symptoms Hydrate well Eat regular healthy meals Stay active Use relaxation techniques(deep breathing, meditating, yoga) Do Not substitute Alcohol to help with tapering If you have been on opioids for less than two weeks and do not have pain than it is ok to stop all together.  Plan to wean off of opioids This plan should start within one week post op of your joint replacement. Maintain the same interval or time between taking each dose and first decrease the dose.  Cut the total daily intake of opioids by one tablet each day Next start to increase the time between doses. The last dose that should be eliminated is the evening dose.      TED hose   Complete by: As directed    Use stockings (TED hose) for three weeks on both leg(s).  You may remove them at night for sleeping.   Weight bearing as tolerated   Complete by: As directed       Allergies as of 02/14/2022   No Known Allergies      Medication List     TAKE these medications    CoQ10 100 MG Caps Take 100 mg by mouth daily.  magnesium oxide 400 (240 Mg) MG tablet Commonly known as: MAG-OX Take 400 mg by mouth daily.   methocarbamol 500 MG tablet Commonly known as: ROBAXIN Take 1 tablet (500 mg total) by mouth every 6 (six) hours as needed for muscle spasms.   multivitamin with minerals tablet Take 1 tablet by mouth  daily.   oxyCODONE 5 MG immediate release tablet Commonly known as: Oxy IR/ROXICODONE Take 1-2 tablets (5-10 mg total) by mouth every 6 (six) hours as needed for severe pain.   PROBIOTIC-PREBIOTIC PO Take 1 capsule by mouth daily.   rivaroxaban 10 MG Tabs tablet Commonly known as: XARELTO Take 1 tablet (10 mg total) by mouth daily with breakfast for 20 days. Then take one 81 mg aspirin once a day for 3 weeks. Then discontinue.   rosuvastatin 5 MG tablet Commonly known as: Crestor Take 1 tablet (5 mg total) by mouth daily. Needs office visit and labs-2nd notice What changed:  when to take this additional instructions   traMADol 50 MG tablet Commonly known as: ULTRAM Take 1-2 tablets (50-100 mg total) by mouth every 6 (six) hours as needed for moderate pain.   zinc gluconate 50 MG tablet Take 50 mg by mouth daily.   zolpidem 5 MG tablet Commonly known as: AMBIEN Take 5 mg by mouth at bedtime as needed for sleep.               Discharge Care Instructions  (From admission, onward)           Start     Ordered   02/14/22 0000  Weight bearing as tolerated        02/14/22 0734   02/14/22 0000  Change dressing       Comments: You may remove the bulky bandage (ACE wrap and gauze) two days after surgery. You will have an adhesive waterproof bandage underneath. Leave this in place until your first follow-up appointment.   02/14/22 0734            Follow-up Information     Gaynelle Arabian, MD. Schedule an appointment as soon as possible for a visit in 2 week(s).   Specialty: Orthopedic Surgery Contact information: [address] [place] [address] [phone]                 Signed: R. Jaynie Bream, PA-C Orthopedic Surgery 02/14/2022, 11:57 AM

## 2022-02-14 NOTE — Progress Notes (Signed)
Physical Therapy Treatment Patient Details Name: Shawn Bell MRN: [id number] DOB: 03-Jan-1941 Today's Date: 02/14/2022   History of Present Illness Pt is an 81yo male presenting s/p R-TKA on 02/13/22. PMH: afib, hx of prostate CA s/p radiation, HLD, incomplete RBBB, OSA on CPAP, b/l reverse TSA, L-TKA 10/31/2021, b/l THA.    PT Comments    Pt progressing well this session. Areas of mobility noted below. Pt is ready to d/c with assist of his staff as needed from PT standpoint.    Recommendations for follow up therapy are one component of a multi-disciplinary discharge planning process, led by the attending physician.  Recommendations may be updated based on patient status, additional functional criteria and insurance authorization.  Follow Up Recommendations  Follow physician's recommendations for discharge plan and follow up therapies     Assistance Recommended at Discharge Set up Supervision/Assistance  Patient can return home with the following A little help with walking and/or transfers;A little help with bathing/dressing/bathroom;Assistance with cooking/housework;Assist for transportation;Help with stairs or ramp for entrance   Equipment Recommendations  None recommended by PT    Recommendations for Other Services       Precautions / Restrictions Precautions Precautions: Knee;Fall Restrictions Weight Bearing Restrictions: No Other Position/Activity Restrictions: WBAT     Mobility  Bed Mobility Overal bed mobility: Needs Assistance Bed Mobility: Supine to Sit     Supine to sit: Supervision     General bed mobility comments: For safety only, no physical assist required. Pt able to advance LEs off EOB and elevate trunk.    Transfers Overall transfer level: Needs assistance Equipment used: Rolling walker (2 wheels) Transfers: Sit to/from Stand Sit to Stand: Min guard           General transfer comment: For safety only, no physical assist required, cues for hand  placement    Ambulation/Gait Ambulation/Gait assistance: Min guard Gait Distance (Feet): 80 Feet Assistive device: Rolling walker (2 wheels) Gait Pattern/deviations: Step-through pattern, Decreased stance time - right Gait velocity: decreased     General Gait Details: Pt ambulated with RW and min guard, no knee buckling,  no physical assist required or overt LOB noted. verbal cues for safety with RW during turns   Stairs             Wheelchair Mobility    Modified Rankin (Stroke Patients Only)       Balance                                            Cognition Arousal/Alertness: Awake/alert Behavior During Therapy: WFL for tasks assessed/performed Overall Cognitive Status: Within Functional Limits for tasks assessed                                          Exercises Total Joint Exercises Ankle Circles/Pumps: AROM, Both, 10 reps, Supine Quad Sets: AROM, Both, 5 reps Heel Slides: AAROM, Right, 10 reps Straight Leg Raises: AROM, Right, 5 reps Long Arc Quad: AROM, Both, Seated, 10 reps    General Comments        Pertinent Vitals/Pain Pain Assessment Pain Assessment: 0-10 Pain Score: 4  Pain Location: Right knee Pain Descriptors / Indicators: Sore Pain Intervention(s): Limited activity within patient's tolerance, Monitored during session, Premedicated before session, Repositioned, Ice  applied    Home Living                          Prior Function            PT Goals (current goals can now be found in the care plan section) Acute Rehab PT Goals Patient Stated Goal: To walk without pain PT Goal Formulation: With patient Time For Goal Achievement: 02/20/22 Potential to Achieve Goals: Good Progress towards PT goals: Progressing toward goals    Frequency    7X/week      PT Plan Current plan remains appropriate    Co-evaluation              AM-PAC PT "6 Clicks" Mobility   Outcome Measure   Help needed turning from your back to your side while in a flat bed without using bedrails?: None Help needed moving from lying on your back to sitting on the side of a flat bed without using bedrails?: None Help needed moving to and from a bed to a chair (including a wheelchair)?: A Little Help needed standing up from a chair using your arms (e.g., wheelchair or bedside chair)?: A Little Help needed to walk in hospital room?: A Little Help needed climbing 3-5 steps with a railing? : A Little 6 Click Score: 20    End of Session Equipment Utilized During Treatment: Gait belt Activity Tolerance: Patient tolerated treatment well;No increased pain Patient left: in chair;with call bell/phone within reach;with chair alarm set Nurse Communication: Mobility status PT Visit Diagnosis: Pain;Difficulty in walking, not elsewhere classified (R26.2) Pain - Right/Left: Right Pain - part of body: Knee     Time: 1043-1106 PT Time Calculation (min) (ACUTE ONLY): 23 min  Charges:  $Gait Training: 8-22 mins $Therapeutic Exercise: 8-22 mins                     Baxter Flattery, PT  Acute Rehab Dept [facility]) [phone]  [facility] Weekend Pager ([place] only)  [phone]  02/14/2022    [facility] 02/14/2022, 11:18 AM

## 2022-02-14 NOTE — Plan of Care (Signed)
Pt ready to go home.

## 2022-02-14 NOTE — TOC Transition Note (Signed)
Transition of Care (TOC) - CM/SW Discharge Note   Patient Details  Name: Durwood D Mascari MRN: [id number] Date of Birth: 10/01/1940  Transition of Care (TOC) CM/SW Contact:  HOYLE, LUCY, LCSW Phone Number: 02/14/2022, 9:31 AM   Clinical Narrative:     Met with pt and confirming he has all needed DME at home.  OPPT already arranged with [facility].  No TOC needs.  Final next level of care: OP Rehab Barriers to Discharge: No Barriers Identified   Patient Goals and CMS Choice Patient states their goals for this hospitalization and ongoing recovery are:: return home      Discharge Placement                       Discharge Plan and Services                DME Arranged: N/A DME Agency: NA                  Social Determinants of Health (SDOH) Interventions     Readmission Risk Interventions     No data to display             

## 2022-02-17 DIAGNOSIS — M25561 Pain in right knee: Secondary | ICD-10-CM | POA: Diagnosis not present

## 2022-02-23 DIAGNOSIS — M25561 Pain in right knee: Secondary | ICD-10-CM | POA: Diagnosis not present

## 2022-02-27 DIAGNOSIS — M25561 Pain in right knee: Secondary | ICD-10-CM | POA: Diagnosis not present

## 2022-03-01 DIAGNOSIS — M25561 Pain in right knee: Secondary | ICD-10-CM | POA: Diagnosis not present

## 2022-03-03 DIAGNOSIS — M25561 Pain in right knee: Secondary | ICD-10-CM | POA: Diagnosis not present

## 2022-03-06 DIAGNOSIS — M25561 Pain in right knee: Secondary | ICD-10-CM | POA: Diagnosis not present

## 2022-03-08 DIAGNOSIS — M25561 Pain in right knee: Secondary | ICD-10-CM | POA: Diagnosis not present

## 2022-03-10 DIAGNOSIS — M25561 Pain in right knee: Secondary | ICD-10-CM | POA: Diagnosis not present

## 2022-03-13 DIAGNOSIS — M25561 Pain in right knee: Secondary | ICD-10-CM | POA: Diagnosis not present

## 2022-03-15 DIAGNOSIS — M25561 Pain in right knee: Secondary | ICD-10-CM | POA: Diagnosis not present

## 2022-03-17 DIAGNOSIS — M25561 Pain in right knee: Secondary | ICD-10-CM | POA: Diagnosis not present

## 2022-03-20 DIAGNOSIS — M25561 Pain in right knee: Secondary | ICD-10-CM | POA: Diagnosis not present

## 2022-03-22 DIAGNOSIS — M25561 Pain in right knee: Secondary | ICD-10-CM | POA: Diagnosis not present

## 2022-03-24 DIAGNOSIS — M25561 Pain in right knee: Secondary | ICD-10-CM | POA: Diagnosis not present

## 2022-03-27 DIAGNOSIS — M25561 Pain in right knee: Secondary | ICD-10-CM | POA: Diagnosis not present

## 2022-03-28 DIAGNOSIS — Z5189 Encounter for other specified aftercare: Secondary | ICD-10-CM | POA: Diagnosis not present

## 2022-03-29 DIAGNOSIS — M25561 Pain in right knee: Secondary | ICD-10-CM | POA: Diagnosis not present

## 2022-04-03 DIAGNOSIS — M25561 Pain in right knee: Secondary | ICD-10-CM | POA: Diagnosis not present

## 2022-04-05 DIAGNOSIS — M25561 Pain in right knee: Secondary | ICD-10-CM | POA: Diagnosis not present

## 2022-05-16 ENCOUNTER — Telehealth: Payer: Self-pay | Admitting: *Deleted

## 2022-05-16 NOTE — Patient Outreach (Signed)
  Care Coordination   05/16/2022 Name: Shawn Bell MRN: [id number] DOB: [birth year]   Care Coordination Outreach Attempts:  An unsuccessful telephone outreach was attempted today to offer the patient information about available care coordination services as a benefit of their health plan.   Follow Up Plan:  Additional outreach attempts will be made to offer the patient care coordination information and services.   Encounter Outcome:  No Answer   Care Coordination Interventions:  No, not indicated    Eduard Clos MSW, LCSW Licensed Clinical Social Worker      [phone]

## 2022-06-28 DIAGNOSIS — H26492 Other secondary cataract, left eye: Secondary | ICD-10-CM | POA: Diagnosis not present

## 2022-08-07 ENCOUNTER — Encounter: Payer: Self-pay | Admitting: Cardiology

## 2022-08-07 NOTE — Progress Notes (Signed)
Labs 08/03/2022:  Serum glucose 89 mg, BUN 15, creatinine 1.16, EGFR 67, potassium 4.5, LFTs normal.  Magnesium 2.3.  Hb 14.7/HCT 43.8, platelets 257, normal indicis.  Total cholesterol 152, triglycerides 103, HDL 42, LDL 91.  TSH normal at 3.150.  Vitamin D 85.

## 2022-08-28 DIAGNOSIS — Z961 Presence of intraocular lens: Secondary | ICD-10-CM | POA: Diagnosis not present

## 2022-08-28 DIAGNOSIS — H26492 Other secondary cataract, left eye: Secondary | ICD-10-CM | POA: Diagnosis not present

## 2022-10-09 DIAGNOSIS — Z961 Presence of intraocular lens: Secondary | ICD-10-CM | POA: Diagnosis not present

## 2022-10-09 DIAGNOSIS — Z9889 Other specified postprocedural states: Secondary | ICD-10-CM | POA: Diagnosis not present

## 2022-10-12 DIAGNOSIS — L918 Other hypertrophic disorders of the skin: Secondary | ICD-10-CM | POA: Diagnosis not present

## 2022-10-12 DIAGNOSIS — L570 Actinic keratosis: Secondary | ICD-10-CM | POA: Diagnosis not present

## 2022-10-12 DIAGNOSIS — L821 Other seborrheic keratosis: Secondary | ICD-10-CM | POA: Diagnosis not present

## 2022-11-18 ENCOUNTER — Encounter: Payer: Self-pay | Admitting: Cardiology

## 2022-11-18 MED ORDER — AMLODIPINE BESYLATE 5 MG PO TABS: 5.0000 mg | ORAL_TABLET | Freq: Every day | ORAL | 3 refills | Status: DC

## 2022-11-18 NOTE — Progress Notes (Unsigned)
Patient called with BP 167/78. Sent amlodipine 5 mg

## 2022-11-20 ENCOUNTER — Other Ambulatory Visit: Payer: Self-pay | Admitting: Internal Medicine

## 2022-11-20 DIAGNOSIS — R519 Headache, unspecified: Secondary | ICD-10-CM | POA: Diagnosis not present

## 2022-11-20 DIAGNOSIS — G44209 Tension-type headache, unspecified, not intractable: Secondary | ICD-10-CM

## 2022-11-30 DIAGNOSIS — C61 Malignant neoplasm of prostate: Secondary | ICD-10-CM | POA: Diagnosis not present

## 2022-11-30 DIAGNOSIS — R339 Retention of urine, unspecified: Secondary | ICD-10-CM | POA: Diagnosis not present

## 2022-11-30 DIAGNOSIS — N319 Neuromuscular dysfunction of bladder, unspecified: Secondary | ICD-10-CM | POA: Diagnosis not present

## 2023-12-01 LAB — COLOGUARD: COLOGUARD: NEGATIVE

## 2024-07-25 ENCOUNTER — Ambulatory Visit: Admitting: Cardiology

## 2024-07-25 VITALS — BP 110/60 | HR 74 | Resp 95 | Ht 73.0 in | Wt 199.0 lb

## 2024-07-25 DIAGNOSIS — I493 Ventricular premature depolarization: Secondary | ICD-10-CM

## 2024-07-25 DIAGNOSIS — E7800 Pure hypercholesterolemia, unspecified: Secondary | ICD-10-CM

## 2024-07-25 DIAGNOSIS — Z8679 Personal history of other diseases of the circulatory system: Secondary | ICD-10-CM

## 2024-07-25 NOTE — Patient Instructions (Signed)
 Medication Instructions:  Your physician recommends that you continue on your current medications as directed. Please refer to the Current Medication list given to you today.  *If you need a refill on your cardiac medications before your next appointment, please call your pharmacy*   Follow-Up: At [facility], you and your health needs are our priority.  As part of our continuing mission to provide you with exceptional heart care, our providers are all part of one team.  This team includes your primary Cardiologist (physician) and Advanced Practice Providers or APPs (Physician Assistants and Nurse Practitioners) who all work together to provide you with the care you need, when you need it.  Your next appointment:    As Needed  Provider:   Gordy Bergamo, MD

## 2024-07-25 NOTE — Progress Notes (Unsigned)
 " Cardiology Office Note:  .   Date:  07/25/2024  ID:  Shawn Bell, DOB 1941-01-11, MRN [id number] PCP: Bell Shawn Jean, FNP  [facility] Providers Cardiologist:  None { Click to update primary MD,subspecialty MD or APP then REFRESH:1}  History of Present Illness: .   Shawn Bell is a 84 y.o.  hyperlipidemia, history of prostate cancer status post radiation 20 years ago, paroxysmal atrial fibrillation (diagnosed 02/2019).  Patient's Apple Watch alerted him to recurrence of atrial fibrillation 08/11/2021 which lasted for approximately 2 hours with a maximum heart rate of 130 bpm. Patient notes this appeared to have been triggered by dehydration and consuming alcoholic beverage, he was also being treated for sinus infection at that time.  He contacted me stating that he had atrial fibrillation 2 days ago that lasted for about 2 hours and finally settled down by itself.  He had EKG done this morning by his PCP which revealed sinus bradycardia and single PVC.  He wanted to see me to discuss further.  Labs 07/25/2024:  Serum glucose 83 mg, BUN 12, creatinine 1.13, eGFR 63 mL, potassium 4.8, LFTs normal.     Patient has previously been recommended anticoagulation given paroxysmal atrial fibrillation and CHA2DS2-VASc score of 2.  However despite recognition of the benefits patient has opted to hold off on anticoagulation and preferred to only be on low-dose aspirin .    Discussed the use of AI scribe software for clinical note transcription with the patient, who gave verbal consent to proceed.  History of Present Illness Shawn Bell is an 84 year old male with atrial fibrillation who presents with episodes of irregular heartbeats detected by his smartwatch.  He has smartwatch-detected episodes of irregular heartbeats that he notes occurred after heavy intake of chocolate, sweets, and a large caffeinated coffee, during which his watch documented atrial fibrillation while he  was resting.  He recalls a prior AF episode after a large alcohol intake with poor hydration, and another episode attributed to an online supplement that he has since stopped.  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 He remains active, exercises regularly, and frequently checks his heart rate with his smartwatch.  He takes Crestor  5 mg and fish oil, and also uses CoQ10, zinc, and a multivitamin. He stopped vitamin B12 and magnesium  after high blood levels were found.  Cardiac Studies relevent.    Cardiac Studies & Procedures   ______________________________________________________________________________________________   STRESS TESTS  PCV MYOCARDIAL PERFUSION WITH LEXISCAN  06/02/2019  Narrative Lexiscan  Tetrofosmin  Stress Test  06/02/2019: Nondiagnostic ECG stress due to Lexiscan  pharmacologic stress. Normal myocardial perfusion. All segments of left ventricle demonstrated normal wall motion and thickening. No stress lung uptake. Calculated Stress LV EF is mildly dysfunctional 48%. however appears normal visually. No previous exam available for comparison. Low risk study.   ECHOCARDIOGRAM  PCV ECHOCARDIOGRAM COMPLETE 04/01/2019  Narrative Echocardiogram 04/01/2019: Left ventricle cavity is normal in size. Mild concentric hypertrophy of the left ventricle. Normal LV systolic function with EF 60%. Normal global wall motion. Doppler evidence of grade I (impaired) diastolic dysfunction, normal LAP. Left atrial cavity is normal in size. Trileaflet aortic valve.  Trace aortic valve stenosis. Moderate (Grade II) aortic regurgitation. Mild tricuspid regurgitation. Trace pulmonic regurgitation. No evidence of pulmonary hypertension.    MONITORS  CARDIAC EVENT MONITOR 04/01/2019  Narrative 30-day event monitor 10/13-11/04/2019: Normal sinus rhythm. 1 patient activated event without reported symptoms correlated with sinus  rhythm with PVCs. 2 auto detected events occurred. One episode on 10/26 at 1221 AM for normal sinus rhythm with 4 beats of NSVT. No symptoms. 1 episode of sinus bradycardia with 4 beats of atrial tachycardia at 165 bpm on day 12 at 11:18 PM. No atrial fibrillation was noted.       ______________________________________________________________________________________________     EKG:      Labs   Care everywhere/Faxed External Labs:  Labs 07/04/2024:  Serum glucose 88 mg, BUN 22, creatinine 1.17, eGFR 61 mL, potassium 4.2, LFTs normal.  Hb 14.4/HCT 43.7, platelets 223, normal indicis.  Total cholesterol 141, triglycerides 92, HDL 42, LDL 82.  A1c 5.3%.  TSH normal at 3.260.  Vitamin D normal at 59.9.  PSA normal at 0.4.  ROS  ***ROS Physical Exam:   VS:  BP 110/60 (BP Location: Right Arm, Patient Position: Sitting, Cuff Size: Normal)   Pulse 74   Resp (!) 95   Ht 6' 1 (1.854 m)   Wt 199 lb (90.3 kg)   BMI 26.25 kg/m    Wt Readings from Last 3 Encounters:  07/25/24 199 lb (90.3 kg)  02/13/22 208 lb (94.3 kg)  02/07/22 208 lb (94.3 kg)    BP Readings from Last 3 Encounters:  07/25/24 110/60  02/14/22 124/70  02/07/22 128/84   ***Physical Exam  ASSESSMENT AND PLAN: .      ICD-10-CM   1. History of atrial fibrillation  Z86.79 EKG 12-Lead    2. Palpitations  R00.2     3. Pure hypercholesterolemia  E78.00      Assessment and Plan Assessment & Plan Premature ventricular complexes Recent episodes of PVCs detected by smartwatch, initially misinterpreted as atrial fibrillation. No symptoms reported. Previous AFib episodes linked to alcohol and supplement intake. Current PVCs are benign and require no intervention. - Continue to monitor PVCs with smartwatch. - Consider fish oil capsules with meals. - Consider B1, B6, and B12 supplementation two to three times a week.  History of atrial fibrillation AFib episodes previously associated with alcohol and supplement  intake. No recent documented AFib episodes. Current PVCs are not AFib. - Continue to monitor for any recurrence of AFib symptoms.  Pure hypercholesterolemia Currently managed with Crestor  5 mg daily. - Continue Crestor  5 mg daily.   Follow up: ***  Signed,  Gordy Bergamo, MD, [facility] 07/25/2024, 4:19 PM [facility] [address] [place], [place] [address] Phone: [phone]. Fax:  [phone]  "
